# Patient Record
Sex: Female | Born: 1945
Health system: Southern US, Community
[De-identification: ages and names within clinical notes are randomized; demographics above are authoritative.]

## PROBLEM LIST (undated history)

## (undated) DIAGNOSIS — M8580 Other specified disorders of bone density and structure, unspecified site: Secondary | ICD-10-CM

## (undated) DIAGNOSIS — M1990 Unspecified osteoarthritis, unspecified site: Secondary | ICD-10-CM

## (undated) DIAGNOSIS — N649 Disorder of breast, unspecified: Secondary | ICD-10-CM

## (undated) DIAGNOSIS — D126 Benign neoplasm of colon, unspecified: Secondary | ICD-10-CM

## (undated) DIAGNOSIS — I341 Nonrheumatic mitral (valve) prolapse: Secondary | ICD-10-CM

## (undated) HISTORY — PX: TONSILLECTOMY: SUR1361

## (undated) HISTORY — PX: RECTOCELE REPAIR: SHX761

## (undated) HISTORY — DX: Disorder of breast, unspecified: N64.9

## (undated) HISTORY — PX: ANTERIOR AND POSTERIOR VAGINAL REPAIR: SUR5

## (undated) HISTORY — DX: Unspecified osteoarthritis, unspecified site: M19.90

## (undated) HISTORY — DX: Other specified disorders of bone density and structure, unspecified site: M85.80

## (undated) HISTORY — DX: Nonrheumatic mitral (valve) prolapse: I34.1

## (undated) HISTORY — DX: Benign neoplasm of colon, unspecified: D12.6

## (undated) HISTORY — PX: ABDOMINAL HYSTERECTOMY: SHX81

## (undated) HISTORY — PX: CATARACT EXTRACTION: SUR2

## (undated) HISTORY — PX: BREAST LUMPECTOMY: SHX2

---

## 1997-12-25 ENCOUNTER — Ambulatory Visit (HOSPITAL_COMMUNITY): Admission: RE | Admit: 1997-12-25 | Discharge: 1997-12-25 | Payer: Self-pay | Admitting: Internal Medicine

## 1998-11-30 ENCOUNTER — Encounter: Payer: Self-pay | Admitting: Internal Medicine

## 1998-11-30 ENCOUNTER — Ambulatory Visit (HOSPITAL_COMMUNITY): Admission: RE | Admit: 1998-11-30 | Discharge: 1998-11-30 | Payer: Self-pay | Admitting: Internal Medicine

## 1999-04-19 HISTORY — PX: TOTAL ABDOMINAL HYSTERECTOMY W/ BILATERAL SALPINGOOPHORECTOMY: SHX83

## 2000-04-18 HISTORY — PX: COLONOSCOPY: SHX174

## 2000-07-12 ENCOUNTER — Other Ambulatory Visit: Admission: RE | Admit: 2000-07-12 | Discharge: 2000-07-12 | Payer: Self-pay | Admitting: Family Medicine

## 2001-11-22 ENCOUNTER — Encounter: Payer: Self-pay | Admitting: Family Medicine

## 2001-11-22 ENCOUNTER — Encounter: Admission: RE | Admit: 2001-11-22 | Discharge: 2001-11-22 | Payer: Self-pay | Admitting: Family Medicine

## 2002-06-16 ENCOUNTER — Encounter: Payer: Self-pay | Admitting: Emergency Medicine

## 2002-06-16 ENCOUNTER — Emergency Department (HOSPITAL_COMMUNITY): Admission: EM | Admit: 2002-06-16 | Discharge: 2002-06-16 | Payer: Self-pay | Admitting: Emergency Medicine

## 2003-08-04 ENCOUNTER — Other Ambulatory Visit: Admission: RE | Admit: 2003-08-04 | Discharge: 2003-08-04 | Payer: Self-pay | Admitting: Obstetrics and Gynecology

## 2004-07-03 ENCOUNTER — Ambulatory Visit: Payer: Self-pay | Admitting: Family Medicine

## 2005-01-24 ENCOUNTER — Emergency Department (HOSPITAL_COMMUNITY): Admission: EM | Admit: 2005-01-24 | Discharge: 2005-01-24 | Payer: Self-pay | Admitting: Emergency Medicine

## 2005-04-08 ENCOUNTER — Ambulatory Visit: Payer: Self-pay | Admitting: Internal Medicine

## 2005-06-04 ENCOUNTER — Ambulatory Visit: Payer: Self-pay | Admitting: Family Medicine

## 2005-09-10 ENCOUNTER — Ambulatory Visit: Payer: Self-pay | Admitting: Family Medicine

## 2005-09-28 ENCOUNTER — Ambulatory Visit: Payer: Self-pay | Admitting: Internal Medicine

## 2006-02-11 ENCOUNTER — Ambulatory Visit: Payer: Self-pay | Admitting: Internal Medicine

## 2006-02-11 ENCOUNTER — Ambulatory Visit (HOSPITAL_COMMUNITY): Admission: RE | Admit: 2006-02-11 | Discharge: 2006-02-11 | Payer: Self-pay | Admitting: Internal Medicine

## 2006-02-27 ENCOUNTER — Ambulatory Visit: Payer: Self-pay | Admitting: Internal Medicine

## 2006-06-17 ENCOUNTER — Ambulatory Visit: Payer: Self-pay | Admitting: Internal Medicine

## 2006-11-27 ENCOUNTER — Encounter: Payer: Self-pay | Admitting: Internal Medicine

## 2006-12-20 ENCOUNTER — Ambulatory Visit: Payer: Self-pay | Admitting: Internal Medicine

## 2006-12-22 ENCOUNTER — Encounter (INDEPENDENT_AMBULATORY_CARE_PROVIDER_SITE_OTHER): Payer: Self-pay | Admitting: *Deleted

## 2006-12-22 LAB — CONVERTED CEMR LAB
AST: 18 units/L (ref 0–37)
Basophils Absolute: 0 10*3/uL (ref 0.0–0.1)
Bilirubin, Direct: 0.1 mg/dL (ref 0.0–0.3)
Cholesterol: 188 mg/dL (ref 0–200)
Eosinophils Absolute: 0.2 10*3/uL (ref 0.0–0.6)
Eosinophils Relative: 3.5 % (ref 0.0–5.0)
GFR calc Af Amer: 94 mL/min
GFR calc non Af Amer: 78 mL/min
Glucose, Bld: 87 mg/dL (ref 70–99)
HCT: 40.4 % (ref 36.0–46.0)
Lymphocytes Relative: 37.7 % (ref 12.0–46.0)
MCHC: 34.1 g/dL (ref 30.0–36.0)
MCV: 89.5 fL (ref 78.0–100.0)
Monocytes Absolute: 0.5 10*3/uL (ref 0.2–0.7)
Neutro Abs: 2.8 10*3/uL (ref 1.4–7.7)
Neutrophils Relative %: 49.9 % (ref 43.0–77.0)
Potassium: 4.1 meq/L (ref 3.5–5.1)
Sodium: 144 meq/L (ref 135–145)
TSH: 1.05 microintl units/mL (ref 0.35–5.50)
WBC: 5.6 10*3/uL (ref 4.5–10.5)

## 2007-03-23 ENCOUNTER — Ambulatory Visit: Payer: Self-pay | Admitting: Internal Medicine

## 2007-03-23 ENCOUNTER — Encounter (INDEPENDENT_AMBULATORY_CARE_PROVIDER_SITE_OTHER): Payer: Self-pay | Admitting: *Deleted

## 2007-04-09 ENCOUNTER — Ambulatory Visit: Payer: Self-pay | Admitting: Internal Medicine

## 2007-04-28 ENCOUNTER — Ambulatory Visit: Payer: Self-pay | Admitting: Family Medicine

## 2007-05-07 ENCOUNTER — Ambulatory Visit: Payer: Self-pay | Admitting: Internal Medicine

## 2007-11-14 ENCOUNTER — Ambulatory Visit: Payer: Self-pay | Admitting: Internal Medicine

## 2007-11-28 ENCOUNTER — Encounter: Payer: Self-pay | Admitting: Internal Medicine

## 2008-07-14 ENCOUNTER — Ambulatory Visit: Payer: Self-pay | Admitting: Internal Medicine

## 2008-10-09 ENCOUNTER — Telehealth (INDEPENDENT_AMBULATORY_CARE_PROVIDER_SITE_OTHER): Payer: Self-pay | Admitting: *Deleted

## 2008-11-03 ENCOUNTER — Ambulatory Visit: Payer: Self-pay | Admitting: Internal Medicine

## 2008-11-03 DIAGNOSIS — N959 Unspecified menopausal and perimenopausal disorder: Secondary | ICD-10-CM | POA: Insufficient documentation

## 2008-11-03 DIAGNOSIS — M1990 Unspecified osteoarthritis, unspecified site: Secondary | ICD-10-CM | POA: Insufficient documentation

## 2008-11-09 LAB — CONVERTED CEMR LAB
AST: 15 units/L (ref 0–37)
BUN: 20 mg/dL (ref 6–23)
Basophils Absolute: 0 10*3/uL (ref 0.0–0.1)
Cholesterol: 204 mg/dL — ABNORMAL HIGH (ref 0–200)
Creatinine, Ser: 0.7 mg/dL (ref 0.4–1.2)
Direct LDL: 118.1 mg/dL
GFR calc non Af Amer: 89.86 mL/min (ref >60)
Glucose, Bld: 81 mg/dL (ref 70–99)
HCT: 40.1 % (ref 36.0–46.0)
HDL: 71.3 mg/dL (ref >39.00)
Lymphs Abs: 2 10*3/uL (ref 0.7–4.0)
Monocytes Absolute: 0.4 10*3/uL (ref 0.1–1.0)
Monocytes Relative: 8.1 % (ref 3.0–12.0)
Platelets: 206 10*3/uL (ref 150.0–400.0)
Potassium: 3.7 meq/L (ref 3.5–5.1)
RDW: 13.1 % (ref 11.5–14.6)
TSH: 0.87 microintl units/mL (ref 0.35–5.50)
Total Bilirubin: 0.9 mg/dL (ref 0.3–1.2)
Triglycerides: 68 mg/dL (ref 0.0–149.0)

## 2008-11-11 ENCOUNTER — Encounter: Payer: Self-pay | Admitting: Internal Medicine

## 2008-11-14 ENCOUNTER — Telehealth (INDEPENDENT_AMBULATORY_CARE_PROVIDER_SITE_OTHER): Payer: Self-pay | Admitting: *Deleted

## 2008-11-17 ENCOUNTER — Encounter (INDEPENDENT_AMBULATORY_CARE_PROVIDER_SITE_OTHER): Payer: Self-pay | Admitting: *Deleted

## 2008-12-01 ENCOUNTER — Encounter: Payer: Self-pay | Admitting: Internal Medicine

## 2008-12-03 ENCOUNTER — Telehealth (INDEPENDENT_AMBULATORY_CARE_PROVIDER_SITE_OTHER): Payer: Self-pay | Admitting: *Deleted

## 2008-12-21 ENCOUNTER — Emergency Department (HOSPITAL_COMMUNITY): Admission: EM | Admit: 2008-12-21 | Discharge: 2008-12-21 | Payer: Self-pay | Admitting: Emergency Medicine

## 2008-12-23 ENCOUNTER — Telehealth (INDEPENDENT_AMBULATORY_CARE_PROVIDER_SITE_OTHER): Payer: Self-pay | Admitting: *Deleted

## 2009-04-08 ENCOUNTER — Ambulatory Visit: Payer: Self-pay | Admitting: Internal Medicine

## 2009-04-12 LAB — CONVERTED CEMR LAB: Vit D, 25-Hydroxy: 24 ng/mL — ABNORMAL LOW (ref 30–89)

## 2009-04-13 ENCOUNTER — Encounter (INDEPENDENT_AMBULATORY_CARE_PROVIDER_SITE_OTHER): Payer: Self-pay | Admitting: *Deleted

## 2009-04-24 ENCOUNTER — Telehealth: Payer: Self-pay | Admitting: Internal Medicine

## 2009-04-24 ENCOUNTER — Ambulatory Visit: Payer: Self-pay | Admitting: Family

## 2009-04-24 DIAGNOSIS — J029 Acute pharyngitis, unspecified: Secondary | ICD-10-CM

## 2009-04-24 DIAGNOSIS — L989 Disorder of the skin and subcutaneous tissue, unspecified: Secondary | ICD-10-CM | POA: Insufficient documentation

## 2009-04-27 ENCOUNTER — Encounter (INDEPENDENT_AMBULATORY_CARE_PROVIDER_SITE_OTHER): Payer: Self-pay | Admitting: *Deleted

## 2009-05-05 ENCOUNTER — Encounter: Payer: Self-pay | Admitting: Internal Medicine

## 2009-12-07 ENCOUNTER — Encounter: Payer: Self-pay | Admitting: Internal Medicine

## 2010-05-20 NOTE — Progress Notes (Signed)
Summary: COUGH  Phone Note Call from Patient Call back at Work Phone [phone]   Caller: Patient Call For: Marga Melnick MD Reason for Call: Talk to Nurse Summary of Call: PATIENT CALLING, C/O OF COUGHING, CONGESTION, LOSS OF VOICE, LEFT EAR PAIN, NO FEVER.  HAS HAD SYMPTOMS FOR 1 WEEK.  PATIENT WAS OFFERED AN APPT W/MELISSA O'SULLIVAN AT THE [facility] THIS AFTERNOON, BUT STATES THAT IS TOO FAR FOR HER TO GO.  SHE HAS TRIED OTC MEDS WITH NO RELIEF.  PATIENT REQUESTS CALL FROM NURSE.  PLEASE CALL PATIENT @ WORK ([employer] - [phone]). Initial call taken by: Magdalen Spatz [facility],  April 24, 2009 8:31 AM  Follow-up for Phone Call        pt coming in for OV today and advise if symptoms worsen U/C or she can be seen at [facility] clinic. pt ok.....................Marland KitchenFelecia Deloach CMA  April 24, 2009 8:43 AM

## 2010-05-20 NOTE — Assessment & Plan Note (Signed)
Summary: cough, congestion//fd   Vital Signs:  Patient profile:   65 year old female Weight:      123 pounds O2 Sat:      97 % on Room air Temp:     98.3 degrees F oral Pulse rate:   85 / minute BP sitting:   101 / 70  (left arm)  Vitals Entered By: Doristine Devoid (April 24, 2009 4:04 PM)  O2 Flow:  Room air CC: cough and chest congestion along w/ some sinus drainage   CC:  cough and chest congestion along w/ some sinus drainage.  History of Present Illness: Mikayla Washington is a 65 year old female who presents today with c/o nasal congestion/sinus drainage and cough.  These symptoms started 6 days ago.  Has tried OTC theraflu, OTC cough medicine.  These measured have not helped.  Allergies: No Known Drug Allergies  Review of Systems       notes low grade temp early this week at about 100, + left ear pain, + laryngitis  Physical Exam  General:  hoarse voice,  thin white female, NAD Eyes:  PERRLA Ears:  + lesion noted in left ear canal no occulsive Mouth:  Oral mucosa and oropharynx without lesions or exudates.  Teeth in good repair. Neck:  No deformities, masses, or tenderness noted. Lungs:  Normal respiratory effort, chest expands symmetrically. Lungs are clear to auscultation, no crackles or wheezes. Heart:  Normal rate and regular rhythm. S1 and S2 normal without gallop, murmur, click, rub or other extra sounds.   Impression & Recommendations:  Problem # 1:  ACUTE BRONCHITIS (ICD-466.0) Assessment New Will treat for bronchitis.  Lungs clear to auscultation.  If symptoms worsen or do not improve will need CXR. Her updated medication list for this problem includes:    Zithromax Z-pak 250 Mg Tabs (Azithromycin) .Marland Kitchen..Marland Kitchen Two tablets by mouth x 1 today, then one tablet daily for 4 more days  Problem # 2:  SKIN LESION (ICD-709.9) Assessment: New  Patient with lesion in left ear canal- will plan to refer to ENT for further evaluation  Orders: ENT Referral (ENT)  Complete  Medication List: 1)  Centrum Silver  2)  Vitamin D3 50000 Unit Caps (cholecalciferol)  .Marland Kitchen.. 1 pill once weekly 3)  Zithromax Z-pak 250 Mg Tabs (Azithromycin) .... Two tablets by mouth x 1 today, then one tablet daily for 4 more days  Patient Instructions: 1)  Please call if you develop fever over 101,  if your symptoms worsen or if they do not improve. 2)  You will be contacted about your referral to ENT.   Prescriptions: ZITHROMAX Z-PAK 250 MG TABS (AZITHROMYCIN) two tablets by mouth x 1 today, then one tablet daily for 4 more days  #1 pack x 0   Entered and Authorized by:   Lemont Fillers FNP   Signed by:   Lemont Fillers FNP on 04/24/2009   Method used:   Electronically to        [facility]  [address] [id number]       [address]       [place], [place]  [address]       Ph: [phone] or [phone]       Fax: [phone]   RxID:   [id number]

## 2010-05-20 NOTE — Letter (Signed)
Summary: Primary Care Consult Scheduled Letter  [place] [facility]  [address]   [place], [place] [address]   Phone: [phone]  Fax: [phone]      04/27/2009 MRN: [id number]  Mikayla Washington [address] [place], [place]  [address]    Dear Ms. Pirozzi,      We have scheduled an appointment for you.  At the recommendation of Sandford Craze NP, we have scheduled you a consult with [facility],Dr Pollyann Kennedy on January 18,2011 at 3:40pm .  Their address [address]  [place] . The office phone number is [phone].  If this appointment day and time is not convenient for you, please feel free to call the office of the doctor you are being referred to at the number listed above and reschedule the appointment.     It is important for you to keep your scheduled appointments. We are here to make sure you are given good patient care. If you have questions or you have made changes to your appointment, please notify us at  [phone], ask for [facility].    Thank you,  Patient Care Coordinator [place] at [facility]

## 2010-05-20 NOTE — Consult Note (Signed)
Summary: [facility]  [facility]   Imported By: Lanelle Bal 05/12/2009 09:00:29  _____________________________________________________________________  External Attachment:    Type:   Image     Comment:   External Document

## 2010-12-16 ENCOUNTER — Encounter: Payer: Self-pay | Admitting: Internal Medicine

## 2011-02-25 ENCOUNTER — Encounter: Payer: Self-pay | Admitting: Internal Medicine

## 2011-02-28 ENCOUNTER — Encounter: Payer: Self-pay | Admitting: Internal Medicine

## 2011-02-28 ENCOUNTER — Telehealth: Payer: Self-pay | Admitting: *Deleted

## 2011-02-28 ENCOUNTER — Ambulatory Visit (INDEPENDENT_AMBULATORY_CARE_PROVIDER_SITE_OTHER): Payer: BC Managed Care – PPO | Admitting: Internal Medicine

## 2011-02-28 VITALS — BP 100/68 | HR 84 | Temp 98.5°F | Resp 12 | Ht 64.5 in | Wt 118.0 lb

## 2011-02-28 DIAGNOSIS — Z0000 Encounter for general adult medical examination without abnormal findings: Secondary | ICD-10-CM

## 2011-02-28 DIAGNOSIS — Z23 Encounter for immunization: Secondary | ICD-10-CM

## 2011-02-28 DIAGNOSIS — J310 Chronic rhinitis: Secondary | ICD-10-CM

## 2011-02-28 MED ORDER — FLUTICASONE PROPIONATE 50 MCG/ACT NA SUSP: 1.0000 | Freq: Every day | NASAL | Status: DC

## 2011-02-28 NOTE — Patient Instructions (Signed)
Preventive Health Care: Exercise  30-45  minutes a day, 3-4 days a week. Walking is especially valuable in preventing Osteoporosis. Eat a low-fat diet with lots of fruits and vegetables, up to 7-9 servings per day. Consume less than 30 grams of sugar per day from foods & drinks with High Fructose Corn Syrup as # 1,2,3 or #4 on label. Generic Flonase 1 spray in each nostril twice a day as needed. Use the "crossover" technique as discussed  Plain Mucinex for thick secretions ;force NON dairy fluids . Use a Neti pot daily as needed for sinus congestion

## 2011-02-28 NOTE — Telephone Encounter (Signed)
Dr Alwyn Ren called Dr Jarold Motto to inform him pt needs a recall COLON- last one in [place] 08/01/10. Spoke with pt who stated she's having no problems and can she do it in June when school is out. Reminder in system to call pt  Mid April for a mid June COLON.

## 2011-02-28 NOTE — Progress Notes (Signed)
  Subjective:    Patient ID: Mikayla Washington, female    DOB: 1945-09-16, 65 y.o.   MRN: [id number]  HPI  [name]  is here for a physical;acute issues include nasal congestion during school year.      Review of Systems  The major and minor symptoms of rhinosinusitis were reviewed. At present she denies nasal congestion/obstruction; nasal purulence; facial pain; anosmia; fatigue; fever; headache; halitosis; earache and dental pain. As needed  Treatment OTC "cold meds" .      Objective:   Physical Exam Gen.: Thin but healthy and well-nourished in appearance. Alert, appropriate and cooperative throughout exam. Head: Normocephalic without obvious abnormalities Eyes: No corneal or conjunctival inflammation noted. Pupils equal round reactive to light and accommodation. Fundal exam is benign without hemorrhages, exudate, papilledema. Extraocular motion intact. Vision grossly normal. Ears: External  ear exam reveals no significant lesions or deformities. Canals clear .TMs normal. Hearing is grossly normal bilaterally. Nose: External nasal exam reveals no deformity or inflammation. Nasal mucosa are pink and moist. No lesions or exudates noted.  Mouth: Oral mucosa and oropharynx reveal no lesions or exudates. Teeth in good repair. Neck: No deformities, masses, or tenderness noted. Range of motion & . Thyroid normal. Lungs: Normal respiratory effort; chest expands symmetrically. Lungs are clear to auscultation without rales, wheezes, or increased work of breathing. Heart: Normal rate and rhythm. Normal S1 and S2. No gallop, click, or rub. S 4 w/o  murmur. Abdomen: Bowel sounds normal; abdomen soft and nontender. No masses, organomegaly or hernias noted. She has an aortic bruit; there is no aortic enlargement or aneurysm present. Genitalia: Dr Annabell Howells   .                                                                                   Musculoskeletal/extremities: No deformity or scoliosis noted of  the thoracic or  lumbar spine. No clubbing, cyanosis, edema, or deformity noted. Range of motion  normal .Tone & strength  normal.Joints normal. Nail health  good. Vascular: Carotid, radial artery, dorsalis pedis and  posterior tibial pulses are full and equal. No bruits present. Neurologic: Alert and oriented x3. Deep tendon reflexes symmetrical and normal.         Skin: Intact without suspicious lesions or rashes. Lymph: No cervical, axillary lymphadenopathy present. Psych: Mood and affect are normal. Normally interactive                                                                                         Assessment & Plan:  #1 comprehensive physical exam; no acute findings #2 see Problem List with Assessments & Recommendations  #3 rhinitis, seasonal or possibly environmental. See orders and recommendations Plan: see Orders  EKG: initial slow rise R wave V 1- V 2, stable.

## 2011-03-01 LAB — HEPATIC FUNCTION PANEL
AST: 17 U/L (ref 0–37)
Albumin: 4 g/dL (ref 3.5–5.2)
Total Protein: 6.6 g/dL (ref 6.0–8.3)

## 2011-03-01 LAB — LIPID PANEL
HDL: 65.1 mg/dL (ref >39.00)
Total CHOL/HDL Ratio: 2
VLDL: 7.2 mg/dL (ref 0.0–40.0)

## 2011-03-01 LAB — CBC WITH DIFFERENTIAL/PLATELET
Basophils Absolute: 0 10*3/uL (ref 0.0–0.1)
Basophils Relative: 0.3 % (ref 0.0–3.0)
Hemoglobin: 12.9 g/dL (ref 12.0–15.0)
Lymphocytes Relative: 29.6 % (ref 12.0–46.0)
Monocytes Relative: 6.1 % (ref 3.0–12.0)
Neutro Abs: 3.7 10*3/uL (ref 1.4–7.7)
RBC: 4.29 Mil/uL (ref 3.87–5.11)

## 2011-03-01 LAB — TSH: TSH: 0.75 u[IU]/mL (ref 0.35–5.50)

## 2011-03-01 LAB — BASIC METABOLIC PANEL
Calcium: 8.9 mg/dL (ref 8.4–10.5)
GFR: 95.47 mL/min (ref >60.00)
Sodium: 143 mEq/L (ref 135–145)

## 2011-04-19 DIAGNOSIS — D126 Benign neoplasm of colon, unspecified: Secondary | ICD-10-CM

## 2011-04-19 HISTORY — DX: Benign neoplasm of colon, unspecified: D12.6

## 2011-04-19 HISTORY — PX: OTHER SURGICAL HISTORY: SHX169

## 2011-06-11 ENCOUNTER — Encounter: Payer: Self-pay | Admitting: Internal Medicine

## 2011-06-11 ENCOUNTER — Ambulatory Visit (INDEPENDENT_AMBULATORY_CARE_PROVIDER_SITE_OTHER): Payer: BC Managed Care – PPO | Admitting: Internal Medicine

## 2011-06-11 VITALS — BP 112/62 | HR 82 | Temp 98.5°F | Ht 65.0 in | Wt 122.0 lb

## 2011-06-11 DIAGNOSIS — J040 Acute laryngitis: Secondary | ICD-10-CM

## 2011-06-11 MED ORDER — AMOXICILLIN 500 MG PO TABS: 1000.0000 mg | ORAL_TABLET | Freq: Two times a day (BID) | ORAL | Status: AC

## 2011-06-11 NOTE — Progress Notes (Signed)
  Subjective:    Patient ID: Mikayla Washington, female    DOB: 08/28/1945, 66 y.o.   MRN: [id number]  HPI Has been sick for about a week Ear and throat pain Increasing pain with swallowing Voice is really affected OTC cold meds give some relief Not improving  Some cough---occ mucus (clear) Some fever earlier in week---gone now Some chills this AM. No sweats SOme SOB---if she exerts herself  Current Outpatient Prescriptions on File Prior to Visit  Medication Sig Dispense Refill  . calcium gluconate 500 MG tablet Take 500 mg by mouth daily.        . Cholecalciferol (VITAMIN D3) 50000 UNITS CAPS Take by mouth once a week.        . fluticasone (FLONASE) 50 MCG/ACT nasal spray Place 1 spray into the nose daily.  16 g  11  . Multiple Vitamins-Minerals (CENTRUM SILVER PO) Take by mouth.          No Known Allergies  Past Medical History  Diagnosis Date  . DJD (degenerative joint disease)   . Breast disease     fibrocystic    Past Surgical History  Procedure Date  . Tonsillectomy   . Breast lumpectomy     X 3  . Total abdominal hysterectomy w/ bilateral salpingoophorectomy 2001     benign ovarian tumor  . Colonoscopy 2002    Dr Jarold Motto, negative    Family History  Problem Relation Age of Onset  . Adopted: Yes  .       History   Social History  . Marital Status: Married    Spouse Name: N/A    Number of Children: N/A  . Years of Education: N/A   Occupational History  . Not on file.   Social History Main Topics  . Smoking status: Never Smoker   . Smokeless tobacco: Not on file  . Alcohol Use: No  . Drug Use: No  . Sexually Active: Not on file   Other Topics Concern  . Not on file   Social History Narrative  . 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  No distress.  HENT:       No sinus tenderness Moderate nasal congestion Right TM normal Left mostly obscured with cerumen but looks okay  Neck: Normal range of motion. Neck supple. No thyromegaly present.  Pulmonary/Chest: Effort normal and breath sounds normal. No respiratory distress. She has no wheezes. She has no rales.  Lymphadenopathy:    She has no cervical adenopathy.  Skin: No rash noted.          Assessment & Plan:

## 2011-06-11 NOTE — Patient Instructions (Signed)
Please start the amoxicillin antibiotic if you are worsening over the next few days--instead of improving

## 2011-06-11 NOTE — Assessment & Plan Note (Signed)
probably viral  No clear evidence of secondary bacterial infection Discussed supportive care Amoxil if worsens

## 2011-08-10 ENCOUNTER — Telehealth: Payer: Self-pay | Admitting: *Deleted

## 2011-08-10 NOTE — Telephone Encounter (Signed)
lmom for pt to call to back to schedule her COLON.

## 2011-08-10 NOTE — Telephone Encounter (Signed)
Message copied by Florene Glen on Wed Aug 10, 2011  2:16 PM ------      Message from: Graciella Freer K      Created: Mon Feb 28, 2011  2:48 PM       Remind pt she needs a recall colon in mid june

## 2011-08-12 NOTE — Telephone Encounter (Signed)
Scheduled with pt

## 2011-08-20 ENCOUNTER — Encounter: Payer: Self-pay | Admitting: Family Medicine

## 2011-08-20 ENCOUNTER — Ambulatory Visit (INDEPENDENT_AMBULATORY_CARE_PROVIDER_SITE_OTHER): Payer: BC Managed Care – PPO | Admitting: Family Medicine

## 2011-08-20 VITALS — BP 100/70 | Temp 98.2°F | Wt 113.0 lb

## 2011-08-20 DIAGNOSIS — S93409A Sprain of unspecified ligament of unspecified ankle, initial encounter: Secondary | ICD-10-CM

## 2011-08-20 DIAGNOSIS — J329 Chronic sinusitis, unspecified: Secondary | ICD-10-CM

## 2011-08-20 DIAGNOSIS — J209 Acute bronchitis, unspecified: Secondary | ICD-10-CM

## 2011-08-20 DIAGNOSIS — S96919A Strain of unspecified muscle and tendon at ankle and foot level, unspecified foot, initial encounter: Secondary | ICD-10-CM

## 2011-08-20 MED ORDER — AMOXICILLIN-POT CLAVULANATE 875-125 MG PO TABS: 1.0000 | ORAL_TABLET | Freq: Two times a day (BID) | ORAL | Status: AC

## 2011-08-20 MED ORDER — HYDROCOD POLST-CHLORPHEN POLST 10-8 MG/5ML PO LQCR: 5.0000 mL | Freq: Every evening | ORAL | Status: DC | PRN

## 2011-08-20 MED ORDER — BENZONATATE 100 MG PO CAPS: 100.0000 mg | ORAL_CAPSULE | Freq: Two times a day (BID) | ORAL | Status: AC | PRN

## 2011-08-20 NOTE — Patient Instructions (Signed)
Bronchitis Bronchitis is the body's way of reacting to injury and/or infection (inflammation) of the bronchi. Bronchi are the air tubes that extend from the windpipe into the lungs. If the inflammation becomes severe, it may cause shortness of breath. CAUSES  Inflammation may be caused by:  A virus.   Germs (bacteria).   Dust.   Allergens.   Pollutants and many other irritants.  The cells lining the bronchial tree are covered with tiny hairs (cilia). These constantly beat upward, away from the lungs, toward the mouth. This keeps the lungs free of pollutants. When these cells become too irritated and are unable to do their job, mucus begins to develop. This causes the characteristic cough of bronchitis. The cough clears the lungs when the cilia are unable to do their job. Without either of these protective mechanisms, the mucus would settle in the lungs. Then you would develop pneumonia. Smoking is a common cause of bronchitis and can contribute to pneumonia. Stopping this habit is the single most important thing you can do to help yourself. TREATMENT   Your caregiver may prescribe an antibiotic if the cough is caused by bacteria. Also, medicines that open up your airways make it easier to breathe. Your caregiver may also recommend or prescribe an expectorant. It will loosen the mucus to be coughed up. Only take over-the-counter or prescription medicines for pain, discomfort, or fever as directed by your caregiver.   Removing whatever causes the problem (smoking, for example) is critical to preventing the problem from getting worse.   Cough suppressants may be prescribed for relief of cough symptoms.   Inhaled medicines may be prescribed to help with symptoms now and to help prevent problems from returning.   For those with recurrent (chronic) bronchitis, there may be a need for steroid medicines.  SEEK IMMEDIATE MEDICAL CARE IF:   During treatment, you develop more pus-like mucus  (purulent sputum).   You have a fever.   Your baby is older than 3 months with a rectal temperature of 102 F (38.9 C) or higher.   Your baby is 60 months old or younger with a rectal temperature of 100.4 F (38 C) or higher.   You become progressively more ill.   You have increased difficulty breathing, wheezing, or shortness of breath.  It is necessary to seek immediate medical care if you are elderly or sick from any other disease. MAKE SURE YOU:   Understand these instructions.   Will watch your condition.   Will get help right away if you are not doing well or get worse.  Document Released: 04/04/2005 Document Revised: 03/24/2011 Document Reviewed: 02/12/2008 [facility] Patient Information 2012 [place], [place].   Consider a probiotic such as Align caps daily, a generic is fine  For the foot, rest, ice, elevation, Aspercreme, wrap, if no improvement return for xray

## 2011-08-21 DIAGNOSIS — S96919A Strain of unspecified muscle and tendon at ankle and foot level, unspecified foot, initial encounter: Secondary | ICD-10-CM | POA: Insufficient documentation

## 2011-08-21 NOTE — Assessment & Plan Note (Signed)
Base of 5th metatarsal hurts with weight bearing. She is encouraged to keep wrapping, apply cie and Aspercreme bid and report if symptoms persist

## 2011-08-21 NOTE — Assessment & Plan Note (Signed)
Augmentin bid and Tussionex qhs prn cough. May use Tessalon Perles bid during the day. Push clear fluids, Mucinex bid and increase rest

## 2011-08-21 NOTE — Progress Notes (Signed)
Patient ID: Mikayla Washington, female   DOB: [birth year], 66 y.o.   MRN: [id number] ELIANAH KARIS [id number] 12/18/1945 08/21/2011      Progress Note-Follow Up  Subjective  Chief Complaint  Chief Complaint  Patient presents with  . Cough    ear pain    HPI  Patient is a 66 year old female who is in today with complaints of generalized headache, fevers, chills, fatigue, malaise, right ear pain, cough, congestion. She has been feeling poorly for roughly a week now. Has continued to work but continues to worsen. Has not taken any medications thus far. She's also complaining of some right ankle pain. 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 No swelling erythema or ecchymosis. Pain is not constant but is instead is positional  Past Medical History  Diagnosis Date  . DJD (degenerative joint disease)   . Breast disease     fibrocystic    Past Surgical History  Procedure Date  . Tonsillectomy   . Breast lumpectomy     X 3  . Total abdominal hysterectomy w/ bilateral salpingoophorectomy 2001     benign ovarian tumor  . Colonoscopy 2002    Dr Jarold Motto, negative    Family History  Problem Relation Age of Onset  . Adopted: Yes  .       History   Social History  . Marital Status: Married    Spouse Name: N/A    Number of Children: N/A  . Years of Education: N/A   Occupational History  . Not on file.   Social History Main Topics  . Smoking status: Never Smoker   . Smokeless tobacco: Not on file  . Alcohol Use: No  . Drug Use: No  . Sexually Active: Not on file   Other Topics Concern  . Not on file   Social History Narrative  . No narrative on file    Current Outpatient Prescriptions on File Prior to Visit  Medication Sig Dispense Refill  . calcium gluconate 500 MG tablet Take 500 mg by mouth daily.        . Cholecalciferol (VITAMIN D3) 50000 UNITS CAPS Take  by mouth once a week.        . Multiple Vitamins-Minerals (CENTRUM SILVER PO) Take by mouth.        . fluticasone (FLONASE) 50 MCG/ACT nasal spray Place 1 spray into the nose daily.  16 g  11    No Known Allergies  Review of Systems  Review of Systems  Constitutional: Positive for fever, chills and malaise/fatigue.  HENT: Positive for ear pain, congestion and sore throat.   Eyes: Negative for discharge.  Respiratory: Positive for cough and sputum production. Negative for shortness of breath.   Cardiovascular: Negative for chest pain, palpitations and leg swelling.  Gastrointestinal: Negative for nausea, abdominal pain and diarrhea.  Genitourinary: Negative for dysuria.  Musculoskeletal: Negative for falls.  Skin: Negative for rash.  Neurological: Positive for dizziness and headaches. Negative for loss of consciousness.  Endo/Heme/Allergies: Negative for polydipsia.  Psychiatric/Behavioral: Negative for depression and suicidal ideas. The patient is not nervous/anxious and does not have insomnia.     Objective  BP 100/70  Temp(Src) 98.2 F (36.8 C) (Oral)  Wt 113 lb (51.256 kg)  Physical Exam  Physical Exam  Constitutional: She is oriented to person, place, and time and well-developed, well-nourished, and  in no distress. No distress.  HENT:  Head: Normocephalic and atraumatic.       TMs erythematous and dull, nasal mucosa boggy and erythematous  Eyes: Conjunctivae are normal.  Neck: Neck supple. No thyromegaly present.  Cardiovascular: Normal rate, regular rhythm and normal heart sounds.   No murmur heard. Pulmonary/Chest: Effort normal and breath sounds normal. She has no wheezes.  Abdominal: She exhibits no distension and no mass.  Musculoskeletal: She exhibits no edema.  Lymphadenopathy:    She has cervical adenopathy.  Neurological: She is alert and oriented to person, place, and time.  Skin: Skin is warm and dry. No rash noted. She is not diaphoretic.  Psychiatric:  Memory, affect and judgment normal.    Lab Results  Component Value Date   TSH 0.75 02/28/2011   Lab Results  Component Value Date   WBC 6.0 02/28/2011   HGB 12.9 02/28/2011   HCT 38.8 02/28/2011   MCV 90.5 02/28/2011   PLT 212.0 02/28/2011   Lab Results  Component Value Date   CREATININE 0.7 02/28/2011   BUN 20 02/28/2011   NA 143 02/28/2011   K 4.0 02/28/2011   CL 107 02/28/2011   CO2 27 02/28/2011   Lab Results  Component Value Date   ALT 12 02/28/2011   AST 17 02/28/2011   ALKPHOS 79 02/28/2011   BILITOT 0.6 02/28/2011   Lab Results  Component Value Date   CHOL 159 02/28/2011   Lab Results  Component Value Date   HDL 65.10 02/28/2011   Lab Results  Component Value Date   LDLCALC 87 02/28/2011   Lab Results  Component Value Date   TRIG 36.0 02/28/2011   Lab Results  Component Value Date   CHOLHDL 2 02/28/2011     Assessment & Plan  ACUTE BRONCHITIS Augmentin bid and Tussionex qhs prn cough. May use Tessalon Perles bid during the day. Push clear fluids, Mucinex bid and increase rest  Ankle strain Base of 5th metatarsal hurts with weight bearing. She is encouraged to keep wrapping, apply cie and Aspercreme bid and report if symptoms persist

## 2011-10-03 ENCOUNTER — Ambulatory Visit (AMBULATORY_SURGERY_CENTER): Payer: BC Managed Care – PPO | Admitting: *Deleted

## 2011-10-03 ENCOUNTER — Encounter: Payer: Self-pay | Admitting: Gastroenterology

## 2011-10-03 VITALS — Ht 63.75 in | Wt 118.5 lb

## 2011-10-03 DIAGNOSIS — Z1211 Encounter for screening for malignant neoplasm of colon: Secondary | ICD-10-CM

## 2011-10-03 MED ORDER — MOVIPREP 100 G PO SOLR: 1.0000 | Freq: Once | ORAL | Status: DC

## 2011-10-10 ENCOUNTER — Encounter: Payer: BC Managed Care – PPO | Admitting: Gastroenterology

## 2011-10-17 ENCOUNTER — Ambulatory Visit (AMBULATORY_SURGERY_CENTER): Payer: BC Managed Care – PPO | Admitting: Gastroenterology

## 2011-10-17 ENCOUNTER — Encounter: Payer: Self-pay | Admitting: Gastroenterology

## 2011-10-17 VITALS — BP 117/71 | HR 85 | Temp 97.5°F | Resp 15 | Ht 63.75 in | Wt 118.0 lb

## 2011-10-17 DIAGNOSIS — D126 Benign neoplasm of colon, unspecified: Secondary | ICD-10-CM

## 2011-10-17 DIAGNOSIS — Z1211 Encounter for screening for malignant neoplasm of colon: Secondary | ICD-10-CM

## 2011-10-17 MED ORDER — SODIUM CHLORIDE 0.9 % IV SOLN: 500.0000 mL | INTRAVENOUS | Status: DC

## 2011-10-17 NOTE — Progress Notes (Signed)
Patient did not experience any of the following events: a burn prior to discharge; a fall within the facility; wrong site/side/patient/procedure/implant event; or a hospital transfer or hospital admission upon discharge from the facility. (G8907) Patient did not have preoperative order for IV antibiotic SSI prophylaxis. (G8918)  

## 2011-10-17 NOTE — Op Note (Signed)
[facility] [address] [place], [place]  [address]  COLONOSCOPY PROCEDURE REPORT  PATIENT:  Mikayla, Washington  MR#:  [id number] BIRTHDATE:  1945-10-03, 65 yrs. old  GENDER:  female ENDOSCOPIST:  Vania Rea. Jarold Motto, MD, [facility] REF. BY: PROCEDURE DATE:  10/17/2011 PROCEDURE:  Colonoscopy with snare polypectomy ASA CLASS:  Class II INDICATIONS:  Routine Risk Screening MEDICATIONS:   propofol (Diprivan) 300 mg IV  DESCRIPTION OF PROCEDURE:   After the risks and benefits and of the procedure were explained, informed consent was obtained. Digital rectal exam was performed and revealed no abnormalities. The LB CF-[id number] endoscope was introduced through the anus and advanced to the cecum, which was identified by both the appendix and ileocecal valve.  The quality of the prep was excellent, using MoviPrep.  The instrument was then slowly withdrawn as the colon was fully examined. <<PROCEDUREIMAGES>>  FINDINGS:  A sessile polyp was found in the cecum. 6 mm flat cecal polyp hot snare removed  This was otherwise a normal examination of the colon.   R/O ADENOMA  The scope was then withdrawn from the patient and the procedure completed.  COMPLICATIONS:  None ENDOSCOPIC IMPRESSION: 1) Sessile polyp in the cecum 2) Otherwise normal examination R/O ADENOMA RECOMMENDATIONS: 1) Await biopsy results 2) Repeat colonoscopy in 5 years if polyp adenomatous; otherwise 10 years  REPEAT EXAM:  No  ______________________________ Vania Rea. Jarold Motto, MD, [facility]  CC:  Pecola Lawless, MD  n. Rosalie DoctorMarland Kitchen   Vania Rea. Verneal Wiers at 10/17/2011 11:48 AM  Luisa Hart, [phone]

## 2011-10-17 NOTE — Progress Notes (Signed)
The pt tolerated the colonoscopy very well. Maw   

## 2011-10-17 NOTE — Patient Instructions (Addendum)
YOU HAD AN ENDOSCOPIC PROCEDURE TODAY AT THE [facility]: Refer to the procedure report that was given to you for any specific questions about what was found during the examination.  If the procedure report does not answer your questions, please call your gastroenterologist to clarify.  If you requested that your care partner not be given the details of your procedure findings, then the procedure report has been included in a sealed envelope for you to review at your convenience later.  YOU SHOULD EXPECT: Some feelings of bloating in the abdomen. Passage of more gas than usual.  Walking can help get rid of the air that was put into your GI tract during the procedure and reduce the bloating. If you had a lower endoscopy (such as a colonoscopy or flexible sigmoidoscopy) you may notice spotting of blood in your stool or on the toilet paper. If you underwent a bowel prep for your procedure, then you may not have a normal bowel movement for a few days.  DIET: Your first meal following the procedure should be a light meal and then it is ok to progress to your normal diet.  A half-sandwich or bowl of soup is an example of a good first meal.  Heavy or fried foods are harder to digest and may make you feel nauseous or bloated.  Likewise meals heavy in dairy and vegetables can cause extra gas to form and this can also increase the bloating.  Drink plenty of fluids but you should avoid alcoholic beverages for 24 hours.  ACTIVITY: Your care partner should take you home directly after the procedure.  You should plan to take it easy, moving slowly for the rest of the day.  You can resume normal activity the day after the procedure however you should NOT DRIVE or use heavy machinery for 24 hours (because of the sedation medicines used during the test).    SYMPTOMS TO REPORT IMMEDIATELY: A gastroenterologist can be reached at any hour.  During normal business hours, 8:30 AM to 5:00 PM Monday through Friday,  call [phone].  After hours and on weekends, please call the GI answering service at [phone] who will take a message and have the physician on call contact you.   Following lower endoscopy (colonoscopy or flexible sigmoidoscopy):  Excessive amounts of blood in the stool  Significant tenderness or worsening of abdominal pains  Swelling of the abdomen that is new, acute  Fever of 100F or higher  FOLLOW UP: If any biopsies were taken you will be contacted by phone or by letter within the next 1-3 weeks.  Call your gastroenterologist if you have not heard about the biopsies in 3 weeks.  Our staff will call the home number listed on your records the next business day following your procedure to check on you and address any questions or concerns that you may have at that time regarding the information given to you following your procedure. This is a courtesy call and so if there is no answer at the home number and we have not heard from you through the emergency physician on call, we will assume that you have returned to your regular daily activities without incident.  SIGNATURES/CONFIDENTIALITY: You and/or your care partner have signed paperwork which will be entered into your electronic medical record.  These signatures attest to the fact that that the information above on your After Visit Summary has been reviewed and is understood.  Full responsibility of the confidentiality of this   discharge information lies with you and/or your care-partner.    Resume medications. Information given on polyps with discharge instructions. 

## 2011-10-18 ENCOUNTER — Telehealth: Payer: Self-pay

## 2011-10-18 NOTE — Telephone Encounter (Signed)
  Follow up Call-  Call back number 10/17/2011  Post procedure Call Back phone  # [phone]  Permission to leave phone message Yes     Patient questions:  Do you have a fever, pain , or abdominal swelling? no Pain Score  0 *  Have you tolerated food without any problems? yes  Have you been able to return to your normal activities? yes  Do you have any questions about your discharge instructions: Diet   no Medications  no Follow up visit  no  Do you have questions or concerns about your Care? no  Actions: * If pain score is 4 or above: No action needed, pain <4.  "This was the happiest bunch of people I HAVE BEEN AROUND IN A LONG TIME" per the pt.  "Everyone was wonderful" per the pt. Maw

## 2011-10-24 ENCOUNTER — Encounter: Payer: Self-pay | Admitting: Gastroenterology

## 2012-01-03 ENCOUNTER — Encounter: Payer: Self-pay | Admitting: Internal Medicine

## 2012-07-21 ENCOUNTER — Encounter: Payer: Self-pay | Admitting: Family Medicine

## 2012-07-21 ENCOUNTER — Ambulatory Visit (INDEPENDENT_AMBULATORY_CARE_PROVIDER_SITE_OTHER): Payer: BC Managed Care – PPO | Admitting: Family Medicine

## 2012-07-21 VITALS — BP 98/70 | Temp 98.0°F | Wt 116.0 lb

## 2012-07-21 DIAGNOSIS — J029 Acute pharyngitis, unspecified: Secondary | ICD-10-CM

## 2012-07-21 MED ORDER — AMOXICILLIN 500 MG PO CAPS: 500.0000 mg | ORAL_CAPSULE | Freq: Three times a day (TID) | ORAL | Status: DC

## 2012-07-21 NOTE — Assessment & Plan Note (Signed)
Started on amoxicillin 500 mg 1 tab 3 times a day. Encouraged probiotics increase rest, increase fluids and Mucinex. Report persistent symptoms

## 2012-07-21 NOTE — Progress Notes (Signed)
  Subjective:    Patient ID: Mikayla Washington, female    DOB: 23-Sep-1945, 67 y.o.   MRN: [id number]  HPI patient is a 67 year old Caucasian female who is in today with several days worth of worsening respiratory symptoms. She is complaining of some toe pain as well as hoarseness. Over the last 24 hours developed some left ear pain as well. Describes the ear is throbbing. Had some intermittent headaches as well as nasal and chest congestion. Notes the pain is bad enough to make it difficult to swallow. Struggles with malaise myalgias and insomnia as well. Denies fevers or chills. Denies myalgias, chest pain or palpitations. Has used some Alka-Seltzer cold and sinus for partial relief and NyQuil at night which is helping her sleep    Review of Systems  Constitutional: Negative for fever, chills and malaise/fatigue.  HENT: Positive for ear pain, congestion and sore throat.   Eyes: Negative for discharge.  Respiratory: Positive for cough and sputum production. Negative for shortness of breath.   Cardiovascular: Negative for chest pain, palpitations and leg swelling.  Gastrointestinal: Negative for nausea, abdominal pain and diarrhea.  Endocrine: Negative for polydipsia.  Genitourinary: Negative for dysuria.  Musculoskeletal: Negative for falls.  Skin: Negative for rash.  Neurological: Positive for headaches. Negative for loss of consciousness.  Psychiatric/Behavioral: Negative for depression and suicidal ideas. The patient is not nervous/anxious and does not have insomnia.    Review of Systems  Constitutional: Negative for fever, chills and malaise/fatigue.  HENT: Positive for ear pain, congestion and sore throat.   Eyes: Negative for discharge.  Respiratory: Positive for cough and sputum production. Negative for shortness of breath.   Cardiovascular: Negative for chest pain, palpitations and leg swelling.  Gastrointestinal: Negative for nausea, abdominal pain and diarrhea.  Genitourinary: Negative  for dysuria.  Musculoskeletal: Negative for falls.  Skin: Negative for rash.  Neurological: Positive for headaches. Negative for loss of consciousness.  Endo/Heme/Allergies: Negative for polydipsia.  Psychiatric/Behavioral: Negative for depression and suicidal ideas. The patient is not nervous/anxious and does not have insomnia.        Objective:   Physical Exam  Constitutional: She is oriented to person, place, and time. She appears well-developed and well-nourished. No distress.  HENT:  Head: Normocephalic and atraumatic.  Left Ear: External ear normal.  Oropharynx erythematous and swollen  Eyes: Conjunctivae are normal. Pupils are equal, round, and reactive to light. Right eye exhibits no discharge. Left eye exhibits no discharge.  Neck: Normal range of motion. Neck supple. No thyromegaly present.  Cardiovascular: Normal rate and regular rhythm.   Murmur heard. Pulmonary/Chest: She is in respiratory distress. She has no wheezes. She has rales.  Abdominal: She exhibits no distension. There is no tenderness.  Musculoskeletal: She exhibits no edema.  Lymphadenopathy:    She has cervical adenopathy.  Neurological: She is alert and oriented to person, place, and time. She has normal reflexes. No cranial nerve deficit.  Skin: Skin is warm and dry. She is not diaphoretic. No erythema. No pallor.  Psychiatric: She has a normal mood and affect. Judgment and thought content normal.          Assessment & Plan:  Acute pharyngitis Started on amoxicillin 500 mg 1 tab 3 times a day. Encouraged probiotics increase rest, increase fluids and Mucinex. Report persistent symptoms

## 2012-07-21 NOTE — Patient Instructions (Addendum)
Probiotic Digestive Advantage daily  Viral and Bacterial Pharyngitis Pharyngitis is soreness (inflammation) or infection of the pharynx. It is also called a sore throat. CAUSES  Most sore throats are caused by viruses and are part of a cold. However, some sore throats are caused by strep and other bacteria. Sore throats can also be caused by post nasal drip from draining sinuses, allergies and sometimes from sleeping with an open mouth. Infectious sore throats can be spread from person to person by coughing, sneezing and sharing cups or eating utensils. TREATMENT  Sore throats that are viral usually last 3-4 days. Viral illness will get better without medications (antibiotics). Strep throat and other bacterial infections will usually begin to get better about 24-48 hours after you begin to take antibiotics. HOME CARE INSTRUCTIONS   If the caregiver feels there is a bacterial infection or if there is a positive strep test, they will prescribe an antibiotic. The full course of antibiotics must be taken. If the full course of antibiotic is not taken, you or your child may become ill again. If you or your child has strep throat and do not finish all of the medication, serious heart or kidney diseases may develop.  Drink enough water and fluids to keep your urine clear or pale yellow.  Only take over-the-counter or prescription medicines for pain, discomfort or fever as directed by your caregiver.  Get lots of rest.  Gargle with salt water ( tsp. of salt in a glass of water) as often as every 1-2 hours as you need for comfort.  Hard candies may soothe the throat if individual is not at risk for choking. Throat sprays or lozenges may also be used. SEEK MEDICAL CARE IF:   Large, tender lumps in the neck develop.  A rash develops.  Green, yellow-brown or bloody sputum is coughed up.  Your baby is older than 3 months with a rectal temperature of 100.5 F (38.1 C) or higher for more than 1  day. SEEK IMMEDIATE MEDICAL CARE IF:   A stiff neck develops.  You or your child are drooling or unable to swallow liquids.  You or your child are vomiting, unable to keep medications or liquids down.  You or your child has severe pain, unrelieved with recommended medications.  You or your child are having difficulty breathing (not due to stuffy nose).  You or your child are unable to fully open your mouth.  You or your child develop redness, swelling, or severe pain anywhere on the neck.  You have a fever.  Your baby is older than 3 months with a rectal temperature of 102 F (38.9 C) or higher.  Your baby is 57 months old or younger with a rectal temperature of 100.4 F (38 C) or higher. MAKE SURE YOU:   Understand these instructions.  Will watch your condition.  Will get help right away if you are not doing well or get worse. Document Released: 04/04/2005 Document Revised: 06/27/2011 Document Reviewed: 07/02/2007 [facility] Patient Information 2013 [place], [place].

## 2012-09-08 ENCOUNTER — Encounter: Payer: Self-pay | Admitting: Family Medicine

## 2012-09-08 ENCOUNTER — Ambulatory Visit (INDEPENDENT_AMBULATORY_CARE_PROVIDER_SITE_OTHER): Payer: BC Managed Care – PPO | Admitting: Family Medicine

## 2012-09-08 VITALS — BP 120/70 | HR 104 | Temp 98.1°F | Wt 114.0 lb

## 2012-09-08 DIAGNOSIS — H9202 Otalgia, left ear: Secondary | ICD-10-CM

## 2012-09-08 DIAGNOSIS — J069 Acute upper respiratory infection, unspecified: Secondary | ICD-10-CM

## 2012-09-08 DIAGNOSIS — H9209 Otalgia, unspecified ear: Secondary | ICD-10-CM

## 2012-09-08 MED ORDER — AMOXICILLIN 875 MG PO TABS: 875.0000 mg | ORAL_TABLET | Freq: Two times a day (BID) | ORAL | Status: DC

## 2012-09-08 NOTE — Progress Notes (Signed)
   Date:  09/08/2012   Name:  Mikayla Washington   DOB:  1946/03/01   MRN:  [id number] Gender: female Age: 67 y.o.  Primary Physician:  Marga Melnick, MD  Evaluating MD: Hannah Beat, MD  Chief Complaint: Cough and Sore Throat   History of Present Illness:  Mikayla Washington is a 67 y.o. very pleasant female patient who presents with the following:  Took some nyquil last night. Had a lot of congestion, and her left ear hurts. Did have an upset stomach and a lot of coughing. A lot of congestion.  Generally does not feel well and has had progressively worse pain in the left ear. O/w she is a healthy woman with no cardiopulmonary disease.   Past Medical History, Surgical History, Social History, Family History, Problem List, Medications, and Allergies have been reviewed and updated if relevant.  Current Outpatient Prescriptions on File Prior to Visit  Medication Sig Dispense Refill  . Cholecalciferol (VITAMIN D-3 PO) Take 1 tablet by mouth daily.      . Multiple Vitamins-Minerals (CENTRUM SILVER PO) Take 1 tablet by mouth daily.       No current facility-administered medications on file prior to visit.    Review of Systems: ROS: GEN: Acute illness details above GI: Tolerating PO intake GU: maintaining adequate hydration and urination Pulm: No SOB Interactive and getting along well at home.  Otherwise, ROS is as per the HPI.   Physical Examination: BP 120/70  Pulse 104  Temp(Src) 98.1 F (36.7 C) (Oral)  Wt 114 lb (51.71 kg)  BMI 19.73 kg/m2   Gen: WDWN, NAD; A & O x3, cooperative. Pleasant.Globally Non-toxic HEENT: Normocephalic and atraumatic. Throat clear, w/o exudate, R TM clear, L TM - not visualized and cerumen deep, dark impaction. rhinnorhea.  MMM Frontal sinuses: NT Max sinuses: NT NECK: Anterior cervical  LAD is absent CV: RRR, No M/G/R, cap refill <2 sec PULM: Breathing comfortably in no respiratory distress. no wheezing, crackles, rhonchi EXT: No c/c/e PSYCH:  Friendly, good eye contact MSK: Nml gait    Assessment and Plan:  Ear pain, left  URI (upper respiratory infection)  Uri, cannot exclude L OM, rx with amox. Supportive care  Orders Today:  No orders of the defined types were placed in this encounter.    Updated Medication List: (Includes new medications, updates to list, dose adjustments) Meds ordered this encounter  Medications  . amoxicillin (AMOXIL) 875 MG tablet    Sig: Take 1 tablet (875 mg total) by mouth 2 (two) times daily.    Dispense:  20 tablet    Refill:  0    Medications Discontinued: Medications Discontinued During This Encounter  Medication Reason  . amoxicillin (AMOXIL) 500 MG capsule Completed Course      Signed, Karleen Hampshire T. Keniel Ralston, MD 09/08/2012 11:42 AM

## 2012-11-02 ENCOUNTER — Ambulatory Visit (INDEPENDENT_AMBULATORY_CARE_PROVIDER_SITE_OTHER): Payer: BC Managed Care – PPO | Admitting: Internal Medicine

## 2012-11-02 ENCOUNTER — Encounter: Payer: Self-pay | Admitting: Internal Medicine

## 2012-11-02 VITALS — BP 136/80 | HR 75 | Temp 97.7°F | Resp 14 | Ht 64.5 in | Wt 118.0 lb

## 2012-11-02 DIAGNOSIS — K589 Irritable bowel syndrome without diarrhea: Secondary | ICD-10-CM

## 2012-11-02 DIAGNOSIS — Z860101 Personal history of adenomatous and serrated colon polyps: Secondary | ICD-10-CM | POA: Insufficient documentation

## 2012-11-02 DIAGNOSIS — M949 Disorder of cartilage, unspecified: Secondary | ICD-10-CM

## 2012-11-02 DIAGNOSIS — Z0000 Encounter for general adult medical examination without abnormal findings: Secondary | ICD-10-CM

## 2012-11-02 DIAGNOSIS — H9313 Tinnitus, bilateral: Secondary | ICD-10-CM

## 2012-11-02 DIAGNOSIS — M8580 Other specified disorders of bone density and structure, unspecified site: Secondary | ICD-10-CM | POA: Insufficient documentation

## 2012-11-02 DIAGNOSIS — H9319 Tinnitus, unspecified ear: Secondary | ICD-10-CM | POA: Insufficient documentation

## 2012-11-02 DIAGNOSIS — E559 Vitamin D deficiency, unspecified: Secondary | ICD-10-CM

## 2012-11-02 DIAGNOSIS — Z86010 Personal history of colonic polyps: Secondary | ICD-10-CM

## 2012-11-02 DIAGNOSIS — R7989 Other specified abnormal findings of blood chemistry: Secondary | ICD-10-CM

## 2012-11-02 LAB — HEPATIC FUNCTION PANEL
ALT: 15 U/L (ref 0–35)
AST: 16 U/L (ref 0–37)
Albumin: 4.3 g/dL (ref 3.5–5.2)
Alkaline Phosphatase: 78 U/L (ref 39–117)
Total Bilirubin: 0.5 mg/dL (ref 0.3–1.2)

## 2012-11-02 LAB — CBC WITH DIFFERENTIAL/PLATELET
Eosinophils Relative: 3 % (ref 0.0–5.0)
HCT: 41.5 % (ref 36.0–46.0)
Hemoglobin: 13.6 g/dL (ref 12.0–15.0)
Lymphs Abs: 1.7 10*3/uL (ref 0.7–4.0)
Monocytes Relative: 7.1 % (ref 3.0–12.0)
Neutro Abs: 3.1 10*3/uL (ref 1.4–7.7)
RBC: 4.55 Mil/uL (ref 3.87–5.11)
WBC: 5.5 10*3/uL (ref 4.5–10.5)

## 2012-11-02 LAB — BASIC METABOLIC PANEL
GFR: 78.33 mL/min (ref >60.00)
Potassium: 4 mEq/L (ref 3.5–5.1)
Sodium: 140 mEq/L (ref 135–145)

## 2012-11-02 LAB — LIPID PANEL
Cholesterol: 203 mg/dL — ABNORMAL HIGH (ref 0–200)
Total CHOL/HDL Ratio: 3

## 2012-11-02 NOTE — Patient Instructions (Addendum)
Please take the probiotic , Align, every day until the bowels are normal. This will replace the normal bacteria which  are necessary for formation of normal stool and processing of food. Miralax every 3 rd day as needed for constipation.  If you activate the  My Chart system; lab & Xray results will be released directly  to you as soon as I review & address these through the computer. If you choose not to sign up for My Chart within 36 hours of labs being drawn; results will be reviewed & interpretation added before being copied & mailed, causing a delay in getting the results to you.If you do not receive that report within 7-10 days ,please call. Additionally you can use this system to gain direct  access to your records  if  out of town or @ an office of a  physician who is not in  the My Chart network.  This improves continuity of care & places you in control of your medical record.

## 2012-11-02 NOTE — Progress Notes (Signed)
  Subjective:    Patient ID: Mikayla Washington, female    DOB: 1945/10/27, 67 y.o.   MRN: [id number]  HPI  Mikayla Washington is here for a physical;acute issues rectal discomfort.     Review of Systems  She's had some rectal discomfort in the context of chronic constipation which is been slightly worse recently. She does not take any medications for this but simply ingests increased amounts of fluids such as tea.  She does have intermittent loose stools as well. She has no unexplained weight loss, abdominal pain, diarrhea, melena, or rectal bleeding.  Her colonoscopy is up-to-date; she had a tubular adenoma removed 10/17/11.     Objective:   Physical Exam  Gen.: Thin but healthy and well-nourished in appearance. Alert, appropriate and cooperative throughout exam. Head: Normocephalic without obvious abnormalities  Eyes: No corneal or conjunctival inflammation noted.  Extraocular motion intact. Vision grossly decreased OS without lenses Ears: External  ear exam reveals no significant lesions or deformities. Some wax on L. Visualized TMs normal. Hearing is grossly normal bilaterally. Nose: External nasal exam reveals no deformity or inflammation. Nasal mucosa are pink and moist. No lesions or exudates noted.   Mouth: Oral mucosa and oropharynx reveal no lesions or exudates. Teeth in good repair. Neck: No deformities, masses, or tenderness noted. Range of motion & Thyroid normal. Lungs: Normal respiratory effort; chest expands symmetrically. Lungs are clear to auscultation without rales, wheezes, or increased work of breathing. Heart: Normal rate and rhythm. Normal S1 and S2. No gallop, click, or rub. S4 w/o murmur. Abdomen: Bowel sounds normal; abdomen soft and nontender. No masses, organomegaly or hernias noted. Genitalia: As per Gyn                                  Musculoskeletal/extremities: Slightly accentuated curvature of upper thoracic  Spine. There is some asymmetry of the posterior thoracic  musculature suggesting occult scoliosis. No clubbing, cyanosis, edema, or significant extremity  deformity noted. Range of motion normal .Tone & strength  Normal. Joints normal. Nail health good. Able to lie down & sit up w/o help. Negative SLR bilaterally Vascular: Carotid, radial artery, dorsalis pedis and  posterior tibial pulses are full and equal. No bruits present. Neurologic: Alert and oriented x3. Deep tendon reflexes symmetrical and normal.         Skin: Intact without suspicious lesions or rashes. Lymph: No cervical, axillary lymphadenopathy present. Psych: Mood and affect are normal. Normally interactive                                                                                       Assessment & Plan:  #1 comprehensive physical exam; no acute findings #2 IBS  Plan: see Orders  & Recommendations

## 2012-11-07 LAB — VITAMIN D 1,25 DIHYDROXY
Vitamin D 1, 25 (OH)2 Total: 48 pg/mL (ref 18–72)
Vitamin D2 1, 25 (OH)2: <8 pg/mL
Vitamin D3 1, 25 (OH)2: 48 pg/mL

## 2013-01-24 ENCOUNTER — Encounter: Payer: Self-pay | Admitting: Internal Medicine

## 2013-05-04 ENCOUNTER — Encounter: Payer: Self-pay | Admitting: Endocrinology

## 2013-05-04 ENCOUNTER — Ambulatory Visit (INDEPENDENT_AMBULATORY_CARE_PROVIDER_SITE_OTHER): Payer: BC Managed Care – PPO | Admitting: Endocrinology

## 2013-05-04 VITALS — BP 124/80 | HR 113 | Temp 99.7°F | Wt 123.0 lb

## 2013-05-04 DIAGNOSIS — J069 Acute upper respiratory infection, unspecified: Secondary | ICD-10-CM

## 2013-05-04 MED ORDER — CEFUROXIME AXETIL 250 MG PO TABS: 250.0000 mg | ORAL_TABLET | Freq: Two times a day (BID) | ORAL | Status: AC

## 2013-05-04 MED ORDER — PROMETHAZINE-CODEINE 6.25-10 MG/5ML PO SYRP: 5.0000 mL | ORAL_SOLUTION | ORAL | Status: DC | PRN

## 2013-05-04 NOTE — Progress Notes (Signed)
   Subjective:    Patient ID: Mikayla Washington, female    DOB: 1945/12/09, 68 y.o.   MRN: [id number]  HPI Pt states 2 days of moderate prod-quality cough in the chest, and assoc sore throat. Past Medical History  Diagnosis Date  . DJD (degenerative joint disease)   . Breast disease     fibrocystic  . Adenomatous colon polyp     Past Surgical History  Procedure Laterality Date  . Tonsillectomy    . Breast lumpectomy      X 3  . Total abdominal hysterectomy w/ bilateral salpingoophorectomy  2001     benign ovarian tumor  . Colonoscopy  2002     Dr Sharlett Iles, negative  . Colonoscopy with polypectomy  2013    adenomatous polyp    History   Social History  . Marital Status: Married    Spouse Name: N/A    Number of Children: N/A  . Years of Education: N/A   Occupational History  . Not on file.   Social History Main Topics  . Smoking status: Never Smoker   . Smokeless tobacco: Never Used  . Alcohol Use: Yes     Comment: rare, 1 x per year or at celebrations   . Drug Use: No  . Sexual Activity: Not on file   Other Topics Concern  . Not on file   Social History Narrative  . No narrative on file    Current Outpatient Prescriptions on File Prior to Visit  Medication Sig Dispense Refill  . Cholecalciferol (VITAMIN D-3 PO) Take 1 tablet by mouth daily.      . Multiple Vitamins-Minerals (CENTRUM SILVER PO) Take 1 tablet by mouth daily.       No current facility-administered medications on file prior to visit.   No Known Allergies  Family History  Problem Relation Age of Onset  . Adopted: Yes   BP 124/80  Pulse 113  Temp(Src) 99.7 F (37.6 C) (Oral)  Wt 123 lb (55.792 kg)  SpO2 98%  Review of Systems She has left otalgia, but no fever.  She has nasal congestion.      Objective:   Physical Exam VITAL SIGNS:  See vs page GENERAL: no distress head: no deformity eyes: no periorbital swelling, no proptosis external nose and ears are normal mouth: no lesion  seen Both tm's are slightly red LUNGS:  Clear to auscultation.       Assessment & Plan:  URI, new.

## 2013-05-04 NOTE — Patient Instructions (Addendum)
i have sent a prescription to your pharmacy, for an antibiotic pill. Here is a prescription for cough syrup. Loratadine-d (non-prescription) will help your congestion. I hope you feel better soon.  If you don't feel better by next week, please call back.  Please call sooner if you get worse.

## 2013-11-26 ENCOUNTER — Ambulatory Visit (INDEPENDENT_AMBULATORY_CARE_PROVIDER_SITE_OTHER): Payer: BC Managed Care – PPO | Admitting: Internal Medicine

## 2013-11-26 ENCOUNTER — Other Ambulatory Visit (INDEPENDENT_AMBULATORY_CARE_PROVIDER_SITE_OTHER): Payer: BC Managed Care – PPO

## 2013-11-26 ENCOUNTER — Encounter: Payer: Self-pay | Admitting: Internal Medicine

## 2013-11-26 VITALS — BP 98/64 | HR 77 | Temp 97.9°F | Wt 126.8 lb

## 2013-11-26 DIAGNOSIS — Z0000 Encounter for general adult medical examination without abnormal findings: Secondary | ICD-10-CM

## 2013-11-26 DIAGNOSIS — M949 Disorder of cartilage, unspecified: Secondary | ICD-10-CM

## 2013-11-26 DIAGNOSIS — E559 Vitamin D deficiency, unspecified: Secondary | ICD-10-CM

## 2013-11-26 DIAGNOSIS — M8580 Other specified disorders of bone density and structure, unspecified site: Secondary | ICD-10-CM

## 2013-11-26 DIAGNOSIS — Z86010 Personal history of colonic polyps: Secondary | ICD-10-CM

## 2013-11-26 DIAGNOSIS — M899 Disorder of bone, unspecified: Secondary | ICD-10-CM

## 2013-11-26 DIAGNOSIS — E785 Hyperlipidemia, unspecified: Secondary | ICD-10-CM | POA: Insufficient documentation

## 2013-11-26 LAB — CBC WITH DIFFERENTIAL/PLATELET
BASOS PCT: 0.2 % (ref 0.0–3.0)
Basophils Absolute: 0 10*3/uL (ref 0.0–0.1)
EOS PCT: 2.8 % (ref 0.0–5.0)
Eosinophils Absolute: 0.2 10*3/uL (ref 0.0–0.7)
HCT: 40.5 % (ref 36.0–46.0)
Hemoglobin: 13.5 g/dL (ref 12.0–15.0)
LYMPHS ABS: 2.3 10*3/uL (ref 0.7–4.0)
Lymphocytes Relative: 28.1 % (ref 12.0–46.0)
MCHC: 33.3 g/dL (ref 30.0–36.0)
MCV: 90 fl (ref 78.0–100.0)
Monocytes Absolute: 0.5 10*3/uL (ref 0.1–1.0)
Monocytes Relative: 6.6 % (ref 3.0–12.0)
Neutro Abs: 5.2 10*3/uL (ref 1.4–7.7)
Neutrophils Relative %: 62.3 % (ref 43.0–77.0)
Platelets: 239 10*3/uL (ref 150.0–400.0)
RBC: 4.49 Mil/uL (ref 3.87–5.11)
RDW: 14.5 % (ref 11.5–15.5)
WBC: 8.3 10*3/uL (ref 4.0–10.5)

## 2013-11-26 LAB — LIPID PANEL
CHOL/HDL RATIO: 3
CHOLESTEROL: 186 mg/dL (ref 0–200)
HDL: 62.7 mg/dL (ref >39.00)
LDL Cholesterol: 110 mg/dL — ABNORMAL HIGH (ref 0–99)
NonHDL: 123.3
Triglycerides: 69 mg/dL (ref 0.0–149.0)
VLDL: 13.8 mg/dL (ref 0.0–40.0)

## 2013-11-26 LAB — TSH: TSH: 1.08 u[IU]/mL (ref 0.35–4.50)

## 2013-11-26 LAB — BASIC METABOLIC PANEL
BUN: 19 mg/dL (ref 6–23)
CHLORIDE: 103 meq/L (ref 96–112)
CO2: 26 mEq/L (ref 19–32)
Calcium: 9.3 mg/dL (ref 8.4–10.5)
Creatinine, Ser: 0.7 mg/dL (ref 0.4–1.2)
GFR: 85.63 mL/min (ref >60.00)
Glucose, Bld: 90 mg/dL (ref 70–99)
Potassium: 4.6 mEq/L (ref 3.5–5.1)
SODIUM: 139 meq/L (ref 135–145)

## 2013-11-26 LAB — HEPATIC FUNCTION PANEL
ALT: 15 U/L (ref 0–35)
AST: 15 U/L (ref 0–37)
Albumin: 4.1 g/dL (ref 3.5–5.2)
Alkaline Phosphatase: 74 U/L (ref 39–117)
BILIRUBIN DIRECT: 0.1 mg/dL (ref 0.0–0.3)
TOTAL PROTEIN: 6.7 g/dL (ref 6.0–8.3)
Total Bilirubin: 0.6 mg/dL (ref 0.2–1.2)

## 2013-11-26 LAB — VITAMIN D 25 HYDROXY (VIT D DEFICIENCY, FRACTURES): VITD: 35.19 ng/mL (ref 30.00–100.00)

## 2013-11-26 NOTE — Patient Instructions (Signed)
Your next office appointment will be determined based upon review of your pending labs &/ BMD. Those instructions will be transmitted to you through My Chart . The bone density referral will be scheduled and you'll be notified of the time.

## 2013-11-26 NOTE — Assessment & Plan Note (Deleted)
CBC

## 2013-11-26 NOTE — Progress Notes (Signed)
Pre visit review using our clinic review tool, if applicable. No additional management support is needed unless otherwise documented below in the visit note. 

## 2013-11-26 NOTE — Assessment & Plan Note (Deleted)
Vitamin D level 

## 2013-11-26 NOTE — Assessment & Plan Note (Addendum)
BMD 

## 2013-11-26 NOTE — Progress Notes (Signed)
   Subjective:    Patient ID: Mikayla Washington, female    DOB: 1945/07/28, 68 y.o.   MRN: [id number]  HPI  She  is here for a physical;acute issues denied.    Review of Systems Unexplained weight loss, abdominal pain, significant dyspepsia, dysphagia, melena, rectal bleeding, or persistently small caliber stools are denied.   Chest pain, palpitations, tachycardia, exertional dyspnea, paroxysmal nocturnal dyspnea, claudication or edema are absent. Exercising most days.       Objective:   Physical Exam  Gen.: Thin but healthy and well-nourished in appearance. Alert, appropriate and cooperative throughout exam. Appears younger than stated age  Head: Normocephalic without obvious abnormalities  Eyes: No corneal or conjunctival inflammation noted. Pupils equal round reactive to light and accommodation. Extraocular motion intact.  Ears: External  ear exam reveals no significant lesions or deformities. Canals clear .TMs normal. Hearing is grossly normal bilaterally. Nose: External nasal exam reveals no deformity or inflammation. Nasal mucosa are pink and moist. No lesions or exudates noted.   Mouth: Oral mucosa and oropharynx reveal no lesions or exudates. Teeth in good repair. Neck: No deformities, masses, or tenderness noted. Range of motion & Thyroid normal. Lungs: Normal respiratory effort; chest expands symmetrically. Lungs are clear to auscultation without rales, wheezes, or increased work of breathing. Heart: Normal rate and rhythm. Normal S1 and S2. No gallop, click, or rub. No murmur. Abdomen: Bowel sounds normal; abdomen soft and nontender. No masses, organomegaly or hernias noted.Aorta palpable; faint bruit ; no AAA Genitalia:  as per Gyn                                  Musculoskeletal/extremities: No deformity or scoliosis noted of  the thoracic or lumbar spine.  No clubbing, cyanosis, edema, or significant extremity  deformity noted. Range of motion normal .Tone & strength  normal. Hand joints normal Fingernail health good. Able to lie down & sit up w/o help. Negative SLR bilaterally Vascular: Carotid, radial artery, dorsalis pedis and  posterior tibial pulses are full and equal. No bruits present. Neurologic: Alert and oriented x3. Deep tendon reflexes symmetrical and normal.  Gait normal.      Skin: Intact without suspicious lesions or rashes. Lymph: No cervical, axillary lymphadenopathy present. Psych: Mood and affect are normal. Normally interactive                                                                                       Assessment & Plan:  #1 comprehensive physical exam; no acute findings  Plan: see Orders  & Recommendations

## 2013-12-24 ENCOUNTER — Encounter: Payer: Self-pay | Admitting: Internal Medicine

## 2014-01-11 ENCOUNTER — Encounter: Payer: Self-pay | Admitting: Family Medicine

## 2014-01-11 ENCOUNTER — Ambulatory Visit (INDEPENDENT_AMBULATORY_CARE_PROVIDER_SITE_OTHER): Payer: BC Managed Care – PPO | Admitting: Family Medicine

## 2014-01-11 VITALS — BP 112/66 | Temp 98.4°F | Wt 120.0 lb

## 2014-01-11 DIAGNOSIS — K5909 Other constipation: Secondary | ICD-10-CM | POA: Insufficient documentation

## 2014-01-11 DIAGNOSIS — K5900 Constipation, unspecified: Secondary | ICD-10-CM

## 2014-01-11 MED ORDER — LUBIPROSTONE 8 MCG PO CAPS: 8.0000 ug | ORAL_CAPSULE | Freq: Two times a day (BID) | ORAL | Status: DC

## 2014-01-11 MED ORDER — POLYETHYLENE GLYCOL 3350 17 GM/SCOOP PO POWD: 17.0000 g | Freq: Two times a day (BID) | ORAL | Status: DC | PRN

## 2014-01-11 MED ORDER — DOCUSATE SODIUM 100 MG PO CAPS: 100.0000 mg | ORAL_CAPSULE | Freq: Every day | ORAL | Status: DC

## 2014-01-11 NOTE — Assessment & Plan Note (Signed)
Doubt obstruction or impaction. Treat with miralax bid, colace, increased water. Discussed possible starting amitiza daily once feeling better - prescription provided today. Discussed importance of returning if not improving. Discussed red flags to seek ER care - worsening abd pain, nausea/vomiting, fever.

## 2014-01-11 NOTE — Patient Instructions (Addendum)
Stop dulcolax. Start colace 100mg  twice daily. Start miralax 17 gm in 8 oz of water twice daily for next 1 week. After this may take once daily to help prevent persistent constipation and keep stools soft. If persistent constipation despite this, let us know. Increase water intake - especially important for miralax to help. If worsening pain, fever, nausea/vomiting, please seek urgent care.  Constipation Constipation is when a person has fewer than three bowel movements a week, has difficulty having a bowel movement, or has stools that are dry, hard, or larger than normal. As people grow older, constipation is more common. If you try to fix constipation with medicines that make you have a bowel movement (laxatives), the problem may get worse. Long-term laxative use may cause the muscles of the colon to become weak. A low-fiber diet, not taking in enough fluids, and taking certain medicines may make constipation worse.  CAUSES   Certain medicines, such as antidepressants, pain medicine, iron supplements, antacids, and water pills.   Certain diseases, such as diabetes, irritable bowel syndrome (IBS), thyroid disease, or depression.   Not drinking enough water.   Not eating enough fiber-rich foods.   Stress or travel.   Lack of physical activity or exercise.   Ignoring the urge to have a bowel movement.   Using laxatives too much.  SIGNS AND SYMPTOMS   Having fewer than three bowel movements a week.   Straining to have a bowel movement.   Having stools that are hard, dry, or larger than normal.   Feeling full or bloated.   Pain in the lower abdomen.   Not feeling relief after having a bowel movement.  DIAGNOSIS  Your health care provider will take a medical history and perform a physical exam. Further testing may be done for severe constipation. Some tests may include:  A barium enema X-ray to examine your rectum, colon, and, sometimes, your small intestine.   A  sigmoidoscopy to examine your lower colon.   A colonoscopy to examine your entire colon. TREATMENT  Treatment will depend on the severity of your constipation and what is causing it. Some dietary treatments include drinking more fluids and eating more fiber-rich foods. Lifestyle treatments may include regular exercise. If these diet and lifestyle recommendations do not help, your health care provider may recommend taking over-the-counter laxative medicines to help you have bowel movements. Prescription medicines may be prescribed if over-the-counter medicines do not work.  HOME CARE INSTRUCTIONS   Eat foods that have a lot of fiber, such as fruits, vegetables, whole grains, and beans.  Limit foods high in fat and processed sugars, such as french fries, hamburgers, cookies, candies, and soda.   A fiber supplement may be added to your diet if you cannot get enough fiber from foods.   Drink enough fluids to keep your urine clear or pale yellow.   Exercise regularly or as directed by your health care provider.   Go to the restroom when you have the urge to go. Do not hold it.   Only take over-the-counter or prescription medicines as directed by your health care provider. Do not take other medicines for constipation without talking to your health care provider first.  Carbon IF:   You have bright red blood in your stool.   Your constipation lasts for more than 4 days or gets worse.   You have abdominal or rectal pain.   You have thin, pencil-like stools.   You have unexplained weight loss.  MAKE SURE YOU:   Understand these instructions.  Will watch your condition.  Will get help right away if you are not doing well or get worse. Document Released: 01/01/2004 Document Revised: 04/09/2013 Document Reviewed: 01/14/2013 [facility] Patient Information 2015 [place], [place]. This information is not intended to replace advice given to you by your health care  provider. Make sure you discuss any questions you have with your health care provider.

## 2014-01-11 NOTE — Progress Notes (Signed)
BP 112/66  Temp(Src) 98.4 F (36.9 C)  Wt 120 lb (54.432 kg)   CC: abd discomfort  Subjective:    Patient ID: Mikayla Washington, female    DOB: [birth year], 68 y.o.   MRN: [id number]  HPI: Mikayla Washington is a 68 y.o. female presenting on 01/11/2014 for Abdominal Pain   1 mo h/o worsening constipation. "I feel blocked up" some abd/rectal pain this morning different from prior. Last stool was last night, small amt with straining (pellets). Passing gas fine.  Longstanding h/o chronic constipation. Pain is new. Decreased appetite. Good fiber in diet - fruits and vegetables, shredded wheat.   Has been self treating with dulcolax pill daily, drinking plenty of water/hot tea.   Colonoscopy with polypectomy Date: 2013 adenomatous polyp Sharlett Iles).   No nausea/vomiting, fevers/chills. No diarrhea. S/p hysterectomy, no other abd surgeries.  Wt Readings from Last 3 Encounters:  01/11/14 120 lb (54.432 kg)  11/26/13 126 lb 12 oz (57.493 kg)  05/04/13 123 lb (55.792 kg)   Body mass index is 20.29 kg/(m^2).   Past Medical History  Diagnosis Date  . DJD (degenerative joint disease)   . Breast disease     fibrocystic  . Adenomatous colon polyp 2013    Dr Sharlett Iles    Past Surgical History  Procedure Laterality Date  . Tonsillectomy    . Breast lumpectomy      X 3  . Total abdominal hysterectomy w/ bilateral salpingoophorectomy  2001     benign ovarian tumor  . Colonoscopy  2002     Dr Sharlett Iles, negative  . Colonoscopy with polypectomy  2013    adenomatous polyp   Relevant past medical, surgical, family and social history reviewed and updated as indicated.  Allergies and medications reviewed and updated. Current Outpatient Prescriptions on File Prior to Visit  Medication Sig  . aspirin 81 MG tablet Take 81 mg by mouth daily.  . Cholecalciferol (VITAMIN D-3 PO) Take 1 tablet by mouth daily.  . Multiple Vitamins-Minerals (CENTRUM SILVER PO) Take 1 tablet by mouth daily.   No current  facility-administered medications on file prior to visit.    Review of Systems Per HPI unless specifically indicated above    Objective:    BP 112/66  Temp(Src) 98.4 F (36.9 C)  Wt 120 lb (54.432 kg)  Physical Exam  Nursing note and vitals reviewed. Constitutional: She appears well-developed and well-nourished. No distress.  HENT:  Mouth/Throat: Oropharynx is clear and moist. No oropharyngeal exudate.  Abdominal: Soft. Normal appearance. She exhibits no distension and no mass. Bowel sounds are increased. There is no hepatosplenomegaly. There is tenderness (mild) in the left lower quadrant. There is no rigidity, no rebound, no guarding, no CVA tenderness and negative Murphy's sign.  Genitourinary: Rectal exam shows external hemorrhoid. Rectal exam shows no internal hemorrhoid, no fissure, no mass, no tenderness and anal tone normal.  No stool in vault, no impaction in rectum        Assessment & Plan:   Problem List Items Addressed This Visit   Chronic constipation - Primary     Doubt obstruction or impaction. Treat with miralax bid, colace, increased water. Discussed possible starting amitiza daily once feeling better - prescription provided today. Discussed importance of returning if not improving. Discussed red flags to seek ER care - worsening abd pain, nausea/vomiting, fever.    Relevant Medications      docusate sodium (COLACE) capsule      POLYETHYLENE GLYCOL 3350 17 GM  PO POWD       Follow up plan: Return if symptoms worsen or fail to improve.

## 2014-01-13 ENCOUNTER — Encounter: Payer: Self-pay | Admitting: Internal Medicine

## 2014-01-13 ENCOUNTER — Other Ambulatory Visit (INDEPENDENT_AMBULATORY_CARE_PROVIDER_SITE_OTHER): Payer: BC Managed Care – PPO

## 2014-01-13 ENCOUNTER — Ambulatory Visit (INDEPENDENT_AMBULATORY_CARE_PROVIDER_SITE_OTHER): Payer: BC Managed Care – PPO | Admitting: Internal Medicine

## 2014-01-13 ENCOUNTER — Ambulatory Visit (INDEPENDENT_AMBULATORY_CARE_PROVIDER_SITE_OTHER)
Admission: RE | Admit: 2014-01-13 | Discharge: 2014-01-13 | Disposition: A | Discharge status: Home / Self Care | Payer: BC Managed Care – PPO | Source: Ambulatory Visit | Attending: Internal Medicine | Admitting: Internal Medicine

## 2014-01-13 VITALS — BP 118/82 | HR 95 | Temp 97.8°F | Resp 16 | Ht 64.5 in | Wt 120.0 lb

## 2014-01-13 DIAGNOSIS — R109 Unspecified abdominal pain: Secondary | ICD-10-CM | POA: Insufficient documentation

## 2014-01-13 DIAGNOSIS — K5900 Constipation, unspecified: Secondary | ICD-10-CM

## 2014-01-13 DIAGNOSIS — K5909 Other constipation: Secondary | ICD-10-CM

## 2014-01-13 LAB — COMPREHENSIVE METABOLIC PANEL
ALT: 18 U/L (ref 0–35)
AST: 21 U/L (ref 0–37)
Albumin: 4.4 g/dL (ref 3.5–5.2)
Alkaline Phosphatase: 75 U/L (ref 39–117)
BILIRUBIN TOTAL: 0.5 mg/dL (ref 0.2–1.2)
BUN: 18 mg/dL (ref 6–23)
CALCIUM: 9.4 mg/dL (ref 8.4–10.5)
CO2: 28 meq/L (ref 19–32)
CREATININE: 0.8 mg/dL (ref 0.4–1.2)
Chloride: 105 mEq/L (ref 96–112)
GFR: 72.64 mL/min (ref >60.00)
GLUCOSE: 91 mg/dL (ref 70–99)
Potassium: 3.9 mEq/L (ref 3.5–5.1)
Sodium: 139 mEq/L (ref 135–145)
Total Protein: 6.9 g/dL (ref 6.0–8.3)

## 2014-01-13 LAB — CBC WITH DIFFERENTIAL/PLATELET
BASOS PCT: 0.6 % (ref 0.0–3.0)
Basophils Absolute: 0 10*3/uL (ref 0.0–0.1)
EOS PCT: 2 % (ref 0.0–5.0)
Eosinophils Absolute: 0.1 10*3/uL (ref 0.0–0.7)
HEMATOCRIT: 40.5 % (ref 36.0–46.0)
HEMOGLOBIN: 13.3 g/dL (ref 12.0–15.0)
LYMPHS ABS: 2.2 10*3/uL (ref 0.7–4.0)
Lymphocytes Relative: 34.1 % (ref 12.0–46.0)
MCHC: 32.9 g/dL (ref 30.0–36.0)
MCV: 91 fl (ref 78.0–100.0)
Monocytes Absolute: 0.4 10*3/uL (ref 0.1–1.0)
Monocytes Relative: 6.2 % (ref 3.0–12.0)
NEUTROS ABS: 3.7 10*3/uL (ref 1.4–7.7)
Neutrophils Relative %: 57.1 % (ref 43.0–77.0)
Platelets: 244 10*3/uL (ref 150.0–400.0)
RBC: 4.44 Mil/uL (ref 3.87–5.11)
RDW: 14.2 % (ref 11.5–15.5)
WBC: 6.4 10*3/uL (ref 4.0–10.5)

## 2014-01-13 LAB — URINALYSIS, ROUTINE W REFLEX MICROSCOPIC
BILIRUBIN URINE: NEGATIVE
Hgb urine dipstick: NEGATIVE
Ketones, ur: NEGATIVE
LEUKOCYTES UA: NEGATIVE
Nitrite: NEGATIVE
RBC / HPF: NONE SEEN (ref 0–?)
Total Protein, Urine: NEGATIVE
Urine Glucose: NEGATIVE
Urobilinogen, UA: 0.2 (ref 0.0–1.0)
WBC, UA: NONE SEEN (ref 0–?)
pH: 5.5 (ref 5.0–8.0)

## 2014-01-13 LAB — C-REACTIVE PROTEIN

## 2014-01-13 LAB — LIPASE: Lipase: 46 U/L (ref 11.0–59.0)

## 2014-01-13 LAB — SEDIMENTATION RATE: Sed Rate: 6 mm/hr (ref 0–22)

## 2014-01-13 LAB — AMYLASE: Amylase: 57 U/L (ref 27–131)

## 2014-01-13 NOTE — Patient Instructions (Signed)

## 2014-01-13 NOTE — Progress Notes (Signed)
Subjective:    Patient ID: Mikayla Washington, female    DOB: 03-11-1946, 68 y.o.   MRN: [id number]  Abdominal Pain This is a recurrent problem. The current episode started 1 to 4 weeks ago. The onset quality is gradual. The problem occurs intermittently. The problem has been unchanged. The pain is located in the generalized abdominal region. The pain is at a severity of 1/10. The pain is mild. The quality of the pain is aching. The abdominal pain does not radiate. Associated symptoms include constipation. Pertinent negatives include no anorexia, arthralgias, belching, diarrhea, dysuria, fever, flatus, frequency, headaches, hematochezia, hematuria, melena, myalgias, nausea, vomiting or weight loss. Nothing aggravates the pain. The pain is relieved by nothing. She has tried nothing for the symptoms. The treatment provided no relief. Prior diagnostic workup includes lower endoscopy. There is no history of abdominal surgery, colon cancer, Crohn's disease, gallstones, GERD, irritable bowel syndrome, pancreatitis, PUD or ulcerative colitis.      Review of Systems  Constitutional: Negative.  Negative for fever, chills, weight loss, diaphoresis, activity change, appetite change, fatigue and unexpected weight change.  HENT: Negative.   Eyes: Negative.   Respiratory: Negative.  Negative for apnea, cough, choking, chest tightness, shortness of breath, wheezing and stridor.   Cardiovascular: Negative.  Negative for chest pain, palpitations and leg swelling.  Gastrointestinal: Positive for abdominal pain and constipation. Negative for nausea, vomiting, diarrhea, blood in stool, melena, hematochezia, abdominal distention, anal bleeding, rectal pain, anorexia and flatus.  Endocrine: Negative.   Genitourinary: Negative.  Negative for dysuria, urgency, frequency, hematuria, flank pain, enuresis, difficulty urinating and pelvic pain.  Musculoskeletal: Negative.  Negative for arthralgias, back pain and myalgias.  Skin:  Negative.  Negative for rash.  Allergic/Immunologic: Negative.   Neurological: Negative.  Negative for headaches.  Hematological: Negative.  Negative for adenopathy. Does not bruise/bleed easily.  Psychiatric/Behavioral: Negative.        Objective:   Physical Exam  Vitals reviewed. Constitutional: She is oriented to person, place, and time. She appears well-developed and well-nourished.  Non-toxic appearance. She does not have a sickly appearance. She does not appear ill. No distress.  HENT:  Head: Normocephalic and atraumatic.  Mouth/Throat: Oropharynx is clear and moist. No oropharyngeal exudate.  Eyes: Conjunctivae are normal. Right eye exhibits no discharge. Left eye exhibits no discharge. No scleral icterus.  Neck: Normal range of motion. Neck supple. No JVD present. No tracheal deviation present. No thyromegaly present.  Cardiovascular: Normal rate, regular rhythm, normal heart sounds and intact distal pulses.  Exam reveals no gallop and no friction rub.   No murmur heard. Pulmonary/Chest: Effort normal and breath sounds normal. No stridor. No respiratory distress. She has no wheezes. She has no rales. She exhibits no tenderness.  Abdominal: Soft. Normal appearance and bowel sounds are normal. She exhibits no shifting dullness, no distension, no pulsatile liver, no fluid wave, no abdominal bruit, no ascites, no pulsatile midline mass and no mass. There is no hepatosplenomegaly, splenomegaly or hepatomegaly. There is no tenderness. There is no rebound, no guarding and no CVA tenderness. No hernia. Hernia confirmed negative in the ventral area, confirmed negative in the right inguinal area and confirmed negative in the left inguinal area.  Genitourinary: Rectal exam shows external hemorrhoid. Rectal exam shows no internal hemorrhoid, no fissure, no mass, no tenderness and anal tone normal. Guaiac negative stool.  Musculoskeletal: Normal range of motion. She exhibits no edema and no  tenderness.  Lymphadenopathy:    She has no cervical  adenopathy.  Neurological: She is oriented to person, place, and time.  Skin: Skin is warm and dry. No rash noted. She is not diaphoretic. No erythema. No pallor.     Lab Results  Component Value Date   WBC 6.4 01/13/2014   HGB 13.3 01/13/2014   HCT 40.5 01/13/2014   PLT 244.0 01/13/2014   GLUCOSE 91 01/13/2014   CHOL 186 11/26/2013   TRIG 69.0 11/26/2013   HDL 62.70 11/26/2013   LDLDIRECT 124.2 11/02/2012   LDLCALC 110* 11/26/2013   ALT 18 01/13/2014   AST 21 01/13/2014   NA 139 01/13/2014   K 3.9 01/13/2014   CL 105 01/13/2014   CREATININE 0.8 01/13/2014   BUN 18 01/13/2014   CO2 28 01/13/2014   TSH 1.08 11/26/2013       Assessment & Plan:

## 2014-01-13 NOTE — Progress Notes (Signed)
Pre visit review using our clinic review tool, if applicable. No additional management support is needed unless otherwise documented below in the visit note. 

## 2014-01-14 ENCOUNTER — Encounter: Payer: Self-pay | Admitting: Internal Medicine

## 2014-01-14 NOTE — Assessment & Plan Note (Signed)
She was given amitiza 2 days ago but has not started it yet I asked her to start this if she has constipation

## 2014-01-14 NOTE — Assessment & Plan Note (Signed)
Exam, plain film, and all labs are normal Will get an U/S done to see if there are gallstones or any other organic pathology

## 2014-01-20 ENCOUNTER — Ambulatory Visit
Admission: RE | Admit: 2014-01-20 | Discharge: 2014-01-20 | Disposition: A | Discharge status: Home / Self Care | Payer: BC Managed Care – PPO | Source: Ambulatory Visit | Attending: Internal Medicine | Admitting: Internal Medicine

## 2014-01-20 DIAGNOSIS — K5909 Other constipation: Secondary | ICD-10-CM

## 2014-01-20 DIAGNOSIS — R109 Unspecified abdominal pain: Secondary | ICD-10-CM

## 2014-01-21 ENCOUNTER — Encounter: Payer: Self-pay | Admitting: Internal Medicine

## 2014-02-18 ENCOUNTER — Ambulatory Visit (INDEPENDENT_AMBULATORY_CARE_PROVIDER_SITE_OTHER): Payer: BC Managed Care – PPO | Admitting: Internal Medicine

## 2014-02-18 ENCOUNTER — Encounter: Payer: Self-pay | Admitting: Internal Medicine

## 2014-02-18 ENCOUNTER — Ambulatory Visit (INDEPENDENT_AMBULATORY_CARE_PROVIDER_SITE_OTHER): Payer: BC Managed Care – PPO

## 2014-02-18 VITALS — BP 120/80 | HR 88 | Temp 98.6°F | Resp 13 | Wt 123.1 lb

## 2014-02-18 DIAGNOSIS — Z86010 Personal history of colonic polyps: Secondary | ICD-10-CM

## 2014-02-18 DIAGNOSIS — K7689 Other specified diseases of liver: Secondary | ICD-10-CM | POA: Insufficient documentation

## 2014-02-18 DIAGNOSIS — K5909 Other constipation: Secondary | ICD-10-CM

## 2014-02-18 DIAGNOSIS — K5900 Constipation, unspecified: Secondary | ICD-10-CM

## 2014-02-18 DIAGNOSIS — Z23 Encounter for immunization: Secondary | ICD-10-CM

## 2014-02-18 NOTE — Progress Notes (Signed)
Pre visit review using our clinic review tool, if applicable. No additional management support is needed unless otherwise documented below in the visit note. 

## 2014-02-18 NOTE — Patient Instructions (Signed)
Increase roughage (fruits and vegetables) and your diet as recommended. Drink to thirst up to 40 ounces of water a day. Metamucil or other such agents may help the constipation. Take MiraLax every third night as needed for constipation. Please take a probiotic , Florastor OR Align, every day until the bowels are back to normal. This will replace the normal bacteria which  are necessary for formation of normal stool and processing of food.

## 2014-02-18 NOTE — Progress Notes (Signed)
   Subjective:    Patient ID: Mikayla Washington, female    DOB: [birth year], 68 y.o.   MRN: [id number]  HPI    She had severe constipation after her flight to [place] in August. Extensive evaluation was completed. Complete chemistries and blood counts were normal. Ultrasound showed a 9 mm hepatic cyst  She did have a colonoscopy in July 2013. That revealed patent bowel with a small adenoma. This proved to be a tubular adenoma.  She took Colace twice a day and MiraLAX twice a day with improvement. Amitiza was filled but not taken as she was concerned about "bowel blockage" possibility as per package insert.She now has a bowel movement every other day. She still feels she has incomplete elimination.  She has decreased the MiraLAX to once a day as she felt bloated. She has had a total abdominal hysterectomy and bilateral pleurectomy for non-malignancy  She has had constipation in the past in the context of travel.    Review of Systems   With the constipation she did have some small-caliber stools but this has essentially resolved  She denies melena or rectal bleeding.     Objective:   Physical Exam General appearance:very thin but in good health and nourishment w/o distress.  Eyes: No conjunctival inflammation or scleral icterus is present.  Oral exam: Dental hygiene is good; lips and gums are healthy appearing.There is no oropharyngeal erythema or exudate noted.   Heart:  Normal rate and regular rhythm. S1 and S2 normal without gallop, murmur, click, rub or other extra sounds     Lungs:Chest clear to auscultation; no wheezes, rhonchi,rales ,or rubs present.No increased work of breathing.   Abdomen: bowel sounds normal, soft and non-tender without masses, organomegaly or hernias noted.  No guarding or rebound .   Musculoskeletal: Able to lie flat and sit up without help. Negative straight leg raising bilaterally. Gait normal  Skin:Warm & dry.  Intact without suspicious lesions or rashes ;  no jaundice or tenting  Lymphatic: No lymphadenopathy is noted about the head, neck, axilla              Assessment & Plan:  #1 constipation variant of IBS See AVS

## 2014-05-31 ENCOUNTER — Encounter: Payer: Self-pay | Admitting: Family Medicine

## 2014-05-31 ENCOUNTER — Ambulatory Visit (INDEPENDENT_AMBULATORY_CARE_PROVIDER_SITE_OTHER): Payer: BC Managed Care – PPO | Admitting: Family Medicine

## 2014-05-31 VITALS — BP 120/78 | HR 93 | Temp 98.6°F | Wt 123.0 lb

## 2014-05-31 DIAGNOSIS — H6980 Other specified disorders of Eustachian tube, unspecified ear: Secondary | ICD-10-CM | POA: Insufficient documentation

## 2014-05-31 DIAGNOSIS — H6993 Unspecified Eustachian tube disorder, bilateral: Secondary | ICD-10-CM

## 2014-05-31 DIAGNOSIS — J029 Acute pharyngitis, unspecified: Secondary | ICD-10-CM

## 2014-05-31 DIAGNOSIS — H6983 Other specified disorders of Eustachian tube, bilateral: Secondary | ICD-10-CM

## 2014-05-31 DIAGNOSIS — B9789 Other viral agents as the cause of diseases classified elsewhere: Principal | ICD-10-CM

## 2014-05-31 DIAGNOSIS — J069 Acute upper respiratory infection, unspecified: Secondary | ICD-10-CM

## 2014-05-31 LAB — POCT RAPID STREP A (OFFICE): Rapid Strep A Screen: NEGATIVE

## 2014-05-31 NOTE — Progress Notes (Signed)
Pre visit review using our clinic review tool, if applicable. No additional management support is needed unless otherwise documented below in the visit note. 

## 2014-05-31 NOTE — Addendum Note (Signed)
Addended by: Lurlean Nanny on: 05/31/2014 10:52 AM   Modules accepted: Orders

## 2014-05-31 NOTE — Assessment & Plan Note (Signed)
Symptomatic care 

## 2014-05-31 NOTE — Progress Notes (Signed)
   Subjective:    Patient ID: Mikayla Washington, female    DOB: 1946-04-04, 69 y.o.   MRN: [id number]  Sore Throat  This is a new problem. The current episode started in the past 7 days. The problem has been gradually worsening. There has been no fever. The pain is severe. Associated symptoms include congestion, swollen glands and trouble swallowing. Pertinent negatives include no plugged ear sensation or shortness of breath. Associated symptoms comments: Bilateral ears. She has had exposure to strep. She has had no exposure to mono. 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 She has tried cool liquids for the symptoms. The treatment provided mild relief.   No chronic lung issues, she has hx of ear infection frequently.   Review of Systems  Constitutional: Positive for fatigue. Negative for chills.  HENT: Positive for congestion and trouble swallowing.   Eyes: Negative for pain.  Respiratory: Negative for shortness of breath.   Cardiovascular: Negative for chest pain.       Objective:   Physical Exam  Constitutional: Vital signs are normal. She appears well-developed and well-nourished. She is cooperative.  Non-toxic appearance. She does not appear ill. No distress.  HENT:  Head: Normocephalic.  Right Ear: Hearing, tympanic membrane, external ear and ear canal normal. Tympanic membrane is not erythematous, not retracted and not bulging. No middle ear effusion.  Left Ear: Hearing, tympanic membrane, external ear and ear canal normal. Tympanic membrane is not erythematous, not retracted and not bulging.  No middle ear effusion.  Nose: Mucosal edema and rhinorrhea present. Right sinus exhibits no maxillary sinus tenderness and no frontal sinus tenderness. Left sinus exhibits no maxillary sinus tenderness and no frontal sinus tenderness.  Mouth/Throat: Uvula is midline and mucous membranes are normal. No uvula swelling. Posterior oropharyngeal erythema present. No oropharyngeal exudate, posterior  oropharyngeal edema or tonsillar abscesses.  Eyes: Conjunctivae, EOM and lids are normal. Pupils are equal, round, and reactive to light. Lids are everted and swept, no foreign bodies found.  Neck: Trachea normal and normal range of motion. Neck supple. Carotid bruit is not present. No thyroid mass and no thyromegaly present.  Cardiovascular: Normal rate, regular rhythm, S1 normal, S2 normal, normal heart sounds, intact distal pulses and normal pulses.  Exam reveals no gallop and no friction rub.   No murmur heard. Pulmonary/Chest: Effort normal and breath sounds normal. No tachypnea. No respiratory distress. She has no decreased breath sounds. She has no wheezes. She has no rhonchi. She has no rales.  Neurological: She is alert.  Skin: Skin is warm, dry and intact. No rash noted.  Psychiatric: Her speech is normal and behavior is normal. Judgment normal. Her mood appears not anxious. Cognition and memory are normal. She does not exhibit a depressed mood.          Assessment & Plan:

## 2014-05-31 NOTE — Patient Instructions (Signed)
Mucinex DM for cough Tylenol for sore thraot. Rest, push fluids.  OTC flonase 2 sprays per nostril daily for fluid in ears.

## 2014-05-31 NOTE — Assessment & Plan Note (Signed)
Nasal steroid spray

## 2014-06-07 ENCOUNTER — Encounter: Payer: Self-pay | Admitting: Family

## 2014-06-07 ENCOUNTER — Ambulatory Visit (INDEPENDENT_AMBULATORY_CARE_PROVIDER_SITE_OTHER): Payer: BC Managed Care – PPO | Admitting: Family

## 2014-06-07 VITALS — BP 118/78 | HR 83 | Temp 98.3°F | Ht 64.5 in | Wt 122.8 lb

## 2014-06-07 DIAGNOSIS — J019 Acute sinusitis, unspecified: Secondary | ICD-10-CM

## 2014-06-07 DIAGNOSIS — J309 Allergic rhinitis, unspecified: Secondary | ICD-10-CM

## 2014-06-07 MED ORDER — AMOXICILLIN-POT CLAVULANATE 875-125 MG PO TABS: 1.0000 | ORAL_TABLET | Freq: Two times a day (BID) | ORAL | Status: DC

## 2014-06-07 MED ORDER — FLUTICASONE PROPIONATE 50 MCG/ACT NA SUSP: 2.0000 | Freq: Every day | NASAL | Status: DC

## 2014-06-07 MED ORDER — BENZONATATE 100 MG PO CAPS: 100.0000 mg | ORAL_CAPSULE | Freq: Three times a day (TID) | ORAL | Status: DC | PRN

## 2014-06-07 MED ORDER — LORATADINE 10 MG PO TABS: 10.0000 mg | ORAL_TABLET | Freq: Every day | ORAL | Status: DC

## 2014-06-07 NOTE — Progress Notes (Signed)
Subjective:    Patient ID: Mikayla Washington, female    DOB: [birth year], 69 y.o.   MRN: [id number]  HPI  Mikayla Washington is a 69 yr old female who presents today with chief complaint of cough. Reports that cough has been present x 1 month. Reports that she has copious nasal drainage which is clear.  Notes similar symptoms each spring and fall. Mild throat irritation.  She is using mucinex Dm, tylenol for sore throat and flonase. She has been using flonase for a few weeks but has helped some with her cough.can cough so hard that she gags.  She denies current associated fever. She reports fever last week tmax 100. She reports associated + sinus pressure.  She is a non-smoker.    Review of Systems See HPI  Past Medical History  Diagnosis Date  . DJD (degenerative joint disease)   . Breast disease     fibrocystic  . Adenomatous colon polyp 2013    Dr Sharlett Iles    History   Social History  . Marital Status: Married    Spouse Name: N/A  . Number of Children: N/A  . Years of Education: N/A   Occupational History  . Not on file.   Social History Main Topics  . Smoking status: Never Smoker   . Smokeless tobacco: Never Used  . Alcohol Use: No     Comment: rare, 1 x per year or at celebrations   . Drug Use: No  . Sexual Activity: Not Currently   Other Topics Concern  . Not on file   Social History Narrative    Past Surgical History  Procedure Laterality Date  . Tonsillectomy    . Breast lumpectomy      X 3  . Total abdominal hysterectomy w/ bilateral salpingoophorectomy  2001     benign ovarian tumor  . Colonoscopy  2002     Dr Sharlett Iles, negative  . Colonoscopy with polypectomy  2013    adenomatous polyp    Family History  Problem Relation Age of Onset  . Adopted: Yes    No Known Allergies  Current Outpatient Prescriptions on File Prior to Visit  Medication Sig Dispense Refill  . aspirin 81 MG tablet Take 81 mg by mouth daily.    . Cholecalciferol (VITAMIN D-3 PO) Take 1  tablet by mouth daily.    . Multiple Vitamins-Minerals (CENTRUM SILVER PO) Take 1 tablet by mouth daily.     No current facility-administered medications on file prior to visit.    BP 118/78 mmHg  Pulse 83  Temp(Src) 98.3 F (36.8 C) (Oral)  Ht 5' 4.5" (1.638 m)  Wt 122 lb 12 oz (55.679 kg)  BMI 20.75 kg/m2  SpO2 97%       Objective:   Physical Exam  Constitutional: She appears well-developed and well-nourished.  HENT:  Right Ear: Tympanic membrane and ear canal normal.  Left Ear: Tympanic membrane and ear canal normal.  Mouth/Throat: No oropharyngeal exudate, posterior oropharyngeal edema or posterior oropharyngeal erythema.  Cardiovascular: Normal rate, regular rhythm and normal heart sounds.   No murmur heard. Pulmonary/Chest: Effort normal and breath sounds normal. No respiratory distress. She has no wheezes.  Psychiatric: She has a normal mood and affect. Her behavior is normal. Judgment and thought content normal.          Assessment & Plan:  Symptoms most consistent with allergic rhinitis which has now gone into sinusitis.  Cough is secondary to copious drainage. Advised pt  as follows:  Start augmentin, start tessalon as needed for cough, add claritin 10mg  once daily, continue flonase. You will be contacted about your referral to the allergist. Please call if your symptoms worsen or if not improved in 3-4 days.

## 2014-06-07 NOTE — Patient Instructions (Signed)
Start augmentin, start tessalon as needed for cough, add claritin 10mg  once daily, continue flonase. You will be contacted about your referral to the allergist. Please call if your symptoms worsen or if not improved in 3-4 days.

## 2014-06-07 NOTE — Progress Notes (Signed)
Pre visit review using our clinic review tool, if applicable. No additional management support is needed unless otherwise documented below in the visit note. 

## 2014-07-16 ENCOUNTER — Encounter: Payer: Self-pay | Admitting: Internal Medicine

## 2014-09-16 ENCOUNTER — Encounter: Payer: Self-pay | Admitting: Gastroenterology

## 2014-11-28 ENCOUNTER — Ambulatory Visit (INDEPENDENT_AMBULATORY_CARE_PROVIDER_SITE_OTHER): Payer: BC Managed Care – PPO | Admitting: Internal Medicine

## 2014-11-28 ENCOUNTER — Other Ambulatory Visit (INDEPENDENT_AMBULATORY_CARE_PROVIDER_SITE_OTHER): Payer: BC Managed Care – PPO

## 2014-11-28 ENCOUNTER — Encounter: Payer: Self-pay | Admitting: Internal Medicine

## 2014-11-28 VITALS — BP 100/62 | HR 77 | Temp 98.5°F | Resp 16 | Ht 63.5 in | Wt 126.0 lb

## 2014-11-28 DIAGNOSIS — Z0000 Encounter for general adult medical examination without abnormal findings: Secondary | ICD-10-CM | POA: Diagnosis not present

## 2014-11-28 DIAGNOSIS — Z0189 Encounter for other specified special examinations: Secondary | ICD-10-CM | POA: Diagnosis not present

## 2014-11-28 DIAGNOSIS — J3089 Other allergic rhinitis: Secondary | ICD-10-CM

## 2014-11-28 DIAGNOSIS — Z23 Encounter for immunization: Secondary | ICD-10-CM

## 2014-11-28 DIAGNOSIS — J309 Allergic rhinitis, unspecified: Secondary | ICD-10-CM | POA: Insufficient documentation

## 2014-11-28 LAB — CBC WITH DIFFERENTIAL/PLATELET
BASOS ABS: 0 10*3/uL (ref 0.0–0.1)
Basophils Relative: 0.5 % (ref 0.0–3.0)
Eosinophils Absolute: 0.2 10*3/uL (ref 0.0–0.7)
Eosinophils Relative: 3.1 % (ref 0.0–5.0)
HEMATOCRIT: 40.8 % (ref 36.0–46.0)
HEMOGLOBIN: 13.4 g/dL (ref 12.0–15.0)
LYMPHS ABS: 2.3 10*3/uL (ref 0.7–4.0)
Lymphocytes Relative: 34.2 % (ref 12.0–46.0)
MCHC: 32.9 g/dL (ref 30.0–36.0)
MCV: 89.4 fl (ref 78.0–100.0)
MONO ABS: 0.5 10*3/uL (ref 0.1–1.0)
Monocytes Relative: 7 % (ref 3.0–12.0)
NEUTROS PCT: 55.2 % (ref 43.0–77.0)
Neutro Abs: 3.7 10*3/uL (ref 1.4–7.7)
PLATELETS: 224 10*3/uL (ref 150.0–400.0)
RBC: 4.56 Mil/uL (ref 3.87–5.11)
RDW: 14.4 % (ref 11.5–15.5)
WBC: 6.6 10*3/uL (ref 4.0–10.5)

## 2014-11-28 LAB — LIPID PANEL
Cholesterol: 196 mg/dL (ref 0–200)
HDL: 65.7 mg/dL (ref >39.00)
LDL Cholesterol: 112 mg/dL — ABNORMAL HIGH (ref 0–99)
NonHDL: 129.98
Total CHOL/HDL Ratio: 3
Triglycerides: 88 mg/dL (ref 0.0–149.0)
VLDL: 17.6 mg/dL (ref 0.0–40.0)

## 2014-11-28 LAB — HEPATIC FUNCTION PANEL
ALK PHOS: 81 U/L (ref 39–117)
ALT: 10 U/L (ref 0–35)
AST: 14 U/L (ref 0–37)
Albumin: 4.3 g/dL (ref 3.5–5.2)
BILIRUBIN DIRECT: 0.1 mg/dL (ref 0.0–0.3)
TOTAL PROTEIN: 7 g/dL (ref 6.0–8.3)
Total Bilirubin: 0.4 mg/dL (ref 0.2–1.2)

## 2014-11-28 LAB — BASIC METABOLIC PANEL
BUN: 19 mg/dL (ref 6–23)
CALCIUM: 9.6 mg/dL (ref 8.4–10.5)
CO2: 30 mEq/L (ref 19–32)
CREATININE: 0.79 mg/dL (ref 0.40–1.20)
Chloride: 104 mEq/L (ref 96–112)
GFR: 76.71 mL/min (ref >60.00)
Glucose, Bld: 99 mg/dL (ref 70–99)
Potassium: 4.7 mEq/L (ref 3.5–5.1)
Sodium: 140 mEq/L (ref 135–145)

## 2014-11-28 LAB — VITAMIN D 25 HYDROXY (VIT D DEFICIENCY, FRACTURES): VITD: 27.9 ng/mL — AB (ref 30.00–100.00)

## 2014-11-28 LAB — TSH: TSH: 1.1 u[IU]/mL (ref 0.35–4.50)

## 2014-11-28 NOTE — Progress Notes (Signed)
Pre visit review using our clinic review tool, if applicable. No additional management support is needed unless otherwise documented below in the visit note. 

## 2014-11-28 NOTE — Progress Notes (Signed)
   Subjective:    Patient ID: Mikayla Washington, female    DOB: 1946/01/03, 69 y.o.   MRN: [id number]  HPI She is here for a physical;acute issues include bowel changes with alternating loose stool and constipation. She has a diagnosis of irritable bowel syndrome.  She also has significant rhinitis symptoms during the school year. Allergy testing was positive for mold, mildew, cats,& grass. She declined to take shots. She has no extrinsic or upper respiratory tract infection symptoms at this time.  She's on a heart healthy, low-sodium diet. She walks 2 miles 3 times a week without cardio pulmonary symptoms.  Colonoscopy was completed 2013; tubular adenoma was removed. She is due for survelliance in 2018.   She does have some cold intolerance.  Review of Systems  Chest pain, palpitations, tachycardia, exertional dyspnea, paroxysmal nocturnal dyspnea, claudication or edema are absent. No unexplained weight loss, abdominal pain, significant dyspepsia, dysphagia, melena, rectal bleeding, or persistently small caliber stools. Dysuria, pyuria, hematuria, frequency, nocturia or polyuria are denied. Change in hair, skin, nails denied. No bowel changes of constipation or diarrhea. No intolerance to heat.      Objective:   Physical Exam Pertinent or positive findings include: She has shotty posterior cervical lymphadenopathy bilaterally. She has minimal crepitus of the knees. The dorsalis pedis pulses are slightly decreased  General appearance : Thin but adequately nourished; in no distress.  Eyes: No conjunctival inflammation or scleral icterus is present.  Oral exam:  Lips and gums are healthy appearing.There is no oropharyngeal erythema or exudate noted. Dental hygiene is good.  Heart:  Normal rate and regular rhythm. S1 and S2 normal without gallop, murmur, click, rub or other extra sounds    Lungs:Chest clear to auscultation; no wheezes, rhonchi,rales ,or rubs present.No increased work of  breathing.   Abdomen: bowel sounds normal, soft and non-tender without masses, organomegaly or hernias noted.  No guarding or rebound.   Vascular : all pulses equal ; no bruits present.  Skin:Warm & dry.  Intact without suspicious lesions or rashes ; no tenting or jaundice   Lymphatic: No lymphadenopathy is noted about the head, neck, axilla.   Neuro: Strength, tone & DTRs normal.          Assessment & Plan:  #1 comprehensive physical exam; no acute findings  Plan: see Orders  & Recommendations

## 2014-11-28 NOTE — Patient Instructions (Signed)
  Your next office appointment will be determined based upon review of your pending labs .  Those written interpretation of the lab results and instructions will be transmitted to you by mail for your records.  Critical results will be called.   Followup as needed for any active or acute issue. Please report any significant change in your symptoms.  Colonoscopy due in 2018.   Plain Mucinex (NOT D) for thick secretions ;force NON dairy fluids .   Nasal cleansing in the shower as discussed with lather of mild shampoo.After 10 seconds wash off lather while  exhaling through nostrils. Make sure that all residual soap is removed to prevent irritation.  Flonase OR Nasacort AQ 1 spray in each nostril twice a day as needed. Use the "crossover" technique into opposite nostril spraying toward opposite ear @ 45 degree angle, not straight up into nostril.  Plain Allegra (NOT D )  160 daily , Loratidine 10 mg , OR Zyrtec 10 mg @ bedtime  as needed for itchy eyes & sneezing.

## 2014-12-27 LAB — HM MAMMOGRAPHY: HM Mammogram: NEGATIVE

## 2015-01-02 ENCOUNTER — Encounter: Payer: Self-pay | Admitting: Internal Medicine

## 2015-01-10 ENCOUNTER — Ambulatory Visit (INDEPENDENT_AMBULATORY_CARE_PROVIDER_SITE_OTHER): Payer: BC Managed Care – PPO | Admitting: Family Medicine

## 2015-01-10 ENCOUNTER — Encounter: Payer: Self-pay | Admitting: Family Medicine

## 2015-01-10 VITALS — BP 102/70 | HR 81 | Temp 97.9°F | Ht 61.0 in | Wt 125.0 lb

## 2015-01-10 DIAGNOSIS — J209 Acute bronchitis, unspecified: Secondary | ICD-10-CM | POA: Diagnosis not present

## 2015-01-10 DIAGNOSIS — J018 Other acute sinusitis: Secondary | ICD-10-CM | POA: Diagnosis not present

## 2015-01-10 MED ORDER — AZITHROMYCIN 250 MG PO TABS: ORAL_TABLET | ORAL | Status: DC

## 2015-01-10 NOTE — Patient Instructions (Signed)
Buy generic, over the counter Delsym and use as directed for suppression of cough.

## 2015-01-10 NOTE — Progress Notes (Signed)
OFFICE NOTE  01/10/2015  CC:  Chief Complaint  Patient presents with  . Cough   HPI: Patient is a 69 y.o. Caucasian female who is here for cough. Onset over a week ago, nasal congestion/runny nose, PND cough, coughing spells. No fevers.  Ear fullness and HA, but no ST.  No signif wheezing or chest tightness. Took antihistamine last night--[facility] storebrand.   Pertinent PMH:  PMH and PSH reviewed.  MEDS:  Outpatient Prescriptions Prior to Visit  Medication Sig Dispense Refill  . aspirin 81 MG tablet Take 81 mg by mouth daily.    . Cholecalciferol (VITAMIN D-3 PO) Take 1 tablet by mouth daily.    . fluticasone (FLONASE) 50 MCG/ACT nasal spray Place 2 sprays into both nostrils daily. (Patient taking differently: Place 2 sprays into both nostrils daily as needed. ) 16 g 6  . loratadine (CLARITIN) 10 MG tablet Take 1 tablet (10 mg total) by mouth daily. (Patient taking differently: Take 10 mg by mouth daily as needed. ) 30 tablet 6  . Multiple Vitamins-Minerals (CENTRUM SILVER PO) Take 1 tablet by mouth daily.     No facility-administered medications prior to visit.    PE: Blood pressure 102/70, pulse 81, temperature 97.9 F (36.6 C), height 5\' 1"  (1.549 m), weight 125 lb (56.7 kg). VS: noted--normal. Gen: alert, NAD, NONTOXIC APPEARING. HEENT: eyes without injection, drainage, or swelling.  Ears: EACs clear, TMs with normal light reflex and landmarks.  Nose: Clear rhinorrhea, with some dried, crusty exudate adherent to mildly injected mucosa.  No purulent d/c.  Mild paranasal sinus TTP, L>R.  No facial swelling.  Throat and mouth without focal lesion.  No pharyngial swelling, erythema, or exudate.   Neck: supple, no LAD.   LUNGS: CTA bilat, nonlabored resps.  Occ post-exhalation coughing.  Aeration is good. CV: RRR, no m/r/g. EXT: no c/c/e SKIN: no rash   IMPRESSION AND PLAN:  Acute sinusitis with mild acute bronchitis, no sign of signif RAD component to this. Azith x  5d. Continue symptom management with OTC antihistamine, saline nasal spray, and add generic delsym for cough suppression.  An After Visit Summary was printed and given to the patient.  FOLLOW UP: prn

## 2015-01-19 ENCOUNTER — Telehealth: Payer: Self-pay | Admitting: Internal Medicine

## 2015-01-19 NOTE — Telephone Encounter (Signed)
Error

## 2015-06-13 ENCOUNTER — Ambulatory Visit (INDEPENDENT_AMBULATORY_CARE_PROVIDER_SITE_OTHER): Payer: BC Managed Care – PPO | Admitting: Internal Medicine

## 2015-06-13 ENCOUNTER — Encounter: Payer: Self-pay | Admitting: Internal Medicine

## 2015-06-13 VITALS — BP 100/60 | HR 92 | Temp 98.1°F | Resp 17 | Ht 61.0 in | Wt 126.2 lb

## 2015-06-13 DIAGNOSIS — J069 Acute upper respiratory infection, unspecified: Secondary | ICD-10-CM | POA: Diagnosis not present

## 2015-06-13 DIAGNOSIS — J3089 Other allergic rhinitis: Secondary | ICD-10-CM | POA: Diagnosis not present

## 2015-06-13 DIAGNOSIS — B9789 Other viral agents as the cause of diseases classified elsewhere: Secondary | ICD-10-CM

## 2015-06-13 MED ORDER — BENZONATATE 100 MG PO CAPS: 100.0000 mg | ORAL_CAPSULE | Freq: Two times a day (BID) | ORAL | Status: DC | PRN

## 2015-06-13 MED ORDER — PREDNISONE 10 MG PO TABS: 10.0000 mg | ORAL_TABLET | Freq: Two times a day (BID) | ORAL | Status: DC

## 2015-06-13 NOTE — Progress Notes (Signed)
Pre-visit discussion using our clinic review tool. No additional management support is needed unless otherwise documented below in the visit note.  

## 2015-06-13 NOTE — Patient Instructions (Signed)
Take over-the-counter expectorants and cough medications such as  Mucinex DM.  Call if there is no improvement in 5 to 7 days or if  you develop worsening cough, fever, or new symptoms, such as shortness of breath or chest pain.  HOME CARE INSTRUCTIONS  Drink plenty of water. Water helps thin the mucus so your sinuses can drain more easily.  Use a humidifier.  Inhale steam 3-4 times a day (for example, sit in the bathroom with the shower running).  Apply a warm, moist washcloth to your face 3-4 times a day, or as directed by your health care provider.  Use saline nasal sprays to help moisten and clean your sinuses.  Take medicines only as directed by your health care provider.

## 2015-06-13 NOTE — Progress Notes (Signed)
Subjective:    Patient ID: Mikayla Washington, female    DOB: 26-Mar-1946, 70 y.o.   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  She has been on maintenance fluticasone nasal spray as well as loratadine.  She has been ill for 2 weeks with intermittent sore throat, cough, hoarseness and congestion.  She has noted some mild left ear discomfort.  Cough may be her predominant symptom that has at times interfered with sleep.  She is also quite hoarse, which has interfered with school No fever, chills, shortness of breath, or purulent sputum production  Past Medical History  Diagnosis Date  . DJD (degenerative joint disease)   . Breast disease     fibrocystic  . Adenomatous colon polyp 2013    Dr Sharlett Iles    Social History   Social History  . Marital Status: Married    Spouse Name: N/A  . Number of Children: N/A  . Years of Education: N/A   Occupational History  . Not on file.   Social History Main Topics  . Smoking status: Never Smoker   . Smokeless tobacco: Never Used  . Alcohol Use: No     Comment: rare, 1 x per year or at celebrations   . Drug Use: No  . Sexual Activity: Not Currently   Other Topics Concern  . Not on file   Social History Narrative    Past Surgical History  Procedure Laterality Date  . Tonsillectomy    . Breast lumpectomy      X 3  . Total abdominal hysterectomy w/ bilateral salpingoophorectomy  2001     benign ovarian tumor (BSO also)  . Colonoscopy  2002     Dr Sharlett Iles, negative  . Colonoscopy with polypectomy  2013    adenomatous polyp    Family History  Problem Relation Age of Onset  . Adopted: Yes    No Known Allergies  Current Outpatient Prescriptions on File Prior to Visit  Medication Sig Dispense Refill  . aspirin 81 MG tablet Take 81 mg by mouth daily.    . Cholecalciferol (VITAMIN D-3 PO) Take 1 tablet by mouth daily.    . fluticasone (FLONASE) 50 MCG/ACT nasal spray Place 2 sprays  into both nostrils daily. (Patient taking differently: Place 2 sprays into both nostrils daily as needed. ) 16 g 6  . loratadine (CLARITIN) 10 MG tablet Take 1 tablet (10 mg total) by mouth daily. (Patient taking differently: Take 10 mg by mouth daily as needed. ) 30 tablet 6  . Multiple Vitamins-Minerals (CENTRUM SILVER PO) Take 1 tablet by mouth daily.     No current facility-administered medications on file prior to visit.    BP 100/60 mmHg  Pulse 92  Temp(Src) 98.1 F (36.7 C) (Oral)  Resp 17  Ht 5\' 1"  (1.549 m)  Wt 126 lb 4 oz (57.267 kg)  BMI 23.87 kg/m2  SpO2 98%     Review of Systems  Constitutional: Positive for activity change, appetite change and fatigue. Negative for fever and chills.  HENT: Positive for congestion, sore throat and voice change. Negative for dental problem, hearing loss, rhinorrhea, sinus pressure and tinnitus.   Eyes: Negative for pain, discharge and visual disturbance.  Respiratory: Positive for cough. Negative for shortness of breath and wheezing.   Cardiovascular: Negative for chest pain, palpitations and leg swelling.  Gastrointestinal: Negative for nausea, vomiting, abdominal pain, diarrhea, constipation, blood in stool and abdominal distention.  Genitourinary: Negative for dysuria, urgency, frequency, hematuria, flank pain, vaginal bleeding, vaginal discharge, difficulty urinating, vaginal pain and pelvic pain.  Musculoskeletal: Negative for joint swelling, arthralgias and gait problem.  Skin: Negative for rash.  Neurological: Negative for dizziness, syncope, speech difficulty, weakness, numbness and headaches.  Hematological: Negative for adenopathy.  Psychiatric/Behavioral: Negative for behavioral problems, dysphoric mood and agitation. The patient is not nervous/anxious.        Objective:   Physical Exam  Constitutional: She is oriented to person, place, and time. She appears well-developed and well-nourished. No distress.  Afebrile No  distress O2 saturation 98%  hoarse  HENT:  Head: Normocephalic.  Right Ear: External ear normal.  Left Ear: External ear normal.  Mouth/Throat: Oropharynx is clear and moist.  Eyes: Conjunctivae and EOM are normal. Pupils are equal, round, and reactive to light.  Neck: Normal range of motion. Neck supple. No thyromegaly present.  Cardiovascular: Normal rate, regular rhythm, normal heart sounds and intact distal pulses.   Pulmonary/Chest: Effort normal and breath sounds normal. No respiratory distress. She has no wheezes. She has no rales.  Abdominal: Soft. Bowel sounds are normal. She exhibits no mass. There is no tenderness.  Musculoskeletal: Normal range of motion.  Lymphadenopathy:    She has no cervical adenopathy.  Neurological: She is alert and oriented to person, place, and time.  Skin: Skin is warm and dry. No rash noted.  Psychiatric: She has a normal mood and affect. Her behavior is normal.          Assessment & Plan:   Viral URI with cough Allergic rhinitis  Will treat symptoms aggressively Add Tessalon

## 2016-01-06 ENCOUNTER — Encounter: Payer: Self-pay | Admitting: Nurse Practitioner

## 2016-02-09 ENCOUNTER — Ambulatory Visit: Payer: BC Managed Care – PPO | Admitting: Nurse Practitioner

## 2016-02-22 ENCOUNTER — Ambulatory Visit (INDEPENDENT_AMBULATORY_CARE_PROVIDER_SITE_OTHER): Payer: Medicare Other | Admitting: Family Medicine

## 2016-02-22 ENCOUNTER — Encounter: Payer: Self-pay | Admitting: Family Medicine

## 2016-02-22 VITALS — BP 100/67 | HR 86 | Ht 61.0 in | Wt 130.7 lb

## 2016-02-22 DIAGNOSIS — Z90710 Acquired absence of both cervix and uterus: Secondary | ICD-10-CM | POA: Diagnosis not present

## 2016-02-22 DIAGNOSIS — K649 Unspecified hemorrhoids: Secondary | ICD-10-CM

## 2016-02-22 DIAGNOSIS — Z860101 Personal history of adenomatous and serrated colon polyps: Secondary | ICD-10-CM

## 2016-02-22 DIAGNOSIS — Z789 Other specified health status: Secondary | ICD-10-CM

## 2016-02-22 DIAGNOSIS — Z86010 Personal history of colonic polyps: Secondary | ICD-10-CM

## 2016-02-22 DIAGNOSIS — E559 Vitamin D deficiency, unspecified: Secondary | ICD-10-CM

## 2016-02-22 DIAGNOSIS — M8589 Other specified disorders of bone density and structure, multiple sites: Secondary | ICD-10-CM | POA: Diagnosis not present

## 2016-02-22 DIAGNOSIS — Z90721 Acquired absence of ovaries, unilateral: Secondary | ICD-10-CM

## 2016-02-22 DIAGNOSIS — E7800 Pure hypercholesterolemia, unspecified: Secondary | ICD-10-CM

## 2016-02-22 DIAGNOSIS — Z0282 Encounter for adoption services: Secondary | ICD-10-CM | POA: Insufficient documentation

## 2016-02-22 DIAGNOSIS — E7889 Other lipoprotein metabolism disorders: Secondary | ICD-10-CM | POA: Insufficient documentation

## 2016-02-22 NOTE — Progress Notes (Signed)
New patient office visit note:  Impression and Recommendations:    1. Osteopenia of multiple sites   2. Status post hysterectomy   3. Hx of adenomatous colonic polyps   4. Hemorrhoids, unspecified hemorrhoid type   5. Elevated HDL   6. Pure hypercholesterolemia   7. Vitamin D deficiency   8. Adopted person    Osteopenia Patient was supposed to go for DEXA scan repeat study many years ago and was lost to follow-up.  Ordered one today. Handed order to patient so she could go to [facility] to get it done  HLD (hyperlipidemia) We'll check fasting lipid profile next office visit  Elevated HDL Continue to walk 30 minutes daily  Vitamin D deficiency We'll check vitamin D levels in near future  Adopted person Unknown family history  RTC for yearly complete physical exam and appropriate screening tests etc, in addition to chronic disease mgt  Orders Placed This Encounter  Procedures  . DG Bone Density  . CBC with Differential/Platelet  . COMPLETE METABOLIC PANEL WITH GFR  . Lipid Panel w/reflex Direct LDL  . Hemoglobin A1c  . T4, free  . TSH  . VITAMIN D 25 Hydroxy (Vit-D Deficiency, Fractures)   Med list was updated by CMA today Patient's Medications  New Prescriptions   No medications on file  Previous Medications   ASPIRIN 81 MG TABLET    Take 81 mg by mouth daily.   CHOLECALCIFEROL (VITAMIN D-3 PO)    Take 1,000 Int'l Units by mouth daily.    MULTIPLE VITAMINS-MINERALS (CENTRUM SILVER PO)    Take 1 tablet by mouth daily.  Modified Medications   No medications on file  Discontinued Medications   BENZONATATE (TESSALON) 100 MG CAPSULE    Take 1 capsule (100 mg total) by mouth 2 (two) times daily as needed for cough.   FLUTICASONE (FLONASE) 50 MCG/ACT NASAL SPRAY    Place 2 sprays into both nostrils daily.   LORATADINE (CLARITIN) 10 MG TABLET    Take 1 tablet (10 mg total) by mouth daily.   PREDNISONE (DELTASONE) 10 MG TABLET    Take 1 tablet (10 mg total)  by mouth 2 (two) times daily with a meal.    Return for For wellness exam & health maintenance evaluation, Fasting blood work.  The patient was counseled, risk factors were discussed, anticipatory guidance given.  Please see AVS handed out to patient at the end of our visit for further patient instructions/ counseling done pertaining to today's office visit.    Note: This document was prepared using Dragon voice recognition software and may include unintentional dictation errors.  ----------------------------------------------------------------------------------------------------------------------    Subjective:    Chief Complaint  Patient presents with  . Establish Care    HPI: Mikayla Washington is a pleasant 70 y.o. female who presents to [place] [facility] today to review their medical history with me and establish care.   I asked the patient to review their chronic problem list with me to ensure everything was updated and accurate.     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  August 2016- last labs that patient has had.   Doesn't go to GYN anymore- but gets Mammo's, BMD Dexa ordered today.  Review of systems is grossly negative   Patient Care Team    Relationship Specialty Notifications Start End  Mellody Dance, DO PCP - General Family Medicine  02/22/16   Calvert Cantor, MD Consulting Physician Ophthalmology  02/22/16   Sable Feil, MD Consulting Physician Gastroenterology  02/22/16    Comment: Rudolpho Sevin     Wt Readings from Last 3 Encounters:  02/22/16 130 lb 11.2 oz (59.3 kg)  06/13/15 126 lb 4 oz (57.3 kg)  01/10/15 125 lb (56.7 kg)   BP Readings from Last 3 Encounters:  02/22/16 100/67  06/13/15 100/60  01/10/15 102/70   Pulse Readings from Last 3 Encounters:  02/22/16 86  06/13/15 92  01/10/15 81   BMI Readings from Last 3 Encounters:  02/22/16 24.70 kg/m  06/13/15  23.85 kg/m  01/10/15 23.62 kg/m     Patient Active Problem List   Diagnosis Date Noted  . Status post hysterectomy with oophorectomy in 2000 02/22/2016  . Hemorrhoids 02/22/2016  . Elevated HDL 02/22/2016  . Adopted person 02/22/2016  . Allergic rhinitis 11/28/2014  . Eustachian tube dysfunction 05/31/2014  . Liver cyst 02/18/2014  . Chronic constipation 01/11/2014  . HLD (hyperlipidemia) 11/26/2013  . Hx of adenomatous colonic polyps 11/02/2012  . Vitamin D deficiency 11/02/2012  . Osteopenia 11/02/2012  . Irritable bowel syndrome (IBS) 11/02/2012  . Tinnitus, subjective 11/02/2012  . POSTMENOPAUSAL SYNDROME 11/03/2008  . DEGENERATIVE JOINT DISEASE 11/03/2008     Past Medical History:  Diagnosis Date  . Adenomatous colon polyp 2013   Dr Sharlett Iles  . Breast disease    fibrocystic  . DJD (degenerative joint disease)      Past Surgical History:  Procedure Laterality Date  . ABDOMINAL HYSTERECTOMY    . BREAST LUMPECTOMY     X 3  . COLONOSCOPY  2002    Dr Sharlett Iles, negative  . colonoscopy with polypectomy  2013   adenomatous polyp  . TONSILLECTOMY    . TOTAL ABDOMINAL HYSTERECTOMY W/ BILATERAL SALPINGOOPHORECTOMY  2001    benign ovarian tumor (BSO also)     Family History  Problem Relation Age of Onset  . Adopted: Yes     History  Drug Use No    History  Alcohol Use No    Comment: rare, 1 x per year or at celebrations     History  Smoking Status  . Never Smoker  Smokeless Tobacco  . Never Used    Patient's Medications  New Prescriptions   No medications on file  Previous Medications   ASPIRIN 81 MG TABLET    Take 81 mg by mouth daily.   CHOLECALCIFEROL (VITAMIN D-3 PO)    Take 1,000 Int'l Units by mouth daily.    MULTIPLE VITAMINS-MINERALS (CENTRUM SILVER PO)    Take 1 tablet by mouth daily.  Modified Medications   No medications on file  Discontinued Medications   BENZONATATE (TESSALON) 100 MG CAPSULE    Take 1 capsule (100 mg total)  by mouth 2 (two) times daily as needed for cough.   FLUTICASONE (FLONASE) 50 MCG/ACT NASAL SPRAY    Place 2 sprays into both nostrils daily.   LORATADINE (CLARITIN) 10 MG TABLET    Take 1 tablet (10 mg total) by mouth daily.   PREDNISONE (DELTASONE) 10 MG TABLET    Take 1 tablet (10 mg total) by mouth 2 (two) times daily with a meal.    Allergies: Patient has no known allergies.  Review of Systems  Constitutional: Negative.  Negative for chills, diaphoresis, fever, malaise/fatigue and weight loss.  HENT: Negative.  Negative for congestion, sore throat and tinnitus.   Eyes: Negative.  Negative for  blurred vision, double vision and photophobia.  Respiratory: Negative.  Negative for cough and wheezing.   Cardiovascular: Negative.  Negative for chest pain and palpitations.  Gastrointestinal: Negative.  Negative for blood in stool, diarrhea, nausea and vomiting.  Genitourinary: Negative.  Negative for dysuria, frequency and urgency.  Musculoskeletal: Negative.  Negative for joint pain and myalgias.  Skin: Negative.  Negative for itching and rash.  Neurological: Negative.  Negative for dizziness, focal weakness, weakness and headaches.  Endo/Heme/Allergies: Negative.  Negative for environmental allergies and polydipsia. Does not bruise/bleed easily.  Psychiatric/Behavioral: Negative.  Negative for depression and memory loss. The patient is not nervous/anxious and does not have insomnia.      Objective:   Blood pressure 100/67, pulse 86, height 5\' 1"  (1.549 m), weight 130 lb 11.2 oz (59.3 kg). Body mass index is 24.7 kg/m. General: Well Developed, well nourished, and in no acute distress.  Neuro: Alert and oriented x3, extra-ocular muscles intact, sensation grossly intact.  HEENT: Normocephalic, atraumatic, pupils equal round reactive to light, neck supple, No bruit Skin: no gross rashes  Cardiac: Regular rate and rhythm, no murmurs rubs or gallops.  Respiratory: Essentially clear to  auscultation bilaterally. Not using accessory muscles, speaking in full sentences.  Abdominal: Soft, not grossly distended Musculoskeletal: Ambulates w/o diff, FROM * 4 ext.  Vasc: less 2 sec cap RF, warm and pink  Psych:  No HI/SI, judgement and insight good, Euthymic mood. Full Affect.

## 2016-02-22 NOTE — Assessment & Plan Note (Addendum)
Patient was supposed to go for DEXA scan repeat study many years ago and was lost to follow-up.  Ordered one today. Handed order to patient so she could go to [place] to get it done

## 2016-02-22 NOTE — Assessment & Plan Note (Signed)
We'll check fasting lipid profile next office visit

## 2016-02-22 NOTE — Assessment & Plan Note (Signed)
Continue to walk 30 minutes daily

## 2016-02-22 NOTE — Assessment & Plan Note (Signed)
We'll check vitamin D levels in near future

## 2016-02-22 NOTE — Patient Instructions (Signed)
Please realize, EXERCISE IS MEDICINE!  -  American Heart Association [facility]) guidelines for exercise : If you are in good health, without any medical conditions, you should engage in 150 minutes of moderate intensity aerobic activity per week.  This means you should be huffing and puffing throughout your workout.   Engaging in regular exercise will improve brain function and memory, as well as improve mood, boost immune system and help with weight management.  As well as the other, more well-known effects of exercise such as decreasing blood sugar levels, decreasing blood pressure,  and decreasing bad cholesterol levels/ increasing good cholesterol levels.     -  The AHA strongly endorses consumption of a diet that contains a variety of foods from all the food categories with an emphasis on fruits and vegetables; fat-free and low-fat dairy products; cereal and grain products; legumes and nuts; and fish, poultry, and/or extra lean meats.    Excessive food intake, especially of foods high in saturated and trans fats, sugar, and salt, should be avoided.    Adequate water intake of roughly 1/2 of your weight in pounds, should equal the ounces of water per day you should drink.  So for instance, if you're 200 pounds, that would be 100 ounces of water per day.         Mediterranean Diet  Why follow it? Research shows. . Those who follow the Mediterranean diet have a reduced risk of heart disease  . The diet is associated with a reduced incidence of Parkinson's and Alzheimer's diseases . People following the diet may have longer life expectancies and lower rates of chronic diseases  . The Dietary Guidelines for Americans recommends the Mediterranean diet as an eating plan to promote health and prevent disease  What Is the Mediterranean Diet?  . Healthy eating plan based on typical foods and recipes of Mediterranean-style cooking . The diet is primarily a plant based diet; these foods should make up a  majority of meals   Starches - Plant based foods should make up a majority of meals - They are an important sources of vitamins, minerals, energy, antioxidants, and fiber - Choose whole grains, foods high in fiber and minimally processed items  - Typical grain sources include wheat, oats, barley, corn, brown rice, bulgar, farro, millet, polenta, couscous  - Various types of beans include chickpeas, lentils, fava beans, black beans, white beans   Fruits  Veggies - Large quantities of antioxidant rich fruits & veggies; 6 or more servings  - Vegetables can be eaten raw or lightly drizzled with oil and cooked  - Vegetables common to the traditional Mediterranean Diet include: artichokes, arugula, beets, broccoli, brussel sprouts, cabbage, carrots, celery, collard greens, cucumbers, eggplant, kale, leeks, lemons, lettuce, mushrooms, okra, onions, peas, peppers, potatoes, pumpkin, radishes, rutabaga, shallots, spinach, sweet potatoes, turnips, zucchini - Fruits common to the Mediterranean Diet include: apples, apricots, avocados, cherries, clementines, dates, figs, grapefruits, grapes, melons, nectarines, oranges, peaches, pears, pomegranates, strawberries, tangerines  Fats - Replace butter and margarine with healthy oils, such as olive oil, canola oil, and tahini  - Limit nuts to no more than a handful a day  - Nuts include walnuts, almonds, pecans, pistachios, pine nuts  - Limit or avoid candied, honey roasted or heavily salted nuts - Olives are central to the Mediterranean diet - can be eaten whole or used in a variety of dishes   Meats Protein - Limiting red meat: no more than a few times a month -  When eating red meat: choose lean cuts and keep the portion to the size of deck of cards - Eggs: approx. 0 to 4 times a week  - Fish and lean poultry: at least 2 a week  - Healthy protein sources include, chicken, [place], lean beef, lamb - Increase intake of seafood such as tuna, salmon, trout,  mackerel, shrimp, scallops - Avoid or limit high fat processed meats such as sausage and bacon  Dairy - Include moderate amounts of low fat dairy products  - Focus on healthy dairy such as fat free yogurt, skim milk, low or reduced fat cheese - Limit dairy products higher in fat such as whole or 2% milk, cheese, ice cream  Alcohol - Moderate amounts of red wine is ok  - No more than 5 oz daily for women (all ages) and men older than age 46  - No more than 10 oz of wine daily for men younger than 5  Other - Limit sweets and other desserts  - Use herbs and spices instead of salt to flavor foods  - Herbs and spices common to the traditional Mediterranean Diet include: basil, bay leaves, chives, cloves, cumin, fennel, garlic, lavender, marjoram, mint, oregano, parsley, pepper, rosemary, sage, savory, sumac, tarragon, thyme   It's not just a diet, it's a lifestyle:  . The Mediterranean diet includes lifestyle factors typical of those in the region  . Foods, drinks and meals are best eaten with others and savored . Daily physical activity is important for overall good health . This could be strenuous exercise like running and aerobics . This could also be more leisurely activities such as walking, housework, yard-work, or taking the stairs . Moderation is the key; a balanced and healthy diet accommodates most foods and drinks . Consider portion sizes and frequency of consumption of certain foods   Meal Ideas & Options:  . Breakfast:  o Whole wheat toast or whole wheat English muffins with peanut butter & hard boiled egg o Steel cut oats topped with apples & cinnamon and skim milk  o Fresh fruit: banana, strawberries, melon, berries, peaches  o Smoothies: strawberries, bananas, greek yogurt, peanut butter o Low fat greek yogurt with blueberries and granola  o Egg white omelet with spinach and mushrooms o Breakfast couscous: whole wheat couscous, apricots, skim milk, cranberries  . Sandwiches:   o Hummus and grilled vegetables (peppers, zucchini, squash) on whole wheat bread   o Grilled chicken on whole wheat pita with lettuce, tomatoes, cucumbers or tzatziki  o Tuna salad on whole wheat bread: tuna salad made with greek yogurt, olives, red peppers, capers, green onions o Garlic rosemary lamb pita: lamb sauted with garlic, rosemary, salt & pepper; add lettuce, cucumber, greek yogurt to pita - flavor with lemon juice and black pepper  . Seafood:  o Mediterranean grilled salmon, seasoned with garlic, basil, parsley, lemon juice and black pepper o Shrimp, lemon, and spinach whole-grain pasta salad made with low fat greek yogurt  o Seared scallops with lemon orzo  o Seared tuna steaks seasoned salt, pepper, coriander topped with tomato mixture of olives, tomatoes, olive oil, minced garlic, parsley, green onions and cappers  . Meats:  o Herbed greek chicken salad with kalamata olives, cucumber, feta  o Red bell peppers stuffed with spinach, bulgur, lean ground beef (or lentils) & topped with feta   o Kebabs: skewers of chicken, tomatoes, onions, zucchini, squash  o [place] burgers: made with red onions, mint, dill, lemon juice, feta  cheese topped with roasted red peppers . Vegetarian o Cucumber salad: cucumbers, artichoke hearts, celery, red onion, feta cheese, tossed in olive oil & lemon juice  o Hummus and whole grain pita points with a greek salad (lettuce, tomato, feta, olives, cucumbers, red onion) o Lentil soup with celery, carrots made with vegetable broth, garlic, salt and pepper  o Tabouli salad: parsley, bulgur, mint, scallions, cucumbers, tomato, radishes, lemon juice, olive oil, salt and pepper.      Preventive Care for Adults, Female A healthy lifestyle and preventive care can promote health and wellness. Preventive health guidelines for women include the following key practices.   A routine yearly physical is a good way to check with your health care provider about  your health and preventive screening. It is a chance to share any concerns and updates on your health and to receive a thorough exam.   Visit your dentist for a routine exam and preventive care every 6 months. Brush your teeth twice a day and floss once a day. Good oral hygiene prevents tooth decay and gum disease.   The frequency of eye exams is based on your age, health, family medical history, use of contact lenses, and other factors. Follow your health care provider's recommendations for frequency of eye exams.   Eat a healthy diet. Foods like vegetables, fruits, whole grains, low-fat dairy products, and lean protein foods contain the nutrients you need without too many calories. Decrease your intake of foods high in solid fats, added sugars, and salt. Eat the right amount of calories for you.Get information about a proper diet from your health care provider, if necessary.   Regular physical exercise is one of the most important things you can do for your health. Most adults should get at least 150 minutes of moderate-intensity exercise (any activity that increases your heart rate and causes you to sweat) each week. In addition, most adults need muscle-strengthening exercises on 2 or more days a week.   Maintain a healthy weight. The body mass index (BMI) is a screening tool to identify possible weight problems. It provides an estimate of body fat based on height and weight. Your health care provider can find your BMI, and can help you achieve or maintain a healthy weight.For adults 20 years and older:   - A BMI below 18.5 is considered underweight.   - A BMI of 18.5 to 24.9 is normal.   - A BMI of 25 to 29.9 is considered overweight.   - A BMI of 30 and above is considered obese.   Maintain normal blood lipids and cholesterol levels by exercising and minimizing your intake of trans and saturated fats.  Eat a balanced diet with plenty of fruit and vegetables. Blood tests for lipids and  cholesterol should begin at age 66 and be repeated every 5 years minimum.  If your lipid or cholesterol levels are high, you are over 40, or you are at high risk for heart disease, you may need your cholesterol levels checked more frequently.Ongoing high lipid and cholesterol levels should be treated with medicines if diet and exercise are not working.   If you smoke, find out from your health care provider how to quit. If you do not use tobacco, do not start.   Lung cancer screening is recommended for adults aged 65-80 years who are at high risk for developing lung cancer because of a history of smoking. A yearly low-dose CT scan of the lungs is recommended for people who  have at least a 30-pack-year history of smoking and are a current smoker or have quit within the past 15 years. A pack year of smoking is smoking an average of 1 pack of cigarettes a day for 1 year (for example: 1 pack a day for 30 years or 2 packs a day for 15 years). Yearly screening should continue until the smoker has stopped smoking for at least 15 years. Yearly screening should be stopped for people who develop a health problem that would prevent them from having lung cancer treatment.   If you are pregnant, do not drink alcohol. If you are breastfeeding, be very cautious about drinking alcohol. If you are not pregnant and choose to drink alcohol, do not have more than 1 drink per day. One drink is considered to be 12 ounces (355 mL) of beer, 5 ounces (148 mL) of wine, or 1.5 ounces (44 mL) of liquor.   Avoid use of street drugs. Do not share needles with anyone. Ask for help if you need support or instructions about stopping the use of drugs.   High blood pressure causes heart disease and increases the risk of stroke. Your blood pressure should be checked at least yearly.  Ongoing high blood pressure should be treated with medicines if weight loss and exercise do not work.   If you are 80-84 years old, ask your health  care provider if you should take aspirin to prevent strokes.   Diabetes screening involves taking a blood sample to check your fasting blood sugar level. This should be done once every 3 years, after age 41, if you are within normal weight and without risk factors for diabetes. Testing should be considered at a younger age or be carried out more frequently if you are overweight and have at least 1 risk factor for diabetes.   Breast cancer screening is essential preventive care for women. You should practice "breast self-awareness."  This means understanding the normal appearance and feel of your breasts and may include breast self-examination.  Any changes detected, no matter how small, should be reported to a health care provider.  Women in their 64s and 30s should have a clinical breast exam (CBE) by a health care provider as part of a regular health exam every 1 to 3 years.  After age 37, women should have a CBE every year.  Starting at age 41, women should consider having a mammogram (breast X-ray test) every year.  Women who have a family history of breast cancer should talk to their health care provider about genetic screening.  Women at a high risk of breast cancer should talk to their health care providers about having an MRI and a mammogram every year.   -Breast cancer gene (BRCA)-related cancer risk assessment is recommended for women who have family members with BRCA-related cancers. BRCA-related cancers include breast, ovarian, tubal, and peritoneal cancers. Having family members with these cancers may be associated with an increased risk for harmful changes (mutations) in the breast cancer genes BRCA1 and BRCA2. Results of the assessment will determine the need for genetic counseling and BRCA1 and BRCA2 testing.   The Pap test is a screening test for cervical cancer. A Pap test can show cell changes on the cervix that might become cervical cancer if left untreated. A Pap test is a procedure  in which cells are obtained and examined from the lower end of the uterus (cervix).   - Women should have a Pap test starting at age  21.   - Between ages 31 and 84, Pap tests should be repeated every 2 years.   - Beginning at age 72, you should have a Pap test every 3 years as long as the past 3 Pap tests have been normal.   - Some women have medical problems that increase the chance of getting cervical cancer. Talk to your health care provider about these problems. It is especially important to talk to your health care provider if a new problem develops soon after your last Pap test. In these cases, your health care provider may recommend more frequent screening and Pap tests.   - The above recommendations are the same for women who have or have not gotten the vaccine for human papillomavirus (HPV).   - If you had a hysterectomy for a problem that was not cancer or a condition that could lead to cancer, then you no longer need Pap tests. Even if you no longer need a Pap test, a regular exam is a good idea to make sure no other problems are starting.   - If you are between ages 88 and 24 years, and you have had normal Pap tests going back 10 years, you no longer need Pap tests. Even if you no longer need a Pap test, a regular exam is a good idea to make sure no other problems are starting.   - If you have had past treatment for cervical cancer or a condition that could lead to cancer, you need Pap tests and screening for cancer for at least 20 years after your treatment.   - If Pap tests have been discontinued, risk factors (such as a new sexual partner) need to be reassessed to determine if screening should be resumed.   - The HPV test is an additional test that may be used for cervical cancer screening. The HPV test looks for the virus that can cause the cell changes on the cervix. The cells collected during the Pap test can be tested for HPV. The HPV test could be used to screen women aged 91  years and older, and should be used in women of any age who have unclear Pap test results. After the age of 68, women should have HPV testing at the same frequency as a Pap test.   Colorectal cancer can be detected and often prevented. Most routine colorectal cancer screening begins at the age of 34 years and continues through age 31 years. However, your health care provider may recommend screening at an earlier age if you have risk factors for colon cancer. On a yearly basis, your health care provider may provide home test kits to check for hidden blood in the stool.  Use of a small camera at the end of a tube, to directly examine the colon (sigmoidoscopy or colonoscopy), can detect the earliest forms of colorectal cancer. Talk to your health care provider about this at age 96, when routine screening begins. Direct exam of the colon should be repeated every 5 -10 years through age 73 years, unless early forms of pre-cancerous polyps or small growths are found.   People who are at an increased risk for hepatitis B should be screened for this virus. You are considered at high risk for hepatitis B if:  -You were born in a country where hepatitis B occurs often. Talk with your health care provider about which countries are considered high risk.  - Your parents were born in a high-risk country and you have not  received a shot to protect against hepatitis B (hepatitis B vaccine).  - You have HIV or AIDS.  - You use needles to inject street drugs.  - You live with, or have sex with, someone who has Hepatitis B.  - You get hemodialysis treatment.  - You take certain medicines for conditions like cancer, organ transplantation, and autoimmune conditions.   Hepatitis C blood testing is recommended for all people born from [birth year] through 1965 and any individual with known risks for hepatitis C.   Practice safe sex. Use condoms and avoid high-risk sexual practices to reduce the spread of sexually transmitted  infections (STIs). STIs include gonorrhea, chlamydia, syphilis, trichomonas, herpes, HPV, and human immunodeficiency virus (HIV). Herpes, HIV, and HPV are viral illnesses that have no cure. They can result in disability, cancer, and death. Sexually active women aged 50 years and younger should be checked for chlamydia. Older women with new or multiple partners should also be tested for chlamydia. Testing for other STIs is recommended if you are sexually active and at increased risk.   Osteoporosis is a disease in which the bones lose minerals and strength with aging. This can result in serious bone fractures or breaks. The risk of osteoporosis can be identified using a bone density scan. Women ages 67 years and over and women at risk for fractures or osteoporosis should discuss screening with their health care providers. Ask your health care provider whether you should take a calcium supplement or vitamin D to reduce the rate of osteoporosis.   Menopause can be associated with physical symptoms and risks. Hormone replacement therapy is available to decrease symptoms and risks. You should talk to your health care provider about whether hormone replacement therapy is right for you.   Use sunscreen. Apply sunscreen liberally and repeatedly throughout the day. You should seek shade when your shadow is shorter than you. Protect yourself by wearing long sleeves, pants, a wide-brimmed hat, and sunglasses year round, whenever you are outdoors.   Once a month, do a whole body skin exam, using a mirror to look at the skin on your back. Tell your health care provider of new moles, moles that have irregular borders, moles that are larger than a pencil eraser, or moles that have changed in shape or color.   Stay current with required vaccines (immunizations).   Influenza vaccine. All adults should be immunized every year.   Tetanus, diphtheria, and acellular pertussis (Td, Tdap) vaccine. Pregnant women  should receive 1 dose of Tdap vaccine during each pregnancy. The dose should be obtained regardless of the length of time since the last dose. Immunization is preferred during the 27th 36th week of gestation. An adult who has not previously received Tdap or who does not know her vaccine status should receive 1 dose of Tdap. This initial dose should be followed by tetanus and diphtheria toxoids (Td) booster doses every 10 years. Adults with an unknown or incomplete history of completing a 3-dose immunization series with Td-containing vaccines should begin or complete a primary immunization series including a Tdap dose. Adults should receive a Td booster every 10 years.   Varicella vaccine. An adult without evidence of immunity to varicella should receive 2 doses or a second dose if she has previously received 1 dose. Pregnant females who do not have evidence of immunity should receive the first dose after pregnancy. This first dose should be obtained before leaving the health care facility. The second dose should be obtained 4 8 weeks  after the first dose.   Human papillomavirus (HPV) vaccine. Females aged 27 26 years who have not received the vaccine previously should obtain the 3-dose series. The vaccine is not recommended for use in pregnant females. However, pregnancy testing is not needed before receiving a dose. If a female is found to be pregnant after receiving a dose, no treatment is needed. In that case, the remaining doses should be delayed until after the pregnancy. Immunization is recommended for any person with an immunocompromised condition through the age of 54 years if she did not get any or all doses earlier. During the 3-dose series, the second dose should be obtained 4 8 weeks after the first dose. The third dose should be obtained 24 weeks after the first dose and 16 weeks after the second dose.   Zoster vaccine. One dose is recommended for adults aged 48 years or older unless certain  conditions are present.   Measles, mumps, and rubella (MMR) vaccine. Adults born before [birth year] generally are considered immune to measles and mumps. Adults born in [birth year] or later should have 1 or more doses of MMR vaccine unless there is a contraindication to the vaccine or there is laboratory evidence of immunity to each of the three diseases. A routine second dose of MMR vaccine should be obtained at least 28 days after the first dose for students attending postsecondary schools, health care workers, or international travelers. People who received inactivated measles vaccine or an unknown type of measles vaccine during 1963 1967 should receive 2 doses of MMR vaccine. People who received inactivated mumps vaccine or an unknown type of mumps vaccine before 1979 and are at high risk for mumps infection should consider immunization with 2 doses of MMR vaccine. For females of childbearing age, rubella immunity should be determined. If there is no evidence of immunity, females who are not pregnant should be vaccinated. If there is no evidence of immunity, females who are pregnant should delay immunization until after pregnancy. Unvaccinated health care workers born before [birth year] who lack laboratory evidence of measles, mumps, or rubella immunity or laboratory confirmation of disease should consider measles and mumps immunization with 2 doses of MMR vaccine or rubella immunization with 1 dose of MMR vaccine.   Pneumococcal 13-valent conjugate (PCV13) vaccine. When indicated, a person who is uncertain of her immunization history and has no record of immunization should receive the PCV13 vaccine. An adult aged 12 years or older who has certain medical conditions and has not been previously immunized should receive 1 dose of PCV13 vaccine. This PCV13 should be followed with a dose of pneumococcal polysaccharide (PPSV23) vaccine. The PPSV23 vaccine dose should be obtained at least 8 weeks after the dose of PCV13 vaccine. An  adult aged 11 years or older who has certain medical conditions and previously received 1 or more doses of PPSV23 vaccine should receive 1 dose of PCV13. The PCV13 vaccine dose should be obtained 1 or more years after the last PPSV23 vaccine dose.   Pneumococcal polysaccharide (PPSV23) vaccine. When PCV13 is also indicated, PCV13 should be obtained first. All adults aged 95 years and older should be immunized. An adult younger than age 35 years who has certain medical conditions should be immunized. Any person who resides in a nursing home or long-term care facility should be immunized. An adult smoker should be immunized. People with an immunocompromised condition and certain other conditions should receive both PCV13 and PPSV23 vaccines. People with human immunodeficiency virus (HIV) infection should  be immunized as soon as possible after diagnosis. Immunization during chemotherapy or radiation therapy should be avoided. Routine use of PPSV23 vaccine is not recommended for American Indians, [place], or people younger than 65 years unless there are medical conditions that require PPSV23 vaccine. When indicated, people who have unknown immunization and have no record of immunization should receive PPSV23 vaccine. One-time revaccination 5 years after the first dose of PPSV23 is recommended for people aged 1 64 years who have chronic kidney failure, nephrotic syndrome, asplenia, or immunocompromised conditions. People who received 1 2 doses of PPSV23 before age 38 years should receive another dose of PPSV23 vaccine at age 57 years or later if at least 5 years have passed since the previous dose. Doses of PPSV23 are not needed for people immunized with PPSV23 at or after age 66 years.   Meningococcal vaccine. Adults with asplenia or persistent complement component deficiencies should receive 2 doses of quadrivalent meningococcal conjugate (MenACWY-D) vaccine. The doses should be obtained at least 2 months  apart. Microbiologists working with certain meningococcal bacteria, [place] recruits, people at risk during an outbreak, and people who travel to or live in countries with a high rate of meningitis should be immunized. A first-year college student up through age 41 years who is living in a residence hall should receive a dose if she did not receive a dose on or after her 16th birthday. Adults who have certain high-risk conditions should receive one or more doses of vaccine.   Hepatitis A vaccine. Adults who wish to be protected from this disease, have certain high-risk conditions, work with hepatitis A-infected animals, work in hepatitis A research labs, or travel to or work in countries with a high rate of hepatitis A should be immunized. Adults who were previously unvaccinated and who anticipate close contact with an international adoptee during the first 60 days after arrival in the [place] from a country with a high rate of hepatitis A should be immunized.   Hepatitis B vaccine. Adults who wish to be protected from this disease, have certain high-risk conditions, may be exposed to blood or other infectious body fluids, are household contacts or sex partners of hepatitis B positive people, are clients or workers in certain care facilities, or travel to or work in countries with a high rate of hepatitis B should be immunized.   Haemophilus influenzae type b (Hib) vaccine. A previously unvaccinated person with asplenia or sickle cell disease or having a scheduled splenectomy should receive 1 dose of Hib vaccine. Regardless of previous immunization, a recipient of a hematopoietic stem cell transplant should receive a 3-dose series 6 12 months after her successful transplant. Hib vaccine is not recommended for adults with HIV infection.  Preventive Services / Frequency Ages 30 to 39years  Blood pressure check.** / Every 1 to 2 years.  Lipid and cholesterol check.** / Every 5 years beginning at  age 20.  Clinical breast exam.** / Every 3 years for women in their 30s and 22s.  BRCA-related cancer risk assessment.** / For women who have family members with a BRCA-related cancer (breast, ovarian, tubal, or peritoneal cancers).  Pap test.** / Every 2 years from ages 48 through 55. Every 3 years starting at age 38 through age 18 or 1 with a history of 3 consecutive normal Pap tests.  HPV screening.** / Every 3 years from ages 2 through ages 25 to 62 with a history of 3 consecutive normal Pap tests.  Hepatitis C blood test.** /  For any individual with known risks for hepatitis C.  Skin self-exam. / Monthly.  Influenza vaccine. / Every year.  Tetanus, diphtheria, and acellular pertussis (Tdap, Td) vaccine.** / Consult your health care provider. Pregnant women should receive 1 dose of Tdap vaccine during each pregnancy. 1 dose of Td every 10 years.  Varicella vaccine.** / Consult your health care provider. Pregnant females who do not have evidence of immunity should receive the first dose after pregnancy.  HPV vaccine. / 3 doses over 6 months, if 47 and younger. The vaccine is not recommended for use in pregnant females. However, pregnancy testing is not needed before receiving a dose.  Measles, mumps, rubella (MMR) vaccine.** / You need at least 1 dose of MMR if you were born in 1957 or later. You may also need a 2nd dose. For females of childbearing age, rubella immunity should be determined. If there is no evidence of immunity, females who are not pregnant should be vaccinated. If there is no evidence of immunity, females who are pregnant should delay immunization until after pregnancy.  Pneumococcal 13-valent conjugate (PCV13) vaccine.** / Consult your health care provider.  Pneumococcal polysaccharide (PPSV23) vaccine.** / 1 to 2 doses if you smoke cigarettes or if you have certain conditions.  Meningococcal vaccine.** / 1 dose if you are age 64 to 57 years and a Occupational psychologist living in a residence hall, or have one of several medical conditions, you need to get vaccinated against meningococcal disease. You may also need additional booster doses.  Hepatitis A vaccine.** / Consult your health care provider.  Hepatitis B vaccine.** / Consult your health care provider.  Haemophilus influenzae type b (Hib) vaccine.** / Consult your health care provider.  Ages 43 to 64years  Blood pressure check.** / Every 1 to 2 years.  Lipid and cholesterol check.** / Every 5 years beginning at age 58 years.  Lung cancer screening. / Every year if you are aged 75 80 years and have a 30-pack-year history of smoking and currently smoke or have quit within the past 15 years. Yearly screening is stopped once you have quit smoking for at least 15 years or develop a health problem that would prevent you from having lung cancer treatment.  Clinical breast exam.** / Every year after age 89 years.  BRCA-related cancer risk assessment.** / For women who have family members with a BRCA-related cancer (breast, ovarian, tubal, or peritoneal cancers).  Mammogram.** / Every year beginning at age 52 years and continuing for as long as you are in good health. Consult with your health care provider.  Pap test.** / Every 3 years starting at age 28 years through age 71 or 76 years with a history of 3 consecutive normal Pap tests.  HPV screening.** / Every 3 years from ages 83 years through ages 9 to 73 years with a history of 3 consecutive normal Pap tests.  Fecal occult blood test (FOBT) of stool. / Every year beginning at age 43 years and continuing until age 22 years. You may not need to do this test if you get a colonoscopy every 10 years.  Flexible sigmoidoscopy or colonoscopy.** / Every 5 years for a flexible sigmoidoscopy or every 10 years for a colonoscopy beginning at age 6 years and continuing until age 38 years.  Hepatitis C blood test.** / For all people born from  [birth year] through 1965 and any individual with known risks for hepatitis C.  Skin self-exam. / Monthly.  Influenza vaccine. /  Every year.  Tetanus, diphtheria, and acellular pertussis (Tdap/Td) vaccine.** / Consult your health care provider. Pregnant women should receive 1 dose of Tdap vaccine during each pregnancy. 1 dose of Td every 10 years.  Varicella vaccine.** / Consult your health care provider. Pregnant females who do not have evidence of immunity should receive the first dose after pregnancy.  Zoster vaccine.** / 1 dose for adults aged 32 years or older.  Measles, mumps, rubella (MMR) vaccine.** / You need at least 1 dose of MMR if you were born in 1957 or later. You may also need a 2nd dose. For females of childbearing age, rubella immunity should be determined. If there is no evidence of immunity, females who are not pregnant should be vaccinated. If there is no evidence of immunity, females who are pregnant should delay immunization until after pregnancy.  Pneumococcal 13-valent conjugate (PCV13) vaccine.** / Consult your health care provider.  Pneumococcal polysaccharide (PPSV23) vaccine.** / 1 to 2 doses if you smoke cigarettes or if you have certain conditions.  Meningococcal vaccine.** / Consult your health care provider.  Hepatitis A vaccine.** / Consult your health care provider.  Hepatitis B vaccine.** / Consult your health care provider.  Haemophilus influenzae type b (Hib) vaccine.** / Consult your health care provider.  Ages 67 years and over  Blood pressure check.** / Every 1 to 2 years.  Lipid and cholesterol check.** / Every 5 years beginning at age 105 years.  Lung cancer screening. / Every year if you are aged 75 80 years and have a 30-pack-year history of smoking and currently smoke or have quit within the past 15 years. Yearly screening is stopped once you have quit smoking for at least 15 years or develop a health problem that would prevent you from having lung  cancer treatment.  Clinical breast exam.** / Every year after age 89 years.  BRCA-related cancer risk assessment.** / For women who have family members with a BRCA-related cancer (breast, ovarian, tubal, or peritoneal cancers).  Mammogram.** / Every year beginning at age 66 years and continuing for as long as you are in good health. Consult with your health care provider.  Pap test.** / Every 3 years starting at age 68 years through age 29 or 19 years with 3 consecutive normal Pap tests. Testing can be stopped between 65 and 70 years with 3 consecutive normal Pap tests and no abnormal Pap or HPV tests in the past 10 years.  HPV screening.** / Every 3 years from ages 65 years through ages 55 or 55 years with a history of 3 consecutive normal Pap tests. Testing can be stopped between 65 and 70 years with 3 consecutive normal Pap tests and no abnormal Pap or HPV tests in the past 10 years.  Fecal occult blood test (FOBT) of stool. / Every year beginning at age 25 years and continuing until age 60 years. You may not need to do this test if you get a colonoscopy every 10 years.  Flexible sigmoidoscopy or colonoscopy.** / Every 5 years for a flexible sigmoidoscopy or every 10 years for a colonoscopy beginning at age 6 years and continuing until age 31 years.  Hepatitis C blood test.** / For all people born from [birth year] through 1965 and any individual with known risks for hepatitis C.  Osteoporosis screening.** / A one-time screening for women ages 3 years and over and women at risk for fractures or osteoporosis.  Skin self-exam. / Monthly.  Influenza vaccine. / Every year.  Tetanus, diphtheria, and acellular pertussis (Tdap/Td) vaccine.** / 1 dose of Td every 10 years.  Varicella vaccine.** / Consult your health care provider.  Zoster vaccine.** / 1 dose for adults aged 59 years or older.  Pneumococcal 13-valent conjugate (PCV13) vaccine.** / Consult your health care provider.  Pneumococcal  polysaccharide (PPSV23) vaccine.** / 1 dose for all adults aged 18 years and older.  Meningococcal vaccine.** / Consult your health care provider.  Hepatitis A vaccine.** / Consult your health care provider.  Hepatitis B vaccine.** / Consult your health care provider.  Haemophilus influenzae type b (Hib) vaccine.** / Consult your health care provider.     ** Family history and personal history of risk and conditions may change your health care provider's recommendations. Document Released: 05/31/2001 Document Revised: 01/23/2013  [facility] Patient Information 2014 [place], [place].   EXERCISE AND DIET:  We recommended that you start or continue a regular exercise program for good health. Regular exercise means any activity that makes your heart beat faster and makes you sweat.  We recommend exercising at least 30 minutes per day at least 3 days a week, preferably 5.  We also recommend a diet low in fat and sugar / carbohydrates.  Inactivity, poor dietary choices and obesity can cause diabetes, heart attack, stroke, and kidney damage, among others.     ALCOHOL AND SMOKING:  Women should limit their alcohol intake to no more than 7 drinks/beers/glasses of wine (combined, not each!) per week. Moderation of alcohol intake to this level decreases your risk of breast cancer and liver damage.  ( And of course, no recreational drugs are part of a healthy lifestyle.)  Also, you should not be smoking at all or even being exposed to second hand smoke. Most people know smoking can cause cancer, and various heart and lung diseases, but did you know it also contributes to weakening of your bones?  Aging of your skin?  Yellowing of your teeth and nails?   CALCIUM AND VITAMIN D:  Adequate intake of calcium and Vitamin D are recommended.  The recommendations for exact amounts of these supplements seem to change often, but generally speaking 600 mg of calcium (either carbonate or citrate) and 800 units of  Vitamin D per day seems prudent. Certain women may benefit from higher intake of Vitamin D.  If you are among these women, your doctor will have told you during your visit.     PAP SMEARS:  Pap smears, to check for cervical cancer or precancers,  have traditionally been done yearly, although recent scientific advances have shown that most women can have pap smears less often.  However, every woman still should have a physical exam from her gynecologist or primary care physician every year. It will include a breast check, inspection of the vulva and vagina to check for abnormal growths or skin changes, a visual exam of the cervix, and then an exam to evaluate the size and shape of the uterus and ovaries.  And after 70 years of age, a rectal exam is indicated to check for rectal cancers. We will also provide age appropriate advice regarding health maintenance, like when you should have certain vaccines, screening for sexually transmitted diseases, bone density testing, colonoscopy, mammograms, etc.    MAMMOGRAMS:  All women over 39 years old should have a yearly mammogram. Many facilities now offer a "3D" mammogram, which may cost around $50 extra out of pocket. If possible,  we recommend you accept the option to have the  3D mammogram performed.  It both reduces the number of women who will be called back for extra views which then turn out to be normal, and it is better than the routine mammogram at detecting truly abnormal areas.     COLONOSCOPY:  Colonoscopy to screen for colon cancer is recommended for all women at age 75.  We know, you hate the idea of the prep.  We agree, BUT, having colon cancer and not knowing it is worse!!  Colon cancer so often starts as a polyp that can be seen and removed at colonscopy, which can quite literally save your life!  And if your first colonoscopy is normal and you have no family history of colon cancer, most women don't have to have it again for 10 years.  Once  every ten years, you can do something that may end up saving your life, right?  We will be happy to help you get it scheduled when you are ready.  Be sure to check your insurance coverage so you understand how much it will cost.  It may be covered as a preventative service at no cost, but you should check your particular policy.

## 2016-02-22 NOTE — Assessment & Plan Note (Signed)
Unknown family history

## 2016-02-24 LAB — HM DEXA SCAN

## 2016-03-24 ENCOUNTER — Ambulatory Visit (INDEPENDENT_AMBULATORY_CARE_PROVIDER_SITE_OTHER): Payer: Medicare Other | Admitting: Family Medicine

## 2016-03-24 ENCOUNTER — Encounter: Payer: Self-pay | Admitting: Family Medicine

## 2016-03-24 VITALS — BP 121/79 | HR 84 | Resp 17 | Ht 63.25 in | Wt 129.0 lb

## 2016-03-24 DIAGNOSIS — Z7189 Other specified counseling: Secondary | ICD-10-CM | POA: Diagnosis not present

## 2016-03-24 DIAGNOSIS — M810 Age-related osteoporosis without current pathological fracture: Secondary | ICD-10-CM

## 2016-03-24 DIAGNOSIS — E559 Vitamin D deficiency, unspecified: Secondary | ICD-10-CM

## 2016-03-24 DIAGNOSIS — M8589 Other specified disorders of bone density and structure, multiple sites: Secondary | ICD-10-CM

## 2016-03-24 DIAGNOSIS — Z23 Encounter for immunization: Secondary | ICD-10-CM | POA: Diagnosis not present

## 2016-03-24 DIAGNOSIS — Z0000 Encounter for general adult medical examination without abnormal findings: Secondary | ICD-10-CM

## 2016-03-24 DIAGNOSIS — N959 Unspecified menopausal and perimenopausal disorder: Secondary | ICD-10-CM

## 2016-03-24 DIAGNOSIS — M1990 Unspecified osteoarthritis, unspecified site: Secondary | ICD-10-CM

## 2016-03-24 NOTE — Progress Notes (Signed)
    Review of Systems    General Appearance:    Alert, cooperative, no distress, appears stated age  Head:    Normocephalic, without obvious abnormality, atraumatic  Eyes:    PERRL, conjunctiva/corneas clear, EOM's intact, fundi    benign, both eyes  Ears:    Normal TM's and external ear canals, both ears  Nose:   Nares normal, septum midline, mucosa normal, no drainage    or sinus tenderness  Throat:   Lips w/o lesion, mucosa moist, and tongue normal; teeth and   gums normal  Neck:   Supple, symmetrical, trachea midline, no adenopathy;    thyroid:  no enlargement/tenderness/nodules; no carotid   bruit or JVD  Back:     Symmetric, no curvature, ROM normal, no CVA tenderness  Lungs:     Clear to auscultation bilaterally, respirations unlabored, no       Wh/ R/ R  Chest Wall:    No tenderness or gross deformity; normal excursion   Heart:    Regular rate and rhythm, S1 and S2 normal, no murmur, rub   or gallop  Breast Exam:    No tenderness, masses, or nipple abnormality b/l; no d/c  Abdomen:     Soft, non-tender, bowel sounds active all four quadrants, NO   G/R/R, no masses, no organomegaly  Genitalia:    Ext genitalia: without lesion, no rash or discharge, No         tenderness;  Cervix: WNL's w/o discharge or lesion;        Adnexa:  No tenderness or palpable masses   Rectal:    Normal tone, no masses or tenderness;   guaiac negative stool  Extremities:   Extremities normal, atraumatic, no cyanosis or gross edema  Pulses:   2+ and symmetric all extremities  Skin:   Warm, dry, Skin color, texture, turgor normal, no obvious rashes or lesions Psych: No HI/SI, judgement and insight good, Euthymic mood. Full Affect.  Neurologic:   CNII-XII intact, normal strength, sensation and reflexes    Throughout

## 2016-03-24 NOTE — Patient Instructions (Addendum)
Preventive Care for Adults, Female  A healthy lifestyle and preventive care can promote health and wellness. Preventive health guidelines for women include the following key practices.   A routine yearly physical is a good way to check with your health care provider about your health and preventive screening. It is a chance to share any concerns and updates on your health and to receive a thorough exam.   Visit your dentist for a routine exam and preventive care every 6 months. Brush your teeth twice a day and floss once a day. Good oral hygiene prevents tooth decay and gum disease.   The frequency of eye exams is based on your age, health, family medical history, use of contact lenses, and other factors. Follow your health care provider's recommendations for frequency of eye exams.   Eat a healthy diet. Foods like vegetables, fruits, whole grains, low-fat dairy products, and lean protein foods contain the nutrients you need without too many calories. Decrease your intake of foods high in solid fats, added sugars, and salt. Eat the right amount of calories for you.Get information about a proper diet from your health care provider, if necessary.   Regular physical exercise is one of the most important things you can do for your health. Most adults should get at least 150 minutes of moderate-intensity exercise (any activity that increases your heart rate and causes you to sweat) each week. In addition, most adults need muscle-strengthening exercises on 2 or more days a week.   Maintain a healthy weight. The body mass index (BMI) is a screening tool to identify possible weight problems. It provides an estimate of body fat based on height and weight. Your health care provider can find your BMI, and can help you achieve or maintain a healthy weight.For adults 20 years and older:   - A BMI below 18.5 is considered underweight.   - A BMI of 18.5 to 24.9 is normal.   - A BMI of 25 to 29.9 is  considered overweight.   - A BMI of 30 and above is considered obese.   Maintain normal blood lipids and cholesterol levels by exercising and minimizing your intake of trans and saturated fats.  Eat a balanced diet with plenty of fruit and vegetables. Blood tests for lipids and cholesterol should begin at age 20 and be repeated every 5 years minimum.  If your lipid or cholesterol levels are high, you are over 40, or you are at high risk for heart disease, you may need your cholesterol levels checked more frequently.Ongoing high lipid and cholesterol levels should be treated with medicines if diet and exercise are not working.   If you smoke, find out from your health care provider how to quit. If you do not use tobacco, do not start.   Lung cancer screening is recommended for adults aged 55-80 years who are at high risk for developing lung cancer because of a history of smoking. A yearly low-dose CT scan of the lungs is recommended for people who have at least a 30-pack-year history of smoking and are a current smoker or have quit within the past 15 years. A pack year of smoking is smoking an average of 1 pack of cigarettes a day for 1 year (for example: 1 pack a day for 30 years or 2 packs a day for 15 years). Yearly screening should continue until the smoker has stopped smoking for at least 15 years. Yearly screening should be stopped for people who develop a   health problem that would prevent them from having lung cancer treatment.   If you are pregnant, do not drink alcohol. If you are breastfeeding, be very cautious about drinking alcohol. If you are not pregnant and choose to drink alcohol, do not have more than 1 drink per day. One drink is considered to be 12 ounces (355 mL) of beer, 5 ounces (148 mL) of wine, or 1.5 ounces (44 mL) of liquor.   Avoid use of street drugs. Do not share needles with anyone. Ask for help if you need support or instructions about stopping the use of  drugs.   High blood pressure causes heart disease and increases the risk of stroke. Your blood pressure should be checked at least yearly.  Ongoing high blood pressure should be treated with medicines if weight loss and exercise do not work.   If you are 69-55 years old, ask your health care provider if you should take aspirin to prevent strokes.   Diabetes screening involves taking a blood sample to check your fasting blood sugar level. This should be done once every 3 years, after age 38, if you are within normal weight and without risk factors for diabetes. Testing should be considered at a younger age or be carried out more frequently if you are overweight and have at least 1 risk factor for diabetes.   Breast cancer screening is essential preventive care for women. You should practice "breast self-awareness."  This means understanding the normal appearance and feel of your breasts and may include breast self-examination.  Any changes detected, no matter how small, should be reported to a health care provider.  Women in their 80s and 30s should have a clinical breast exam (CBE) by a health care provider as part of a regular health exam every 1 to 3 years.  After age 66, women should have a CBE every year.  Starting at age 1, women should consider having a mammogram (breast X-ray test) every year.  Women who have a family history of breast cancer should talk to their health care provider about genetic screening.  Women at a high risk of breast cancer should talk to their health care providers about having an MRI and a mammogram every year.   -Breast cancer gene (BRCA)-related cancer risk assessment is recommended for women who have family members with BRCA-related cancers. BRCA-related cancers include breast, ovarian, tubal, and peritoneal cancers. Having family members with these cancers may be associated with an increased risk for harmful changes (mutations) in the breast cancer genes BRCA1 and  BRCA2. Results of the assessment will determine the need for genetic counseling and BRCA1 and BRCA2 testing.   The Pap test is a screening test for cervical cancer. A Pap test can show cell changes on the cervix that might become cervical cancer if left untreated. A Pap test is a procedure in which cells are obtained and examined from the lower end of the uterus (cervix).   - Women should have a Pap test starting at age 57.   - Between ages 90 and 70, Pap tests should be repeated every 2 years.   - Beginning at age 63, you should have a Pap test every 3 years as long as the past 3 Pap tests have been normal.   - Some women have medical problems that increase the chance of getting cervical cancer. Talk to your health care provider about these problems. It is especially important to talk to your health care provider if a  new problem develops soon after your last Pap test. In these cases, your health care provider may recommend more frequent screening and Pap tests.   - The above recommendations are the same for women who have or have not gotten the vaccine for human papillomavirus (HPV).   - If you had a hysterectomy for a problem that was not cancer or a condition that could lead to cancer, then you no longer need Pap tests. Even if you no longer need a Pap test, a regular exam is a good idea to make sure no other problems are starting.   - If you are between ages 36 and 66 years, and you have had normal Pap tests going back 10 years, you no longer need Pap tests. Even if you no longer need a Pap test, a regular exam is a good idea to make sure no other problems are starting.   - If you have had past treatment for cervical cancer or a condition that could lead to cancer, you need Pap tests and screening for cancer for at least 20 years after your treatment.   - If Pap tests have been discontinued, risk factors (such as a new sexual partner) need to be reassessed to determine if screening should  be resumed.   - The HPV test is an additional test that may be used for cervical cancer screening. The HPV test looks for the virus that can cause the cell changes on the cervix. The cells collected during the Pap test can be tested for HPV. The HPV test could be used to screen women aged 70 years and older, and should be used in women of any age who have unclear Pap test results. After the age of 67, women should have HPV testing at the same frequency as a Pap test.   Colorectal cancer can be detected and often prevented. Most routine colorectal cancer screening begins at the age of 57 years and continues through age 26 years. However, your health care provider may recommend screening at an earlier age if you have risk factors for colon cancer. On a yearly basis, your health care provider may provide home test kits to check for hidden blood in the stool.  Use of a small camera at the end of a tube, to directly examine the colon (sigmoidoscopy or colonoscopy), can detect the earliest forms of colorectal cancer. Talk to your health care provider about this at age 23, when routine screening begins. Direct exam of the colon should be repeated every 5 -10 years through age 49 years, unless early forms of pre-cancerous polyps or small growths are found.   People who are at an increased risk for hepatitis B should be screened for this virus. You are considered at high risk for hepatitis B if:  -You were born in a country where hepatitis B occurs often. Talk with your health care provider about which countries are considered high risk.  - Your parents were born in a high-risk country and you have not received a shot to protect against hepatitis B (hepatitis B vaccine).  - You have HIV or AIDS.  - You use needles to inject street drugs.  - You live with, or have sex with, someone who has Hepatitis B.  - You get hemodialysis treatment.  - You take certain medicines for conditions like cancer, organ  transplantation, and autoimmune conditions.   Hepatitis C blood testing is recommended for all people born from [birth year] through 1965 and any individual  with known risks for hepatitis C.   Practice safe sex. Use condoms and avoid high-risk sexual practices to reduce the spread of sexually transmitted infections (STIs). STIs include gonorrhea, chlamydia, syphilis, trichomonas, herpes, HPV, and human immunodeficiency virus (HIV). Herpes, HIV, and HPV are viral illnesses that have no cure. They can result in disability, cancer, and death. Sexually active women aged 25 years and younger should be checked for chlamydia. Older women with new or multiple partners should also be tested for chlamydia. Testing for other STIs is recommended if you are sexually active and at increased risk.   Osteoporosis is a disease in which the bones lose minerals and strength with aging. This can result in serious bone fractures or breaks. The risk of osteoporosis can be identified using a bone density scan. Women ages 65 years and over and women at risk for fractures or osteoporosis should discuss screening with their health care providers. Ask your health care provider whether you should take a calcium supplement or vitamin D to There are also several preventive steps women can take to avoid osteoporosis and resulting fractures or to keep osteoporosis from worsening. -->Recommendations include:  Eat a balanced diet high in fruits, vegetables, calcium, and vitamins.  Get enough calcium. The recommended total intake of is 1,200 mg daily; for best absorption, if taking supplements, divide doses into 250-500 mg doses throughout the day. Of the two types of calcium, calcium carbonate is best absorbed when taken with food but calcium citrate can be taken on an empty stomach.  Get enough vitamin D. NAMS and the National Osteoporosis Foundation recommend at least 1,000 IU per day for women age 50 and over who are at risk of vitamin D  deficiency. Vitamin D deficiency can be caused by inadequate sun exposure (for example, those who live in northern latitudes).  Avoid alcohol and smoking. Heavy alcohol intake (more than 7 drinks per week) increases the risk of falls and hip fracture and women smokers tend to lose bone more rapidly and have lower bone mass than nonsmokers. Stopping smoking is one of the most important changes women can make to improve their health and decrease risk for disease.  Be physically active every day. Weight-bearing exercise (for example, fast walking, hiking, jogging, and weight training) may strengthen bones or slow the rate of bone loss that comes with aging. Balancing and muscle-strengthening exercises can reduce the risk of falling and fracture.  Consider therapeutic medications. Currently, several types of effective drugs are available. Healthcare providers can recommend the type most appropriate for each woman.  Eliminate environmental factors that may contribute to accidents. Falls cause nearly 90% of all osteoporotic fractures, so reducing this risk is an important bone-health strategy. Measures include ample lighting, removing obstructions to walking, using nonskid rugs on floors, and placing mats and/or grab bars in showers.  Be aware of medication side effects. Some common medicines make bones weaker. These include a type of steroid drug called glucocorticoids used for arthritis and asthma, some antiseizure drugs, certain sleeping pills, treatments for endometriosis, and some cancer drugs. An overactive thyroid gland or using too much thyroid hormone for an underactive thyroid can also be a problem. If you are taking these medicines, talk to your doctor about what you can do to help protect your bones.reduce the rate of osteoporosis.    Menopause can be associated with physical symptoms and risks. Hormone replacement therapy is available to decrease symptoms and risks. You should talk to your  health care provider   about whether hormone replacement therapy is right for you.   Use sunscreen. Apply sunscreen liberally and repeatedly throughout the day. You should seek shade when your shadow is shorter than you. Protect yourself by wearing long sleeves, pants, a wide-brimmed hat, and sunglasses year round, whenever you are outdoors.   Once a month, do a whole body skin exam, using a mirror to look at the skin on your back. Tell your health care provider of new moles, moles that have irregular borders, moles that are larger than a pencil eraser, or moles that have changed in shape or color.   -Stay current with required vaccines (immunizations).   Influenza vaccine. All adults should be immunized every year.  Tetanus, diphtheria, and acellular pertussis (Td, Tdap) vaccine. Pregnant women should receive 1 dose of Tdap vaccine during each pregnancy. The dose should be obtained regardless of the length of time since the last dose. Immunization is preferred during the 27th 36th week of gestation. An adult who has not previously received Tdap or who does not know her vaccine status should receive 1 dose of Tdap. This initial dose should be followed by tetanus and diphtheria toxoids (Td) booster doses every 10 years. Adults with an unknown or incomplete history of completing a 3-dose immunization series with Td-containing vaccines should begin or complete a primary immunization series including a Tdap dose. Adults should receive a Td booster every 10 years.  Varicella vaccine. An adult without evidence of immunity to varicella should receive 2 doses or a second dose if she has previously received 1 dose. Pregnant females who do not have evidence of immunity should receive the first dose after pregnancy. This first dose should be obtained before leaving the health care facility. The second dose should be obtained 4 8 weeks after the first dose.  Human papillomavirus (HPV) vaccine. Females aged 13 26  years who have not received the vaccine previously should obtain the 3-dose series. The vaccine is not recommended for use in pregnant females. However, pregnancy testing is not needed before receiving a dose. If a female is found to be pregnant after receiving a dose, no treatment is needed. In that case, the remaining doses should be delayed until after the pregnancy. Immunization is recommended for any person with an immunocompromised condition through the age of 26 years if she did not get any or all doses earlier. During the 3-dose series, the second dose should be obtained 4 8 weeks after the first dose. The third dose should be obtained 24 weeks after the first dose and 16 weeks after the second dose.  Zoster vaccine. One dose is recommended for adults aged 60 years or older unless certain conditions are present.  Measles, mumps, and rubella (MMR) vaccine. Adults born before 1957 generally are considered immune to measles and mumps. Adults born in 1957 or later should have 1 or more doses of MMR vaccine unless there is a contraindication to the vaccine or there is laboratory evidence of immunity to each of the three diseases. A routine second dose of MMR vaccine should be obtained at least 28 days after the first dose for students attending postsecondary schools, health care workers, or international travelers. People who received inactivated measles vaccine or an unknown type of measles vaccine during 1963 1967 should receive 2 doses of MMR vaccine. People who received inactivated mumps vaccine or an unknown type of mumps vaccine before 1979 and are at high risk for mumps infection should consider immunization with 2 doses of   MMR vaccine. For females of childbearing age, rubella immunity should be determined. If there is no evidence of immunity, females who are not pregnant should be vaccinated. If there is no evidence of immunity, females who are pregnant should delay immunization until after pregnancy.  Unvaccinated health care workers born before [birth year] who lack laboratory evidence of measles, mumps, or rubella immunity or laboratory confirmation of disease should consider measles and mumps immunization with 2 doses of MMR vaccine or rubella immunization with 1 dose of MMR vaccine.  Pneumococcal 13-valent conjugate (PCV13) vaccine. When indicated, a person who is uncertain of her immunization history and has no record of immunization should receive the PCV13 vaccine. An adult aged 54 years or older who has certain medical conditions and has not been previously immunized should receive 1 dose of PCV13 vaccine. This PCV13 should be followed with a dose of pneumococcal polysaccharide (PPSV23) vaccine. The PPSV23 vaccine dose should be obtained at least 8 weeks after the dose of PCV13 vaccine. An adult aged 58 years or older who has certain medical conditions and previously received 1 or more doses of PPSV23 vaccine should receive 1 dose of PCV13. The PCV13 vaccine dose should be obtained 1 or more years after the last PPSV23 vaccine dose.  Pneumococcal polysaccharide (PPSV23) vaccine. When PCV13 is also indicated, PCV13 should be obtained first. All adults aged 58 years and older should be immunized. An adult younger than age 65 years who has certain medical conditions should be immunized. Any person who resides in a nursing home or long-term care facility should be immunized. An adult smoker should be immunized. People with an immunocompromised condition and certain other conditions should receive both PCV13 and PPSV23 vaccines. People with human immunodeficiency virus (HIV) infection should be immunized as soon as possible after diagnosis. Immunization during chemotherapy or radiation therapy should be avoided. Routine use of PPSV23 vaccine is not recommended for American Indians, [place] Natives, or people younger than 65 years unless there are medical conditions that require PPSV23 vaccine. When indicated,  people who have unknown immunization and have no record of immunization should receive PPSV23 vaccine. One-time revaccination 5 years after the first dose of PPSV23 is recommended for people aged 70 64 years who have chronic kidney failure, nephrotic syndrome, asplenia, or immunocompromised conditions. People who received 1 2 doses of PPSV23 before age 32 years should receive another dose of PPSV23 vaccine at age 96 years or later if at least 5 years have passed since the previous dose. Doses of PPSV23 are not needed for people immunized with PPSV23 at or after age 55 years.  Meningococcal vaccine. Adults with asplenia or persistent complement component deficiencies should receive 2 doses of quadrivalent meningococcal conjugate (MenACWY-D) vaccine. The doses should be obtained at least 2 months apart. Microbiologists working with certain meningococcal bacteria, [employer] recruits, people at risk during an outbreak, and people who travel to or live in countries with a high rate of meningitis should be immunized. A first-year college student up through age 58 years who is living in a residence hall should receive a dose if she did not receive a dose on or after her 16th birthday. Adults who have certain high-risk conditions should receive one or more doses of vaccine.  Hepatitis A vaccine. Adults who wish to be protected from this disease, have certain high-risk conditions, work with hepatitis A-infected animals, work in hepatitis A research labs, or travel to or work in countries with a high rate of hepatitis A should be  immunized. Adults who were previously unvaccinated and who anticipate close contact with an international adoptee during the first 60 days after arrival in the [place] from a country with a high rate of hepatitis A should be immunized.  Hepatitis B vaccine.  Adults who wish to be protected from this disease, have certain high-risk conditions, may be exposed to blood or other infectious  body fluids, are household contacts or sex partners of hepatitis B positive people, are clients or workers in certain care facilities, or travel to or work in countries with a high rate of hepatitis B should be immunized.  Haemophilus influenzae type b (Hib) vaccine. A previously unvaccinated person with asplenia or sickle cell disease or having a scheduled splenectomy should receive 1 dose of Hib vaccine. Regardless of previous immunization, a recipient of a hematopoietic stem cell transplant should receive a 3-dose series 6 12 months after her successful transplant. Hib vaccine is not recommended for adults with HIV infection.  Preventive Services / Frequency Ages 6 to 39years  Blood pressure check.** / Every 1 to 2 years.  Lipid and cholesterol check.** / Every 5 years beginning at age 39.  Clinical breast exam.** / Every 3 years for women in their 61s and 62s.  BRCA-related cancer risk assessment.** / For women who have family members with a BRCA-related cancer (breast, ovarian, tubal, or peritoneal cancers).  Pap test.** / Every 2 years from ages 47 through 85. Every 3 years starting at age 34 through age 12 or 74 with a history of 3 consecutive normal Pap tests.  HPV screening.** / Every 3 years from ages 46 through ages 43 to 54 with a history of 3 consecutive normal Pap tests.  Hepatitis C blood test.** / For any individual with known risks for hepatitis C.  Skin self-exam. / Monthly.  Influenza vaccine. / Every year.  Tetanus, diphtheria, and acellular pertussis (Tdap, Td) vaccine.** / Consult your health care provider. Pregnant women should receive 1 dose of Tdap vaccine during each pregnancy. 1 dose of Td every 10 years.  Varicella vaccine.** / Consult your health care provider. Pregnant females who do not have evidence of immunity should receive the first dose after pregnancy.  HPV vaccine. / 3 doses over 6 months, if 64 and younger. The vaccine is not recommended for use in  pregnant females. However, pregnancy testing is not needed before receiving a dose.  Measles, mumps, rubella (MMR) vaccine.** / You need at least 1 dose of MMR if you were born in 1957 or later. You may also need a 2nd dose. For females of childbearing age, rubella immunity should be determined. If there is no evidence of immunity, females who are not pregnant should be vaccinated. If there is no evidence of immunity, females who are pregnant should delay immunization until after pregnancy.  Pneumococcal 13-valent conjugate (PCV13) vaccine.** / Consult your health care provider.  Pneumococcal polysaccharide (PPSV23) vaccine.** / 1 to 2 doses if you smoke cigarettes or if you have certain conditions.  Meningococcal vaccine.** / 1 dose if you are age 71 to 37 years and a Market researcher living in a residence hall, or have one of several medical conditions, you need to get vaccinated against meningococcal disease. You may also need additional booster doses.  Hepatitis A vaccine.** / Consult your health care provider.  Hepatitis B vaccine.** / Consult your health care provider.  Haemophilus influenzae type b (Hib) vaccine.** / Consult your health care provider.  Ages 55 to 64years  Blood pressure check.** / Every 1 to 2 years.  Lipid and cholesterol check.** / Every 5 years beginning at age 20 years.  Lung cancer screening. / Every year if you are aged 55 80 years and have a 30-pack-year history of smoking and currently smoke or have quit within the past 15 years. Yearly screening is stopped once you have quit smoking for at least 15 years or develop a health problem that would prevent you from having lung cancer treatment.  Clinical breast exam.** / Every year after age 40 years.  BRCA-related cancer risk assessment.** / For women who have family members with a BRCA-related cancer (breast, ovarian, tubal, or peritoneal cancers).  Mammogram.** / Every year beginning at age 40  years and continuing for as long as you are in good health. Consult with your health care provider.  Pap test.** / Every 3 years starting at age 30 years through age 65 or 70 years with a history of 3 consecutive normal Pap tests.  HPV screening.** / Every 3 years from ages 30 years through ages 65 to 70 years with a history of 3 consecutive normal Pap tests.  Fecal occult blood test (FOBT) of stool. / Every year beginning at age 50 years and continuing until age 75 years. You may not need to do this test if you get a colonoscopy every 10 years.  Flexible sigmoidoscopy or colonoscopy.** / Every 5 years for a flexible sigmoidoscopy or every 10 years for a colonoscopy beginning at age 50 years and continuing until age 75 years.  Hepatitis C blood test.** / For all people born from 1945 through 1965 and any individual with known risks for hepatitis C.  Skin self-exam. / Monthly.  Influenza vaccine. / Every year.  Tetanus, diphtheria, and acellular pertussis (Tdap/Td) vaccine.** / Consult your health care provider. Pregnant women should receive 1 dose of Tdap vaccine during each pregnancy. 1 dose of Td every 10 years.  Varicella vaccine.** / Consult your health care provider. Pregnant females who do not have evidence of immunity should receive the first dose after pregnancy.  Zoster vaccine.** / 1 dose for adults aged 60 years or older.  Measles, mumps, rubella (MMR) vaccine.** / You need at least 1 dose of MMR if you were born in 1957 or later. You may also need a 2nd dose. For females of childbearing age, rubella immunity should be determined. If there is no evidence of immunity, females who are not pregnant should be vaccinated. If there is no evidence of immunity, females who are pregnant should delay immunization until after pregnancy.  Pneumococcal 13-valent conjugate (PCV13) vaccine.** / Consult your health care provider.  Pneumococcal polysaccharide (PPSV23) vaccine.** / 1 to 2 doses if  you smoke cigarettes or if you have certain conditions.  Meningococcal vaccine.** / Consult your health care provider.  Hepatitis A vaccine.** / Consult your health care provider.  Hepatitis B vaccine.** / Consult your health care provider.  Haemophilus influenzae type b (Hib) vaccine.** / Consult your health care provider.  Ages 65 years and over  Blood pressure check.** / Every 1 to 2 years.  Lipid and cholesterol check.** / Every 5 years beginning at age 20 years.  Lung cancer screening. / Every year if you are aged 55 80 years and have a 30-pack-year history of smoking and currently smoke or have quit within the past 15 years. Yearly screening is stopped once you have quit smoking for at least 15 years or develop a health problem that   would prevent you from having lung cancer treatment.  Clinical breast exam.** / Every year after age 103 years.  BRCA-related cancer risk assessment.** / For women who have family members with a BRCA-related cancer (breast, ovarian, tubal, or peritoneal cancers).  Mammogram.** / Every year beginning at age 36 years and continuing for as long as you are in good health. Consult with your health care provider.  Pap test.** / Every 3 years starting at age 5 years through age 85 or 10 years with 3 consecutive normal Pap tests. Testing can be stopped between 65 and 70 years with 3 consecutive normal Pap tests and no abnormal Pap or HPV tests in the past 10 years.  HPV screening.** / Every 3 years from ages 93 years through ages 70 or 45 years with a history of 3 consecutive normal Pap tests. Testing can be stopped between 65 and 70 years with 3 consecutive normal Pap tests and no abnormal Pap or HPV tests in the past 10 years.  Fecal occult blood test (FOBT) of stool. / Every year beginning at age 8 years and continuing until age 45 years. You may not need to do this test if you get a colonoscopy every 10 years.  Flexible sigmoidoscopy or colonoscopy.** /  Every 5 years for a flexible sigmoidoscopy or every 10 years for a colonoscopy beginning at age 69 years and continuing until age 68 years.  Hepatitis C blood test.** / For all people born from [birth year] through 1965 and any individual with known risks for hepatitis C.  Osteoporosis screening.** / A one-time screening for women ages 7 years and over and women at risk for fractures or osteoporosis.  Skin self-exam. / Monthly.  Influenza vaccine. / Every year.  Tetanus, diphtheria, and acellular pertussis (Tdap/Td) vaccine.** / 1 dose of Td every 10 years.  Varicella vaccine.** / Consult your health care provider.  Zoster vaccine.** / 1 dose for adults aged 5 years or older.  Pneumococcal 13-valent conjugate (PCV13) vaccine.** / Consult your health care provider.  Pneumococcal polysaccharide (PPSV23) vaccine.** / 1 dose for all adults aged 74 years and older.  Meningococcal vaccine.** / Consult your health care provider.  Hepatitis A vaccine.** / Consult your health care provider.  Hepatitis B vaccine.** / Consult your health care provider.  Haemophilus influenzae type b (Hib) vaccine.** / Consult your health care provider. ** Family history and personal history of risk and conditions may change your health care provider's recommendations. Document Released: 05/31/2001 Document Revised: 01/23/2013  [facility] Patient Information 2014 [place], [place].   EXERCISE AND DIET:  We recommended that you start or continue a regular exercise program for good health. Regular exercise means any activity that makes your heart beat faster and makes you sweat.  We recommend exercising at least 30 minutes per day at least 3 days a week, preferably 5.  We also recommend a diet low in fat and sugar / carbohydrates.  Inactivity, poor dietary choices and obesity can cause diabetes, heart attack, stroke, and kidney damage, among others.     ALCOHOL AND SMOKING:  Women should limit their alcohol intake to no  more than 7 drinks/beers/glasses of wine (combined, not each!) per week. Moderation of alcohol intake to this level decreases your risk of breast cancer and liver damage.  ( And of course, no recreational drugs are part of a healthy lifestyle.)  Also, you should not be smoking at all or even being exposed to second hand smoke. Most people know smoking can  cause cancer, and various heart and lung diseases, but did you know it also contributes to weakening of your bones?  Aging of your skin?  Yellowing of your teeth and nails?   CALCIUM AND VITAMIN D:  Adequate intake of calcium and Vitamin D are recommended.  The recommendations for exact amounts of these supplements seem to change often, but generally speaking 600 mg of calcium (either carbonate or citrate) and 800 units of Vitamin D per day seems prudent. Certain women may benefit from higher intake of Vitamin D.  If you are among these women, your doctor will have told you during your visit.     PAP SMEARS:  Pap smears, to check for cervical cancer or precancers,  have traditionally been done yearly, although recent scientific advances have shown that most women can have pap smears less often.  However, every woman still should have a physical exam from her gynecologist or primary care physician every year. It will include a breast check, inspection of the vulva and vagina to check for abnormal growths or skin changes, a visual exam of the cervix, and then an exam to evaluate the size and shape of the uterus and ovaries.  And after 70 years of age, a rectal exam is indicated to check for rectal cancers. We will also provide age appropriate advice regarding health maintenance, like when you should have certain vaccines, screening for sexually transmitted diseases, bone density testing, colonoscopy, mammograms, etc.    MAMMOGRAMS:  All women over 40 years old should have a yearly mammogram. Many facilities now offer a "3D" mammogram, which may cost  around $50 extra out of pocket. If possible,  we recommend you accept the option to have the 3D mammogram performed.  It both reduces the number of women who will be called back for extra views which then turn out to be normal, and it is better than the routine mammogram at detecting truly abnormal areas.     COLONOSCOPY:  Colonoscopy to screen for colon cancer is recommended for all women at age 50.  We know, you hate the idea of the prep.  We agree, BUT, having colon cancer and not knowing it is worse!!  Colon cancer so often starts as a polyp that can be seen and removed at colonscopy, which can quite literally save your life!  And if your first colonoscopy is normal and you have no family history of colon cancer, most women don't have to have it again for 10 years.  Once every ten years, you can do something that may end up saving your life, right?  We will be happy to help you get it scheduled when you are ready.  Be sure to check your insurance coverage so you understand how much it will cost.  It may be covered as a preventative service at no cost, but you should check your particular policy.   

## 2016-03-24 NOTE — Progress Notes (Signed)
Subjective:   Mikayla Washington is a 70 y.o. female who presents for Medicare Annual (Subsequent) preventive examination.  Review of Systems:    Review of Systems  Constitutional: Negative.   HENT: Negative.   Eyes: Negative.   Respiratory: Negative.   Cardiovascular: Negative.   Gastrointestinal: Negative.   Genitourinary: Negative.   Musculoskeletal: Negative.   Skin: Negative.   Neurological: Negative.   Endo/Heme/Allergies: Negative.   Psychiatric/Behavioral: Negative.        Objective:     HPI: Mikayla Washington is a 70 year old who presents to [facility] today a yearly health maintenance exam.   General Appearance:    Alert, cooperative, no distress, appears stated age  Head:    Normocephalic, without obvious abnormality, atraumatic  Eyes:    PERRL, conjunctiva/corneas clear, EOM's intact, fundi    benign, both eyes  Ears:    Normal TM's and external ear canals, both ears  Nose:   Nares normal, septum midline, mucosa normal, no drainage    or sinus tenderness  Throat:   Lips w/o lesion, mucosa moist, and tongue normal; teeth and   gums normal  Neck:   Supple, symmetrical, trachea midline, no adenopathy;    thyroid:  no enlargement/tenderness/nodules; no carotid   bruit or JVD  Back:     Symmetric, no curvature, ROM normal, no CVA tenderness  Lungs:     Clear to auscultation bilaterally, respirations unlabored, no       Wh/ R/ R  Chest Wall:    No tenderness or gross deformity; normal excursion   Heart:    Regular rate and rhythm, S1 and S2 normal, no murmur, rub   or gallop  Breast Exam:    No tenderness, masses, or nipple abnormality b/l; no d/c  Abdomen:     Soft, non-tender, bowel sounds active all four quadrants, NO   G/R/R, no masses, no organomegaly  Genitalia:    Deferred        Rectal:    Normal tone, no masses or tenderness;   guaiac negative stool; 2 small external hemorrhoids, no erythema or tenderness to palpation.   Extremities:    Extremities normal, atraumatic, no cyanosis or gross edema  Pulses:   2+ and symmetric all extremities  Skin:   Warm, dry, Skin color, texture, turgor normal, no obvious rashes or lesions Psych: No HI/SI, judgement and insight good, Euthymic mood. Full Affect.  Neurologic:   CNII-XII intact, normal strength, sensation and reflexes    Throughout        Vitals: BP 121/79   Pulse 84   Resp 17   Ht 5' 3.25" (1.607 m)   Wt 129 lb (58.5 kg)   SpO2 98%   BMI 22.67 kg/m   Body mass index is 22.67 kg/m.   Tobacco History  Smoking Status  . Never Smoker  Smokeless Tobacco  . Never Used     Counseling given: Not Answered   Past Medical History:  Diagnosis Date  . Adenomatous colon polyp 2013   Dr Sharlett Iles  . Breast disease    fibrocystic  . DJD (degenerative joint disease)    Past Surgical History:  Procedure Laterality Date  . ABDOMINAL HYSTERECTOMY    . BREAST LUMPECTOMY     X 3  . COLONOSCOPY  2002    Dr Sharlett Iles, negative  . colonoscopy with polypectomy  2013   adenomatous polyp  . TONSILLECTOMY    . TOTAL  ABDOMINAL HYSTERECTOMY W/ BILATERAL SALPINGOOPHORECTOMY  2001    benign ovarian tumor (BSO also)   Family History  Problem Relation Age of Onset  . Adopted: Yes   History  Sexual Activity  . Sexual activity: Yes  . Birth control/ protection: Surgical    Outpatient Encounter Prescriptions as of 03/24/2016  Medication Sig  . aspirin 81 MG tablet Take 81 mg by mouth daily.  . Cholecalciferol (VITAMIN D-3 PO) Take 1,000 Int'l Units by mouth daily.   . Multiple Vitamins-Minerals (CENTRUM SILVER PO) Take 1 tablet by mouth daily.   No facility-administered encounter medications on file as of 03/24/2016.     Activities of Daily Living In your present state of health, do you have difficulty performing the following activities?  1- Driving - no 2- Managing money - no 3- Feeding yourself - no 4- Getting from the bed to the chair - no 5- Climbing a  flight of stairs - no 6- Preparing food and eating - no 7- Bathing or showering - no 8- Getting dressed - no 9- Getting to the toilet - no 10- Using the toilet - no 11- Moving around from place to place - no   Patient Care Team: Mellody Dance, DO as PCP - General (Family Medicine) Calvert Cantor, MD as Consulting Physician (Ophthalmology) Sable Feil, MD as Consulting Physician (Gastroenterology) Calvert Cantor, MD as Consulting Physician (Ophthalmology)    Assessment:    1. Need for diphtheria-tetanus-pertussis (Tdap) vaccine Updated  - Tdap vaccine greater than or equal to 7yo IM - Hepatitis C antibody; Future - COMPLETE METABOLIC PANEL WITH GFR; Future - RPR; Future - Vitamin B12; Future - Sedimentation rate; Future - CBC; Future - TSH; Future - Folate; Future - HIV antibody; Future - Lipid panel; Future - VITAMIN D 25 Hydroxy (Vit-D Deficiency, Fractures); Future - Hepatitis C antibody - COMPLETE METABOLIC PANEL WITH GFR - RPR - Vitamin B12 - Sedimentation rate - CBC - TSH - Folate - HIV antibody - Lipid panel - VITAMIN D 25 Hydroxy (Vit-D Deficiency, Fractures)  2. Encounter for Medicare annual wellness exam  - Hepatitis C antibody; Future - COMPLETE METABOLIC PANEL WITH GFR; Future - RPR; Future - Vitamin B12; Future - Sedimentation rate; Future - CBC; Future - TSH; Future - Folate; Future - HIV antibody; Future - Lipid panel; Future - VITAMIN D 25 Hydroxy (Vit-D Deficiency, Fractures); Future - Hepatitis C antibody - COMPLETE METABOLIC PANEL WITH GFR - RPR - Vitamin B12 - Sedimentation rate - CBC - TSH - Folate - HIV antibody - Lipid panel - VITAMIN D 25 Hydroxy (Vit-D Deficiency, Fractures)  3. Counseling on health promotion and disease prevention   4. Encounter for wellness examination - POCT glycosylated hemoglobin (Hb A1C)  5. Age related osteoporosis, unspecified pathological fracture presence D/c pt her bone density results-  t-scores above 2.5 yet stable in femur since 2010, yet in spine- worse --> to 3.2. --spoke with pt about bis=phos, estrogen etc--> she will look at the literature and let me know.  Doesn't want to start meds.  Take Vit D and ca--> doses d/c pt.   6. Vitamin D deficiency Continue supplementation  7. Menopausal and postmenopausal disorder Declines estrogen therapy.   Exercise Activities and Dietary recommendations- handouts provided.    Goals    None     Fall Risk Fall Risk  03/24/2016 02/22/2016  Falls in the past year? No No   Depression Screen PHQ 2/9 Scores 03/24/2016 02/22/2016  PHQ - 2  Score 0 0     Cognitive Function     6CIT Screen 03/24/2016  What Year? 0 points  What month? 0 points  What time? 0 points  Count back from 20 0 points  Months in reverse 0 points  Repeat phrase 0 points  Total Score 0    Immunization History  Administered Date(s) Administered  . Influenza,inj,Quad PF,36+ Mos 02/18/2014  . Influenza-Unspecified 01/08/2016  . Pneumococcal Conjugate-13 11/28/2014  . Pneumococcal Polysaccharide-23 04/09/2007, 02/28/2011  . Td 11/16/2001  . Tdap 03/24/2016  . Zoster 05/07/2007   Screening Tests Health Maintenance  Topic Date Due  . Hepatitis C Screening  1946-03-10  . MAMMOGRAM  01/05/2018  . COLONOSCOPY  09/16/2019  . TETANUS/TDAP  03/24/2026  . INFLUENZA VACCINE  Completed  . DEXA SCAN  Completed  . ZOSTAVAX  Completed  . PNA vac Low Risk Adult  Completed      Plan:      Please see orders section below for further details of actions taken during this office visit.  Gross side effects, risk and benefits, and alternatives of medications discussed with patient.  Patient is aware that all medications have potential side effects and we are unable to predict every side effect or drug-drug interaction that may occur.  Expresses verbal understanding and consents to current therapy plan and treatment regiment.  1) Anticipatory  Guidance: Discussed importance of wearing a seatbelt while driving, not texting while driving; sunscreen when outside along with yearly skin surveillance; eating a well balanced and modest diet; physical activity at least 25 minutes per day or 150 min/ week of moderate to intense activity.  2) Immunizations / Screenings / Labs:  All immunizations and screenings that patient agrees to, are up-to-date per recommendations or will be updated today.  Patient understands the needs for q 63mo dental and yearly vision screens which pt will schedule independently. Obtain blood work today after discussed with patient which ones would be obtaining. She asked for hepatitis B, C and HIV. Although she is just monogamous with her husband.  3) Weight:   Discussed goal of losing even 5-10% of current body weight which would improve overall feelings of well being and improve objective health data significantly.   Improve nutrient density of diet through increasing intake of fruits and vegetables and decreasing saturated/trans fats, white flour products and refined sugar products.   F-up preventative CPE in 1 year. F/up sooner for chronic care management as discussed and/or prn.  Please see orders placed and AVS handed out to patient at the end of our visit for further patient instructions/ counseling done pertaining to today's office visit.     During the course of the visit the patient was educated and counseled about the following appropriate screening and preventive services:   Vaccines to include Pneumoccal, Influenza, Hepatitis B, Td, Zostavax, HCV  Electrocardiogram  Cardiovascular Disease  Colorectal cancer screening  Bone density screening  Diabetes screening  Glaucoma screening  Mammography/PAP  Nutrition counseling   Patient Instructions (the written plan) was given to the patient.   Mellody Dance, DO  03/24/2016

## 2016-03-25 LAB — CBC
HEMATOCRIT: 40.5 % (ref 35.0–45.0)
HEMOGLOBIN: 13.2 g/dL (ref 11.7–15.5)
MCH: 29.3 pg (ref 27.0–33.0)
MCHC: 32.6 g/dL (ref 32.0–36.0)
MCV: 89.8 fL (ref 80.0–100.0)
MPV: 12 fL (ref 7.5–12.5)
Platelets: 258 10*3/uL (ref 140–400)
RBC: 4.51 MIL/uL (ref 3.80–5.10)
RDW: 15 % (ref 11.0–15.0)
WBC: 7.1 10*3/uL (ref 3.8–10.8)

## 2016-03-25 LAB — VITAMIN B12: Vitamin B-12: 281 pg/mL (ref 200–1100)

## 2016-03-25 LAB — FOLATE: Folate: >24 ng/mL (ref >5.4)

## 2016-03-26 LAB — HEPATITIS C ANTIBODY: HCV Ab: NEGATIVE

## 2016-03-26 LAB — COMPLETE METABOLIC PANEL WITH GFR
ALBUMIN: 4.5 g/dL (ref 3.6–5.1)
ALK PHOS: 82 U/L (ref 33–130)
ALT: 12 U/L (ref 6–29)
AST: 15 U/L (ref 10–35)
BILIRUBIN TOTAL: 0.5 mg/dL (ref 0.2–1.2)
BUN: 15 mg/dL (ref 7–25)
CALCIUM: 9.9 mg/dL (ref 8.6–10.4)
CO2: 28 mmol/L (ref 20–31)
CREATININE: 0.75 mg/dL (ref 0.60–0.93)
Chloride: 101 mmol/L (ref 98–110)
GFR, Est African American: >89 mL/min (ref >=60)
GFR, Est Non African American: 81 mL/min (ref >=60)
Glucose, Bld: 60 mg/dL — ABNORMAL LOW (ref 65–99)
POTASSIUM: 4.8 mmol/L (ref 3.5–5.3)
Sodium: 141 mmol/L (ref 135–146)
TOTAL PROTEIN: 7 g/dL (ref 6.1–8.1)

## 2016-03-26 LAB — RPR

## 2016-03-26 LAB — HEMOGLOBIN A1C
Hgb A1c MFr Bld: 5.4 % (ref <5.7)
MEAN PLASMA GLUCOSE: 108 mg/dL

## 2016-03-26 LAB — LIPID PANEL
CHOLESTEROL: 184 mg/dL (ref <200)
HDL: 69 mg/dL (ref >50)
LDL Cholesterol: 98 mg/dL (ref <100)
TRIGLYCERIDES: 84 mg/dL (ref <150)
Total CHOL/HDL Ratio: 2.7 Ratio (ref <5.0)
VLDL: 17 mg/dL (ref <30)

## 2016-03-26 LAB — TSH: TSH: 1.22 mIU/L

## 2016-03-26 LAB — VITAMIN D 25 HYDROXY (VIT D DEFICIENCY, FRACTURES): Vit D, 25-Hydroxy: 33 ng/mL (ref 30–100)

## 2016-03-26 LAB — SEDIMENTATION RATE: Sed Rate: 1 mm/hr (ref 0–30)

## 2016-03-26 LAB — HIV ANTIBODY (ROUTINE TESTING W REFLEX): HIV 1&2 Ab, 4th Generation: NONREACTIVE

## 2016-09-06 ENCOUNTER — Encounter: Payer: Self-pay | Admitting: Internal Medicine

## 2016-11-22 ENCOUNTER — Encounter: Payer: Self-pay | Admitting: Adult Health

## 2016-11-22 ENCOUNTER — Ambulatory Visit (INDEPENDENT_AMBULATORY_CARE_PROVIDER_SITE_OTHER): Payer: Medicare Other | Admitting: Adult Health

## 2016-11-22 ENCOUNTER — Encounter: Payer: Self-pay | Admitting: Family Medicine

## 2016-11-22 ENCOUNTER — Ambulatory Visit: Payer: Medicare Other

## 2016-11-22 VITALS — BP 119/71 | HR 82 | Temp 98.3°F | Ht 63.25 in | Wt 126.5 lb

## 2016-11-22 DIAGNOSIS — R058 Other specified cough: Secondary | ICD-10-CM | POA: Insufficient documentation

## 2016-11-22 DIAGNOSIS — R05 Cough: Secondary | ICD-10-CM

## 2016-11-22 DIAGNOSIS — J0100 Acute maxillary sinusitis, unspecified: Secondary | ICD-10-CM | POA: Diagnosis not present

## 2016-11-22 MED ORDER — HYDROCODONE-HOMATROPINE 5-1.5 MG PO TABS: 1.0000 | ORAL_TABLET | Freq: Two times a day (BID) | ORAL | 0 refills | Status: DC | PRN

## 2016-11-22 MED ORDER — AMOXICILLIN-POT CLAVULANATE 875-125 MG PO TABS: 1.0000 | ORAL_TABLET | Freq: Two times a day (BID) | ORAL | 0 refills | Status: DC

## 2016-11-22 MED ORDER — FLUTICASONE PROPIONATE 50 MCG/ACT NA SUSP: 2.0000 | Freq: Every day | NASAL | 6 refills | Status: DC

## 2016-11-22 NOTE — Patient Instructions (Signed)
Acute Bronchitis, Adult Acute bronchitis is sudden (acute) swelling of the air tubes (bronchi) in the lungs. Acute bronchitis causes these tubes to fill with mucus, which can make it hard to breathe. It can also cause coughing or wheezing. In adults, acute bronchitis usually goes away within 2 weeks. A cough caused by bronchitis may last up to 3 weeks. Smoking, allergies, and asthma can make the condition worse. Repeated episodes of bronchitis may cause further lung problems, such as chronic obstructive pulmonary disease (COPD). What are the causes? This condition can be caused by germs and by substances that irritate the lungs, including:  Cold and flu viruses. This condition is most often caused by the same virus that causes a cold.  Bacteria.  Exposure to tobacco smoke, dust, fumes, and air pollution.  What increases the risk? This condition is more likely to develop in people who:  Have close contact with someone with acute bronchitis.  Are exposed to lung irritants, such as tobacco smoke, dust, fumes, and vapors.  Have a weak immune system.  Have a respiratory condition such as asthma.  What are the signs or symptoms? Symptoms of this condition include:  A cough.  Coughing up clear, yellow, or green mucus.  Wheezing.  Chest congestion.  Shortness of breath.  A fever.  Body aches.  Chills.  A sore throat.  How is this diagnosed? This condition is usually diagnosed with a physical exam. During the exam, your health care provider may order tests, such as chest X-rays, to rule out other conditions. He or she may also:  Test a sample of your mucus for bacterial infection.  Check the level of oxygen in your blood. This is done to check for pneumonia.  Do a chest X-ray or lung function testing to rule out pneumonia and other conditions.  Perform blood tests.  Your health care provider will also ask about your symptoms and medical history. How is this  treated? Most cases of acute bronchitis clear up over time without treatment. Your health care provider may recommend:  Drinking more fluids. Drinking more makes your mucus thinner, which may make it easier to breathe.  Taking a medicine for a fever or cough.  Taking an antibiotic medicine.  Using an inhaler to help improve shortness of breath and to control a cough.  Using a cool mist vaporizer or humidifier to make it easier to breathe.  Follow these instructions at home: Medicines  Take over-the-counter and prescription medicines only as told by your health care provider.  If you were prescribed an antibiotic, take it as told by your health care provider. Do not stop taking the antibiotic even if you start to feel better. General instructions  Get plenty of rest.  Drink enough fluids to keep your urine clear or pale yellow.  Avoid smoking and secondhand smoke. Exposure to cigarette smoke or irritating chemicals will make bronchitis worse. If you smoke and you need help quitting, ask your health care provider. Quitting smoking will help your lungs heal faster.  Use an inhaler, cool mist vaporizer, or humidifier as told by your health care provider.  Keep all follow-up visits as told by your health care provider. This is important. How is this prevented? To lower your risk of getting this condition again:  Wash your hands often with soap and water. If soap and water are not available, use hand sanitizer.  Avoid contact with people who have cold symptoms.  Try not to touch your hands to your   mouth, nose, or eyes.  Make sure to get the flu shot every year.  Contact a health care provider if:  Your symptoms do not improve in 2 weeks of treatment. Get help right away if:  You cough up blood.  You have chest pain.  You have severe shortness of breath.  You become dehydrated.  You faint or keep feeling like you are going to faint.  You keep vomiting.  You have a  severe headache.  Your fever or chills gets worse. This information is not intended to replace advice given to you by your health care provider. Make sure you discuss any questions you have with your health care provider. Document Released: 05/12/2004 Document Revised: 10/28/2015 Document Reviewed: 09/23/2015 Elsevier Interactive Patient Education  2017 [employer]   Sinusitis, Adult Sinusitis is soreness and inflammation of your sinuses. Sinuses are hollow spaces in the bones around your face. Your sinuses are located:  Around your eyes.  In the middle of your forehead.  Behind your nose.  In your cheekbones.  Your sinuses and nasal passages are lined with a stringy fluid (mucus). Mucus normally drains out of your sinuses. When your nasal tissues become inflamed or swollen, the mucus can become trapped or blocked so air cannot flow through your sinuses. This allows bacteria, viruses, and funguses to grow, which leads to infection. Sinusitis can develop quickly and last for 7?10 days (acute) or for more than 12 weeks (chronic). Sinusitis often develops after a cold. What are the causes? This condition is caused by anything that creates swelling in the sinuses or stops mucus from draining, including:  Allergies.  Asthma.  Bacterial or viral infection.  Abnormally shaped bones between the nasal passages.  Nasal growths that contain mucus (nasal polyps).  Narrow sinus openings.  Pollutants, such as chemicals or irritants in the air.  A foreign object stuck in the nose.  A fungal infection. This is rare.  What increases the risk? The following factors may make you more likely to develop this condition:  Having allergies or asthma.  Having had a recent cold or respiratory tract infection.  Having structural deformities or blockages in your nose or sinuses.  Having a weak immune system.  Doing a lot of swimming or diving.  Overusing nasal  sprays.  Smoking.  What are the signs or symptoms? The main symptoms of this condition are pain and a feeling of pressure around the affected sinuses. Other symptoms include:  Upper toothache.  Earache.  Headache.  Bad breath.  Decreased sense of smell and taste.  A cough that may get worse at night.  Fatigue.  Fever.  Thick drainage from your nose. The drainage is often green and it may contain pus (purulent).  Stuffy nose or congestion.  Postnasal drip. This is when extra mucus collects in the throat or back of the nose.  Swelling and warmth over the affected sinuses.  Sore throat.  Sensitivity to light.  How is this diagnosed? This condition is diagnosed based on symptoms, a medical history, and a physical exam. To find out if your condition is acute or chronic, your health care provider may:  Look in your nose for signs of nasal polyps.  Tap over the affected sinus to check for signs of infection.  View the inside of your sinuses using an imaging device that has a light attached (endoscope).  If your health care provider suspects that you have chronic sinusitis, you may also:  Be tested for allergies.  Have a sample of mucus taken from your nose (nasal culture) and checked for bacteria.  Have a mucus sample examined to see if your sinusitis is related to an allergy.  If your sinusitis does not respond to treatment and it lasts longer than 8 weeks, you may have an MRI or CT scan to check your sinuses. These scans also help to determine how severe your infection is. In rare cases, a bone biopsy may be done to rule out more serious types of fungal sinus disease. How is this treated? Treatment for sinusitis depends on the cause and whether your condition is chronic or acute. If a virus is causing your sinusitis, your symptoms will go away on their own within 10 days. You may be given medicines to relieve your symptoms, including:  Topical nasal decongestants.  They shrink swollen nasal passages and let mucus drain from your sinuses.  Antihistamines. These drugs block inflammation that is triggered by allergies. This can help to ease swelling in your nose and sinuses.  Topical nasal corticosteroids. These are nasal sprays that ease inflammation and swelling in your nose and sinuses.  Nasal saline washes. These rinses can help to get rid of thick mucus in your nose.  If your condition is caused by bacteria, you will be given an antibiotic medicine. If your condition is caused by a fungus, you will be given an antifungal medicine. Surgery may be needed to correct underlying conditions, such as narrow nasal passages. Surgery may also be needed to remove polyps. Follow these instructions at home: Medicines  Take, use, or apply over-the-counter and prescription medicines only as told by your health care provider. These may include nasal sprays.  If you were prescribed an antibiotic medicine, take it as told by your health care provider. Do not stop taking the antibiotic even if you start to feel better. Hydrate and Humidify  Drink enough water to keep your urine clear or pale yellow. Staying hydrated will help to thin your mucus.  Use a cool mist humidifier to keep the humidity level in your home above 50%.  Inhale steam for 10-15 minutes, 3-4 times a day or as told by your health care provider. You can do this in the bathroom while a hot shower is running.  Limit your exposure to cool or dry air. Rest  Rest as much as possible.  Sleep with your head raised (elevated).  Make sure to get enough sleep each night. General instructions  Apply a warm, moist washcloth to your face 3-4 times a day or as told by your health care provider. This will help with discomfort.  Wash your hands often with soap and water to reduce your exposure to viruses and other germs. If soap and water are not available, use hand sanitizer.  Do not smoke. Avoid being  around people who are smoking (secondhand smoke).  Keep all follow-up visits as told by your health care provider. This is important. Contact a health care provider if:  You have a fever.  Your symptoms get worse.  Your symptoms do not improve within 10 days. Get help right away if:  You have a severe headache.  You have persistent vomiting.  You have pain or swelling around your face or eyes.  You have vision problems.  You develop confusion.  Your neck is stiff.  You have trouble breathing. This information is not intended to replace advice given to you by your health care provider. Make sure you discuss any questions you  have with your health care provider. Document Released: 04/04/2005 Document Revised: 11/29/2015 Document Reviewed: 01/28/2015 Elsevier Interactive Patient Education  2017 [employer].    We will call when chest xray results are available. Increase fluids/rest/vit c. Please take Augmentin, Flonase, and cough medicine as directed. Do not drive or drink EOTH when taking EOTH. Work excuse provided through 11/25/16-REST. If symptoms persist after antibiotic completed, then please call clinic. FEEL BETTER!

## 2016-11-22 NOTE — Assessment & Plan Note (Addendum)
CXR to r/o pnuemonia. Results: negative for acute diease, pt called and informed of results. Augmentin to cover for respiratory and sinus conditions. [place] Controlled Substance Website verified-no contraindications noted to Rx narcotic cough tabs. Increase fluids/rest/vit c-work excuse provided to allow for rest.

## 2016-11-22 NOTE — Assessment & Plan Note (Signed)
Augmentin Flonase Increase fluids/rest/vit c-work excuse provided to allow for rest.

## 2016-11-22 NOTE — Progress Notes (Signed)
Subjective:    Patient ID: Mikayla Washington, female    DOB: Apr 11, 1946, 71 y.o.   MRN: [id number]  HPI:  Mikayla Washington presents with productive, persistent cough (thick/white mucus), clear/copious nasal drainage, facial tenderness, and pronounced fatigue.  Sx's started >2 weeks ago and are worsening daily.  She has been taking OTC Mucinex DM with only minor sx relief. She reports that her cough becomes quite strong at night and it is causing difficulty. She denies fever, however has had night sweats.  She denies CP/dyspnea at rest/palpiations/N/V/D.  She continues to work PT and denies active tobacco use.  Patient Care Team    Relationship Specialty Notifications Start End  Mellody Dance, DO PCP - General Family Medicine  02/22/16   Calvert Cantor, MD Consulting Physician Ophthalmology  02/22/16   Sable Feil, MD Consulting Physician Gastroenterology  02/22/16    Comment: Rudolpho Sevin    Patient Active Problem List   Diagnosis Date Noted  . Productive cough 11/22/2016  . Acute maxillary sinusitis 11/22/2016  . Status post hysterectomy with oophorectomy in 2000 02/22/2016  . Hemorrhoids 02/22/2016  . Elevated HDL 02/22/2016  . Adopted person 02/22/2016  . Allergic rhinitis 11/28/2014  . Eustachian tube dysfunction 05/31/2014  . Liver cyst 02/18/2014  . Chronic constipation 01/11/2014  . HLD (hyperlipidemia) 11/26/2013  . Hx of adenomatous colonic polyps 11/02/2012  . Vitamin D deficiency 11/02/2012  . Osteopenia 11/02/2012  . Irritable bowel syndrome (IBS) 11/02/2012  . Tinnitus, subjective 11/02/2012  . Menopausal and postmenopausal disorder 11/03/2008  . Osteoarthritis 11/03/2008     Past Medical History:  Diagnosis Date  . Adenomatous colon polyp 2013   Dr Sharlett Iles  . Breast disease    fibrocystic  . DJD (degenerative joint disease)      Past Surgical History:  Procedure Laterality Date  . ABDOMINAL HYSTERECTOMY    . BREAST LUMPECTOMY     X 3  . COLONOSCOPY  2002    Dr Sharlett Iles, negative  . colonoscopy with polypectomy  2013   adenomatous polyp  . TONSILLECTOMY    . TOTAL ABDOMINAL HYSTERECTOMY W/ BILATERAL SALPINGOOPHORECTOMY  2001    benign ovarian tumor (BSO also)     Family History  Problem Relation Age of Onset  . Adopted: Yes     History  Drug Use No     History  Alcohol Use No    Comment: rare, 1 x per year or at celebrations      History  Smoking Status  . Never Smoker  Smokeless Tobacco  . Never Used     Outpatient Encounter Prescriptions as of 11/22/2016  Medication Sig  . Cholecalciferol (VITAMIN D-3 PO) Take 1,000 Int'l Units by mouth daily.   . Multiple Vitamins-Minerals (CENTRUM SILVER PO) Take 1 tablet by mouth daily.  . [DISCONTINUED] aspirin 81 MG tablet Take 81 mg by mouth daily.  Marland Kitchen amoxicillin-clavulanate (AUGMENTIN) 875-125 MG tablet Take 1 tablet by mouth 2 (two) times daily.  . fluticasone (FLONASE) 50 MCG/ACT nasal spray Place 2 sprays into both nostrils daily.  . Hydrocodone-Homatropine 5-1.5 MG TABS Take 1 tablet by mouth 2 (two) times daily as needed.   No facility-administered encounter medications on file as of 11/22/2016.     Allergies: Patient has no known allergies.  Body mass index is 22.23 kg/m.  Blood pressure 119/71, pulse 82, temperature 98.3 F (36.8 C), temperature source Oral, height 5' 3.25" (1.607 m), weight 126 lb 8 oz (57.4 kg), SpO2 97 %.  Review of Systems  Constitutional: Positive for activity change, chills, diaphoresis and fatigue. Negative for appetite change, fever and unexpected weight change.  HENT: Positive for congestion, postnasal drip, rhinorrhea, sinus pressure and sneezing. Negative for sore throat, tinnitus, trouble swallowing and voice change.   Eyes: Negative for visual disturbance.  Respiratory: Positive for cough. Negative for chest tightness, shortness of breath, wheezing and stridor.   Cardiovascular: Negative for chest pain, palpitations and leg  swelling.  Gastrointestinal: Negative for abdominal distention, abdominal pain, blood in stool, constipation, diarrhea, nausea and vomiting.  Endocrine: Negative for cold intolerance, heat intolerance, polydipsia, polyphagia and polyuria.  Genitourinary: Negative for difficulty urinating, flank pain and hematuria.  Skin: Negative for color change, pallor, rash and wound.  Neurological: Negative for dizziness, tremors, weakness and headaches.  Hematological: Does not bruise/bleed easily.  Psychiatric/Behavioral: Positive for sleep disturbance. The patient is not nervous/anxious.        Objective:   Physical Exam  Constitutional: She is oriented to person, place, and time. She appears well-developed and well-nourished. No distress.  HENT:  Head: Normocephalic and atraumatic.  Right Ear: Hearing, external ear and ear canal normal. Tympanic membrane is bulging. Tympanic membrane is not erythematous. No decreased hearing is noted.  Left Ear: Hearing, external ear and ear canal normal. Tympanic membrane is bulging. Tympanic membrane is not erythematous. No decreased hearing is noted.  Nose: Mucosal edema and rhinorrhea present. Right sinus exhibits maxillary sinus tenderness and frontal sinus tenderness. Left sinus exhibits maxillary sinus tenderness and frontal sinus tenderness.  Mouth/Throat: Uvula is midline and mucous membranes are normal. Posterior oropharyngeal erythema present.  Eyes: Pupils are equal, round, and reactive to light. Conjunctivae are normal.  Neck: Normal range of motion. Neck supple.  Cardiovascular: Normal rate, regular rhythm, normal heart sounds and intact distal pulses.   No murmur heard. Pulmonary/Chest: No respiratory distress. She has decreased breath sounds in the right lower field. She has no wheezes. She has no rales. She exhibits no tenderness.  Lymphadenopathy:    She has no cervical adenopathy.  Neurological: She is alert and oriented to person, place, and  time. Coordination normal.  Skin: Skin is warm and dry. No rash noted. She is not diaphoretic. No erythema. No pallor.  Psychiatric: She has a normal mood and affect. Her behavior is normal. Judgment and thought content normal.  Nursing note and vitals reviewed.         Assessment & Plan:   1. Productive cough   2. Acute maxillary sinusitis, recurrence not specified     Productive cough CXR to r/o pnuemonia. Results: negative for acute diease, pt called and informed of results. Augmentin to cover for respiratory and sinus conditions. [place] Controlled Substance Website verified-no contraindications noted to Rx narcotic cough tabs. Increase fluids/rest/vit c-work excuse provided to allow for rest.  Acute maxillary sinusitis Augmentin Flonase Increase fluids/rest/vit c-work excuse provided to allow for rest.    FOLLOW-UP:  Return if symptoms worsen or fail to improve.

## 2017-01-04 ENCOUNTER — Telehealth: Payer: Self-pay | Admitting: Family Medicine

## 2017-01-04 NOTE — Telephone Encounter (Signed)
Patient is requesting a prescription for a shingles vaccine sent to [facility] on [address]

## 2017-01-05 ENCOUNTER — Other Ambulatory Visit: Payer: Self-pay

## 2017-01-05 DIAGNOSIS — Z23 Encounter for immunization: Secondary | ICD-10-CM

## 2017-01-05 NOTE — Telephone Encounter (Signed)
Sent to pharmacy called patient left message.  MPulliam, CMA/RT(R)

## 2017-01-16 ENCOUNTER — Encounter: Payer: Self-pay | Admitting: Internal Medicine

## 2017-01-17 ENCOUNTER — Ambulatory Visit: Payer: Medicare Other

## 2017-01-17 ENCOUNTER — Encounter: Payer: Self-pay | Admitting: Family Medicine

## 2017-01-17 ENCOUNTER — Ambulatory Visit (INDEPENDENT_AMBULATORY_CARE_PROVIDER_SITE_OTHER): Payer: Medicare Other | Admitting: Family Medicine

## 2017-01-17 VITALS — BP 104/66 | HR 85 | Ht 63.25 in | Wt 128.1 lb

## 2017-01-17 DIAGNOSIS — S8001XA Contusion of right knee, initial encounter: Secondary | ICD-10-CM

## 2017-01-17 DIAGNOSIS — M25561 Pain in right knee: Secondary | ICD-10-CM | POA: Diagnosis not present

## 2017-01-17 DIAGNOSIS — Z23 Encounter for immunization: Secondary | ICD-10-CM | POA: Diagnosis not present

## 2017-01-17 MED ORDER — ZOSTER VAC RECOMB ADJUVANTED 50 MCG/0.5ML IM SUSR: 0.5000 mL | Freq: Once | INTRAMUSCULAR | 0 refills | Status: AC

## 2017-01-17 NOTE — Progress Notes (Addendum)
Pt here for an acute care OV today   Impression and Recommendations:    1. Contusion of right knee, initial encounter   2. Acute pain of right knee   3. Need for vaccination for zoster    1. DG Knee 1-2 Views R- No acute abnormality, results communicated to pt; patient felt reassured.   2 supportive care of contusion discussed with patient.  RICE principles discussed with patient.  3 If symptoms worsen or do not improve as anticipated, return to clinic in 2-4 weeks for repeat x-ray possibly and reassessment. 4  vaccines updated.  The patient was counseled, risk factors were discussed, anticipatory guidance given.   Meds ordered this encounter  Medications  . Zoster Vac Recomb Adjuvanted [facility]) injection    Sig: Inject 0.5 mLs into the muscle once.    Dispense:  0.5 mL    Refill:  0    Orders Placed This Encounter  Procedures  . DG Knee 1-2 Views Right    Gross side effects, risk and benefits, and alternatives of medications and treatment plan in general discussed with patient.  Patient is aware that all medications have potential side effects and we are unable to predict every side effect or drug-drug interaction that may occur.   Patient will call with any questions prior to using medication if they have concerns.  Expresses verbal understanding and consents to current therapy and treatment regimen.  No barriers to understanding were identified.  Red flag symptoms and signs discussed in detail.  Patient expressed understanding regarding what to do in case of emergency\urgent symptoms  Please see AVS handed out to patient at the end of our visit for further patient instructions/ counseling done pertaining to today's office visit.  Return if symptoms worsen or fail to improve.    Note: This document was prepared using Dragon voice recognition software and may include unintentional dictation errors.  Mellody Dance 4:02  PM --------------------------------------------------------------------------------------------------------------------------------------------------------------------------------------------------------------------------------------------    Subjective:    CC:  Chief Complaint  Patient presents with  . Knee Pain    fell x 3 days, right knee pain    HPI: Mikayla Washington is a 71 y.o. female who presents to [place] at [facility] today for issues as discussed below.  Patient had a date of injury on September 29 where she tripped and fell forward onto her right knee causing contusion.   It is now slightly swollen and painful.   Pt is worried she "broke something"/ has a fracture.  She is able to ambulate on it and do her ADLs but it feels sore and stiff especially in the mornings.  She has not been doing any treatment for it as she didn't know what to do.     Wt Readings from Last 3 Encounters:  03/20/17 123 lb (55.8 kg)  03/06/17 123 lb (55.8 kg)  02/27/17 126 lb (57.2 kg)   BP Readings from Last 3 Encounters:  03/20/17 103/76  02/27/17 110/70  01/17/17 104/66   BMI Readings from Last 3 Encounters:  03/20/17 21.11 kg/m  03/06/17 21.11 kg/m  02/27/17 22.14 kg/m     Patient Care Team    Relationship Specialty Notifications Start End  Mellody Dance, DO PCP - General Family Medicine  02/22/16   Calvert Cantor, MD Consulting Physician Ophthalmology  02/22/16   Sable Feil, MD Consulting Physician Gastroenterology  02/22/16    Comment: Rudolpho Sevin     Patient Active Problem List  Diagnosis Date Noted  . Productive cough 11/22/2016  . Acute maxillary sinusitis 11/22/2016  . Status post hysterectomy with oophorectomy in 2000 02/22/2016  . Hemorrhoids 02/22/2016  . Elevated HDL 02/22/2016  . Adopted person 02/22/2016  . Allergic rhinitis 11/28/2014  . Eustachian tube dysfunction 05/31/2014  . Liver cyst 02/18/2014  . Chronic constipation 01/11/2014   . HLD (hyperlipidemia) 11/26/2013  . Hx of adenomatous colonic polyps 11/02/2012  . Vitamin D deficiency 11/02/2012  . Osteopenia 11/02/2012  . Irritable bowel syndrome (IBS) 11/02/2012  . Tinnitus, subjective 11/02/2012  . Menopausal and postmenopausal disorder 11/03/2008  . Osteoarthritis 11/03/2008    Past Medical history, Surgical history, Family history, Social history, Allergies and Medications have been entered into the medical record, reviewed and changed as needed.    Current Meds  Medication Sig  . Multiple Vitamins-Minerals (CENTRUM SILVER PO) Take 1 tablet by mouth daily.  . [DISCONTINUED] Cholecalciferol (VITAMIN D-3 PO) Take 1,000 Int'l Units by mouth daily.   . [DISCONTINUED] fluticasone (FLONASE) 50 MCG/ACT nasal spray Place 2 sprays into both nostrils daily. (Patient not taking: Reported on 03/06/2017)  . [DISCONTINUED] Hydrocodone-Homatropine 5-1.5 MG TABS Take 1 tablet by mouth 2 (two) times daily as needed.    Allergies:  No Known Allergies   Review of Systems: General:   Denies fever, chills, unexplained weight loss.  Optho/Auditory:   Denies visual changes, blurred vision/LOV Respiratory:   Denies wheeze, DOE more than baseline levels.  Cardiovascular:   Denies chest pain, palpitations, new onset peripheral edema  Gastrointestinal:   Denies nausea, vomiting, diarrhea, abd pain.  Genitourinary: Denies dysuria, freq/ urgency, flank pain or discharge from genitals.  Endocrine:     Denies hot or cold intolerance, polyuria, polydipsia. Musculoskeletal:   Denies unexplained myalgias, joint swelling, unexplained arthralgias, gait problems.  Skin:  Denies new onset rash, suspicious lesions Neurological:     Denies dizziness, unexplained weakness, numbness  Psychiatric/Behavioral:   Denies mood changes, suicidal or homicidal ideations, hallucinations    Objective:   Blood pressure 104/66, pulse 85, height 5' 3.25" (1.607 m), weight 128 lb 1.6 oz (58.1  kg). Body mass index is 22.51 kg/m. General:  Well Developed, well nourished, appropriate for stated age.  Neuro:  Alert and oriented,  extra-ocular muscles intact  HEENT:  Normocephalic, atraumatic, neck supple Skin:  no gross rash, warm, pink. Knee: Normal to inspection with no obvious bony abnormalities.  There is mild edema and ecchymosis on the anterior infrapatellar knee region. Palpation mildly tender over ecchymotic region otherwise with no warmth, joint line tenderness, patellar tenderness, or condyle tenderness. ROM full in flexion and extension and lower leg rotation. Ligaments with solid endpoint including ACL, PCL, LCL, MCL. Non painful patellar compression. Patellar glide without crepitus. Respiratory:  Not using accessory muscles, speaking in full sentences- unlabored. Vascular:  Ext warm, no cyanosis apprec.; cap RF less 2 sec. Psych:  No HI/SI, judgement and insight good, Euthymic mood. Full Affect.

## 2017-01-17 NOTE — Patient Instructions (Signed)

## 2017-02-27 ENCOUNTER — Ambulatory Visit: Payer: Medicare Other | Admitting: Family Medicine

## 2017-02-27 ENCOUNTER — Encounter: Payer: Self-pay | Admitting: Family Medicine

## 2017-02-27 VITALS — BP 110/70 | HR 98 | Ht 63.25 in | Wt 126.0 lb

## 2017-02-27 DIAGNOSIS — M609 Myositis, unspecified: Secondary | ICD-10-CM

## 2017-02-27 DIAGNOSIS — G5622 Lesion of ulnar nerve, left upper limb: Secondary | ICD-10-CM

## 2017-02-27 DIAGNOSIS — M791 Myalgia, unspecified site: Secondary | ICD-10-CM

## 2017-02-27 NOTE — Patient Instructions (Signed)
Use heat for muscle spasms. If pain-can use ice in areas where her muscles are terribly spasming and inflamed.  Please avoid any aggravating activity or any repetitive lifting etc. as this will further aggravate the area.  It will take at least 2-3 weeks for these muscles to calm down.  If you change your mind on the muscle relaxer please let me know.  For short-term pain relief please continue the over-the-counter NSAIDs that you are using.    TENS UNIT: This is helpful for muscle pain and spasm.   Search and Purchase a TENS 7000 2nd edition at www.tenspros.com. It should be less than $30.     TENS unit instructions: Do not shower or bathe with the unit on Turn the unit off before removing electrodes or batteries If the electrodes lose stickiness add a drop of water to the electrodes after they are disconnected from the unit and place on plastic sheet. If you continued to have difficulty, call the TENS unit company to purchase more electrodes. Do not apply lotion on the skin area prior to use. Make sure the skin is clean and dry as this will help prolong the life of the electrodes. After use, always check skin for unusual red areas, rash or other skin difficulties. If there are any skin problems, does not apply electrodes to the same area. Never remove the electrodes from the unit by pulling the wires. Do not use the TENS unit or electrodes other than as directed. Do not change electrode placement without consultating your therapist or physician. Keep 2 fingers with between each electrode. Wear time ratio is 2:1, on to off times.    For example on for 30 minutes off for 15 minutes and then on for 30 minutes off for 15 minutes

## 2017-02-27 NOTE — Progress Notes (Signed)
Impression and Recommendations:    1. Myalgia   2. Myositis, unspecified myositis type, unspecified site   3. Entrapment of left ulnar nerve         Return if symptoms worsen or fail to improve, for Medicare wellness visit near future.  Gross side effects, risk and benefits, and alternatives of medications discussed with patient.  Patient is aware that all medications have potential side effects and we are unable to predict every sideeffect or drug-drug interaction that may occur.  Expresses verbal understanding and consents to current therapy plan and treatment regiment.  Please see AVS handed out to patient at the end of our visit for further patient instructions/ counseling done pertaining to today's office visit.  -------------------------------------------------------------------------------------------------------------------------------------------------------------------------------------------------------------------------------------------- Subjective:   CC: BACK PAIN Lifted heavier items this past Fri.,  Sat and Sun - lifting 20 pounds or more more than she normally does.  Location: thoracic-paravertebral mainly on the left more so than right seem to radiate into the left shoulder. Occ felt N when pain got much worse.   Patient also had some tingling and numbness of her left upper extremity.  That is still constant.  Numbness in her medial aspect\ulnar distribution Quality: dull  Onset: Onset was from the activity of lifting.  It occurred shortly thereafter the lifting was done.  Stopping the lifting did not make the pain go away.  The pain is been constant since.  She feels tightness across her thoracic and upper back which radiates down to the left. Worse with:  lifting-something in front of her both of her hands especially.  And lifting above her head. Better with: not lifting/ resting  Radiation: down L arm into ulnar distribution Trauma: none  Red  Flags Fecal/urinary incontinence: no  Numbness/Weakness: yes, ulnar distribution left arm Fever/chills/sweats: no  Night pain: no  Unexplained weight loss: no  No relief with bedrest: no, she had relief with bedrest PMH of osteoporosis or chronic steroid use: no   Pt cont to walk 1 mile- but only once wkly now- used to b 5d/wk.    Patient Active Problem List   Diagnosis Date Noted  . Productive cough 11/22/2016  . Acute maxillary sinusitis 11/22/2016  . Status post hysterectomy with oophorectomy in 2000 02/22/2016  . Hemorrhoids 02/22/2016  . Elevated HDL 02/22/2016  . Adopted person 02/22/2016  . Allergic rhinitis 11/28/2014  . Eustachian tube dysfunction 05/31/2014  . Liver cyst 02/18/2014  . Chronic constipation 01/11/2014  . HLD (hyperlipidemia) 11/26/2013  . Hx of adenomatous colonic polyps 11/02/2012  . Vitamin D deficiency 11/02/2012  . Osteopenia 11/02/2012  . Irritable bowel syndrome (IBS) 11/02/2012  . Tinnitus, subjective 11/02/2012  . Menopausal and postmenopausal disorder 11/03/2008  . Osteoarthritis 11/03/2008    Patient Care Team    Relationship Specialty Notifications Start End  Mellody Dance, DO PCP - General Family Medicine  02/22/16   Calvert Cantor, MD Consulting Physician Ophthalmology  02/22/16   Sable Feil, MD Consulting Physician Gastroenterology  02/22/16    Comment: Rudolpho Sevin    The following portions of the patient's history were reviewed and updated as appropriate: allergies, current medications, past family history, past medical history, past social history, past surgical history and problem list.  This SmartLink is deprecated. Use AVSMEDLIST instead to display the medication list for a patient.  Review of Systems: General:   Denies fever, chills, unexplained weight loss.  Optho/Auditory:   Denies visual changes, blurred vision/LOV Respiratory:   Denies  SOB, DOE more than baseline levels.  Cardiovascular:   Denies chest pain,  palpitations, new onset peripheral edema  Gastrointestinal:   Denies nausea, vomiting, diarrhea.  Genitourinary: Denies dysuria, freq/ urgency, flank pain or discharge from genitals.  Endocrine:     Denies hot or cold intolerance, polyuria, polydipsia. Musculoskeletal:   + myalgias, denies joint swelling, + arthralgias, denies gait problems.  Skin:  Denies rash, suspicious lesions Neurological:     Denies dizziness, unexplained weakness, numbness  Psychiatric/Behavioral:   Denies mood changes, suicidal or homicidal ideations, hallucinations    Objective:  Blood pressure 110/70, pulse 98, height 5' 3.25" (1.607 m), weight 126 lb (57.2 kg). Body mass index is 22.14 kg/m.   Gen: A & O *3,  No acute distress HEENT:  [place]/AT Back Exam:  Inspection: Unremarkable  Motion: Flexion 45 deg, Extension 45 deg, Side Bending to 45 deg bilaterally,  Rotation to 45 deg bilaterally  SLR laying: Negative  XSLR laying: Negative  Palpable tenderness: None. FABER: negative. Sensory change: Gross sensation intact to all lumbar and sacral dermatomes.  Reflexes: 2+ at both patellar tendons, 2+ at achilles tendons, Babinski's downgoing.  Strength at foot  Plantar-flexion: 5/5 Dorsi-flexion: 5/5 Eversion: 5/5 Inversion: 5/5  Leg strength  Quad: 5/5 Hamstring: 5/5 Hip flexor: 5/5 Hip abductors: 5/5  Gait unremarkable.

## 2017-02-28 ENCOUNTER — Ambulatory Visit: Payer: Medicare Other | Admitting: Family Medicine

## 2017-03-06 ENCOUNTER — Ambulatory Visit (AMBULATORY_SURGERY_CENTER): Payer: Self-pay | Admitting: *Deleted

## 2017-03-06 ENCOUNTER — Other Ambulatory Visit: Payer: Self-pay

## 2017-03-06 VITALS — Ht 64.0 in | Wt 123.0 lb

## 2017-03-06 DIAGNOSIS — Z86010 Personal history of colonic polyps: Secondary | ICD-10-CM

## 2017-03-06 MED ORDER — NA SULFATE-K SULFATE-MG SULF 17.5-3.13-1.6 GM/177ML PO SOLN: ORAL | 0 refills | Status: DC

## 2017-03-06 NOTE — Progress Notes (Signed)
Patient denies any allergies to eggs or soy. Patient denies any problems with anesthesia/sedation. Patient denies any oxygen use at home. Patient denies taking any diet/weight loss medications or blood thinners. EMMI education assisgned to patient on colonoscopy, this was explained and instructions given to patient. 

## 2017-03-08 ENCOUNTER — Encounter: Payer: Self-pay | Admitting: Internal Medicine

## 2017-03-20 ENCOUNTER — Ambulatory Visit (AMBULATORY_SURGERY_CENTER): Payer: Medicare Other | Admitting: Internal Medicine

## 2017-03-20 ENCOUNTER — Encounter: Payer: Self-pay | Admitting: Internal Medicine

## 2017-03-20 VITALS — BP 103/76 | HR 86 | Temp 95.0°F | Resp 12 | Ht 64.0 in | Wt 123.0 lb

## 2017-03-20 DIAGNOSIS — D125 Benign neoplasm of sigmoid colon: Secondary | ICD-10-CM | POA: Diagnosis not present

## 2017-03-20 DIAGNOSIS — D128 Benign neoplasm of rectum: Secondary | ICD-10-CM | POA: Diagnosis not present

## 2017-03-20 DIAGNOSIS — Z86010 Personal history of colonic polyps: Secondary | ICD-10-CM | POA: Diagnosis present

## 2017-03-20 MED ORDER — SODIUM CHLORIDE 0.9 % IV SOLN: 500.0000 mL | INTRAVENOUS | Status: DC

## 2017-03-20 NOTE — Progress Notes (Signed)
Called to room to assist during endoscopic procedure.  Patient ID and intended procedure confirmed with present staff. Received instructions for my participation in the procedure from the performing physician.  

## 2017-03-20 NOTE — Progress Notes (Signed)
Report to PACU, RN, vss, BBS= Clear.  

## 2017-03-20 NOTE — Op Note (Signed)
[facility] Patient Name: Mikayla Washington Procedure Date: 03/20/2017 9:05 AM MRN: [id number] Endoscopist: Jerene Bears , MD Age: 71 Referring MD:  Date of Birth: Jun 03, 1945 Gender: Female Account #: [id number] Procedure:                Colonoscopy Indications:              Surveillance: Personal history of adenomatous                            polyps on last colonoscopy 5 years ago Medicines:                Monitored Anesthesia Care Procedure:                Pre-Anesthesia Assessment:                           - Prior to the procedure, a History and Physical                            was performed, and patient medications and                            allergies were reviewed. The patient's tolerance of                            previous anesthesia was also reviewed. The risks                            and benefits of the procedure and the sedation                            options and risks were discussed with the patient.                            All questions were answered, and informed consent                            was obtained. Prior Anticoagulants: The patient has                            taken no previous anticoagulant or antiplatelet                            agents. ASA Grade Assessment: II - A patient with                            mild systemic disease. After reviewing the risks                            and benefits, the patient was deemed in                            satisfactory condition to undergo the procedure.  After obtaining informed consent, the colonoscope                            was passed under direct vision. Throughout the                            procedure, the patient's blood pressure, pulse, and                            oxygen saturations were monitored continuously. The                            Model PCF-H190DL [id number]) scope was introduced                            through the anus and advanced  to the the cecum,                            identified by appendiceal orifice and ileocecal                            valve. The colonoscopy was performed without                            difficulty. The patient tolerated the procedure                            well. The quality of the bowel preparation was                            good. The ileocecal valve, appendiceal orifice, and                            rectum were photographed. Scope In: 9:10:17 AM Scope Out: 9:28:29 AM Scope Withdrawal Time: 0 hours 13 minutes 3 seconds  Total Procedure Duration: 0 hours 18 minutes 12 seconds  Findings:                 The digital rectal exam was normal.                           Two sessile and semi-pedunculated polyps were found                            in the sigmoid colon. The polyps were 6 to 8 mm in                            size. These polyps were removed with a cold snare.                            Resection and retrieval were complete.                           A 6 mm polyp was found in the  rectum. The polyp was                            sessile. The polyp was removed with a cold snare.                            Resection and retrieval were complete.                           The exam was otherwise without abnormality on                            direct and retroflexion views. Complications:            No immediate complications. Estimated Blood Loss:     Estimated blood loss was minimal. Impression:               - Two 6 to 8 mm polyps in the sigmoid colon,                            removed with a cold snare. Resected and retrieved.                           - One 6 mm polyp in the rectum, removed with a cold                            snare. Resected and retrieved.                           - The examination was otherwise normal on direct                            and retroflexion views. Recommendation:           - Patient has a contact number available for                             emergencies. The signs and symptoms of potential                            delayed complications were discussed with the                            patient. Return to normal activities tomorrow.                            Written discharge instructions were provided to the                            patient.                           - Resume previous diet.                           - Continue present medications.                           -  Await pathology results.                           - Repeat colonoscopy is recommended for                            surveillance. The colonoscopy date will be                            determined after pathology results from today's                            exam become available for review. Jerene Bears, MD 03/20/2017 9:30:41 AM This report has been signed electronically.

## 2017-03-20 NOTE — Patient Instructions (Signed)
YOU HAD AN ENDOSCOPIC PROCEDURE TODAY AT THE [facility]:   Refer to the procedure report that was given to you for any specific questions about what was found during the examination.  If the procedure report does not answer your questions, please call your gastroenterologist to clarify.  If you requested that your care partner not be given the details of your procedure findings, then the procedure report has been included in a sealed envelope for you to review at your convenience later.  YOU SHOULD EXPECT: Some feelings of bloating in the abdomen. Passage of more gas than usual.  Walking can help get rid of the air that was put into your GI tract during the procedure and reduce the bloating. If you had a lower endoscopy (such as a colonoscopy or flexible sigmoidoscopy) you may notice spotting of blood in your stool or on the toilet paper. If you underwent a bowel prep for your procedure, you may not have a normal bowel movement for a few days.  Please Note:  You might notice some irritation and congestion in your nose or some drainage.  This is from the oxygen used during your procedure.  There is no need for concern and it should clear up in a day or so.  SYMPTOMS TO REPORT IMMEDIATELY:   Following lower endoscopy (colonoscopy or flexible sigmoidoscopy):  Excessive amounts of blood in the stool  Significant tenderness or worsening of abdominal pains  Swelling of the abdomen that is new, acute  Fever of 100F or higher  For urgent or emergent issues, a gastroenterologist can be reached at any hour by calling [phone].   DIET:  We do recommend a small meal at first, but then you may proceed to your regular diet.  Drink plenty of fluids but you should avoid alcoholic beverages for 24 hours.  ACTIVITY:  You should plan to take it easy for the rest of today and you should NOT DRIVE or use heavy machinery until tomorrow (because of the sedation medicines used during the test).     FOLLOW UP: Our staff will call the number listed on your records the next business day following your procedure to check on you and address any questions or concerns that you may have regarding the information given to you following your procedure. If we do not reach you, we will leave a message.  However, if you are feeling well and you are not experiencing any problems, there is no need to return our call.  We will assume that you have returned to your regular daily activities without incident.  If any biopsies were taken you will be contacted by phone or by letter within the next 1-3 weeks.  Please call us at [phone] if you have not heard about the biopsies in 3 weeks.   Await for biopsy results to determine next repeat Colonoscopy screening Polyps (handout given)  SIGNATURES/CONFIDENTIALITY: You and/or your care partner have signed paperwork which will be entered into your electronic medical record.  These signatures attest to the fact that that the information above on your After Visit Summary has been reviewed and is understood.  Full responsibility of the confidentiality of this discharge information lies with you and/or your care-partner. 

## 2017-03-21 ENCOUNTER — Telehealth: Payer: Self-pay

## 2017-03-21 NOTE — Telephone Encounter (Signed)
  Follow up Call-  Call back number 03/20/2017  Post procedure Call Back phone  # [phone]  Permission to leave phone message Yes  Some recent data might be hidden     Patient questions:  Do you have a fever, pain , or abdominal swelling? No. Pain Score  0 *  Have you tolerated food without any problems? Yes.    Have you been able to return to your normal activities? Yes.    Do you have any questions about your discharge instructions: Diet   No. Medications  No. Follow up visit  No.  Do you have questions or concerns about your Care? No.  Actions: * If pain score is 4 or above: No action needed, pain <4.

## 2017-03-23 ENCOUNTER — Encounter: Payer: Self-pay | Admitting: Internal Medicine

## 2017-05-03 ENCOUNTER — Ambulatory Visit: Payer: Medicare Other | Admitting: Family Medicine

## 2017-05-03 ENCOUNTER — Encounter: Payer: Self-pay | Admitting: Family Medicine

## 2017-05-03 VITALS — BP 109/65 | HR 110 | Temp 98.1°F | Ht 63.25 in | Wt 124.2 lb

## 2017-05-03 DIAGNOSIS — R05 Cough: Secondary | ICD-10-CM | POA: Diagnosis not present

## 2017-05-03 DIAGNOSIS — R059 Cough, unspecified: Secondary | ICD-10-CM

## 2017-05-03 DIAGNOSIS — J018 Other acute sinusitis: Secondary | ICD-10-CM

## 2017-05-03 DIAGNOSIS — H6122 Impacted cerumen, left ear: Secondary | ICD-10-CM

## 2017-05-03 MED ORDER — METHYLPREDNISOLONE SODIUM SUCC 125 MG IJ SOLR
125.0000 mg | Freq: Once | INTRAMUSCULAR | Status: AC
  Administered 2017-05-03: 125 mg via INTRAMUSCULAR

## 2017-05-03 MED ORDER — AMOXICILLIN 875 MG PO TABS: 875.0000 mg | ORAL_TABLET | Freq: Two times a day (BID) | ORAL | 0 refills | Status: DC

## 2017-05-03 MED ORDER — HYDROCOD POLST-CPM POLST ER 10-8 MG/5ML PO SUER: 5.0000 mL | Freq: Two times a day (BID) | ORAL | 0 refills | Status: DC | PRN

## 2017-05-03 NOTE — Patient Instructions (Signed)

## 2017-05-03 NOTE — Progress Notes (Signed)
Acute Care Office visit  Assessment and plan:  1. Other acute sinusitis, recurrence not specified   2. Cough due to postnasal drip   3. Hearing loss due to cerumen impaction, left    1) L maxillary and frontal Sinusitis:  - Abx prescribed as listed below as her symptoms have been going on for 14 days. Instructed pt to not go into work if she develops fevers or for 24 hours after she is fever-free.   2) Cough:  - Tussionex prescribed, pt instructed to take medications as listed below.  Discussed risks and benefits of this medication. Recommended patient to take this shortly before bed as it can make her sleepy.  -Continue taking OTC medications for symptom and pain relief.  - A steroid shot will be given today to which pt agreed. Discussed side effects and risks of using steroids.  - Viral vs Allergic vs Bacterial causes for pt's symptoms reveiwed.    - Supportive care and various OTC medications discussed in addition to any prescribed. - Call or RTC if new symptoms, or if no improvement or worse over next several days.   - Will consider ABX if sx continue past 10 days and worsening.     Meds ordered this encounter  Medications  . methylPREDNISolone sodium succinate (SOLU-MEDROL) 125 mg/2 mL injection 125 mg  . amoxicillin (AMOXIL) 875 MG tablet    Sig: Take 1 tablet (875 mg total) by mouth 2 (two) times daily.    Dispense:  20 tablet    Refill:  0  . chlorpheniramine-HYDROcodone (TUSSIONEX) 10-8 MG/5ML SUER    Sig: Take 5 mLs by mouth every 12 (twelve) hours as needed for cough (cough, will cause drowsiness.).    Dispense:  200 mL    Refill:  0     Orders Placed This Encounter  Procedures  . EAR CERUMEN REMOVAL    Gross side effects, risk and benefits, and alternatives of medications discussed with patient.  Patient is aware that all medications have potential side effects and we are unable to predict every sideeffect or drug-drug interaction that may occur.  Expresses  verbal understanding and consents to current therapy plan and treatment regiment.   Education and routine counseling performed. Handouts provided.  Anticipatory guidance and routine counseling done re: condition, txmnt options and need for follow up. All questions of patient's were answered.  Return if symptoms worsen or fail to improve.  Please see AVS handed out to patient at the end of our visit for additional patient instructions/ counseling done pertaining to today's office visit.  Note: This document was partially repared using Dragon voice recognition software and may include unintentional dictation errors.  This document serves as a record of services personally performed by Mellody Dance, DO. It was created on her behalf by Mayer Masker, a trained medical scribe. The creation of this record is based on the scribe's personal observations and the provider's statements to them.   I have reviewed the above medical documentation for accuracy and completeness and I concur.  Mellody Dance 05/03/17 6:10 PM   Subjective:    Chief Complaint  Patient presents with  . Cough    cough and congestion x 2 wks    HPI:  Pt presents with Sx for 14 days.   C/o: Persistent cough with associated congestion, HA, difficulty sleeping, rhinorrhea, mild chills, and waxing/waning mildly improving left-sided facial pain.   Denies: Wheezing, SOB, fevers    For symptoms patient has tried:  Decongestants  Overall getting: The same.  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  Pt has never taken steroids before. She states hydrocodone has made her stomach have issues previously.    Patient Care Team    Relationship Specialty Notifications Start End  Mellody Dance, DO PCP - General Family Medicine  02/22/16   Calvert Cantor, MD Consulting Physician Ophthalmology  02/22/16   Sable Feil, MD Consulting Physician Gastroenterology  02/22/16    Comment: Rudolpho Sevin     Past medical history, Surgical history, Family history reviewed and noted below, Social history, Allergies, and Medications have been entered into the medical record, reviewed and changed as needed.   No Known Allergies  Review of Systems: - see above HPI for pertinent positives General:   No F, wt loss Pulm:   No DIB, pleuritic chest pain, no wheezing/SOB. Card:  No CP, palpitations Abd:  No n/v/d or pain Ext:  No inc edema from baseline   Objective:   Blood pressure 109/65, pulse (!) 110, temperature 98.1 F (36.7 C), height 5' 3.25" (1.607 m), weight 124 lb 3.2 oz (56.3 kg), SpO2 98 %. Body mass index is 21.83 kg/m. General: Well Developed, well nourished, appropriate for stated age.  Neuro: Alert and oriented x3, extra-ocular muscles intact, sensation grossly intact.  HEENT: Normocephalic, atraumatic, pupils equal round reactive to light, neck supple, no masses, no painful lymphadenopathy, TM's intact B/L once cerumen removed from the left EAC, no acute findings. Nares- patent, clear d/c, OP- clear, mild erythema, +  TTP L sided sinuses- frontal and max Skin: Warm and dry, no gross rash. Cardiac: RRR, S1 S2,  no murmurs rubs or gallops.  Respiratory: ECTA B/L and A/P, Not using accessory muscles, speaking in full sentences- unlabored. Vascular:  No gross lower ext edema, cap RF less 2 sec. Psych: No HI/SI, judgement and insight good, Euthymic mood. Full Affect.

## 2018-01-31 LAB — HM MAMMOGRAPHY

## 2018-03-28 ENCOUNTER — Encounter: Payer: Self-pay | Admitting: Family Medicine

## 2018-03-28 ENCOUNTER — Ambulatory Visit (INDEPENDENT_AMBULATORY_CARE_PROVIDER_SITE_OTHER): Payer: Medicare Other | Admitting: Family Medicine

## 2018-03-28 VITALS — BP 90/60 | HR 79 | Temp 98.4°F | Ht 63.25 in | Wt 119.6 lb

## 2018-03-28 DIAGNOSIS — Z0000 Encounter for general adult medical examination without abnormal findings: Secondary | ICD-10-CM | POA: Diagnosis not present

## 2018-03-28 DIAGNOSIS — Z13820 Encounter for screening for osteoporosis: Secondary | ICD-10-CM

## 2018-03-28 DIAGNOSIS — M810 Age-related osteoporosis without current pathological fracture: Secondary | ICD-10-CM | POA: Diagnosis not present

## 2018-03-28 DIAGNOSIS — Z139 Encounter for screening, unspecified: Secondary | ICD-10-CM | POA: Diagnosis not present

## 2018-03-28 DIAGNOSIS — Z13220 Encounter for screening for lipoid disorders: Secondary | ICD-10-CM

## 2018-03-28 NOTE — Patient Instructions (Signed)
Preventive Care for Adults, Female  A healthy lifestyle and preventive care can promote health and wellness. Preventive health guidelines for women include the following key practices.   A routine yearly physical is a good way to check with your health care provider about your health and preventive screening. It is a chance to share any concerns and updates on your health and to receive a thorough exam.   Visit your dentist for a routine exam and preventive care every 6 months. Brush your teeth twice a day and floss once a day. Good oral hygiene prevents tooth decay and gum disease.   The frequency of eye exams is based on your age, health, family medical history, use of contact lenses, and other factors. Follow your health care provider's recommendations for frequency of eye exams.   Eat a healthy diet. Foods like vegetables, fruits, whole grains, low-fat dairy products, and lean protein foods contain the nutrients you need without too many calories. Decrease your intake of foods high in solid fats, added sugars, and salt. Eat the right amount of calories for you.Get information about a proper diet from your health care provider, if necessary.   Regular physical exercise is one of the most important things you can do for your health. Most adults should get at least 150 minutes of moderate-intensity exercise (any activity that increases your heart rate and causes you to sweat) each week. In addition, most adults need muscle-strengthening exercises on 2 or more days a week.   Maintain a healthy weight. The body mass index (BMI) is a screening tool to identify possible weight problems. It provides an estimate of body fat based on height and weight. Your health care provider can find your BMI, and can help you achieve or maintain a healthy weight.For adults 20 years and older:   - A BMI below 18.5 is considered underweight.   - A BMI of 18.5 to 24.9 is normal.   - A BMI of 25 to 29.9 is  considered overweight.   - A BMI of 30 and above is considered obese.   Maintain normal blood lipids and cholesterol levels by exercising and minimizing your intake of trans and saturated fats.  Eat a balanced diet with plenty of fruit and vegetables. Blood tests for lipids and cholesterol should begin at age 20 and be repeated every 5 years minimum.  If your lipid or cholesterol levels are high, you are over 40, or you are at high risk for heart disease, you may need your cholesterol levels checked more frequently.Ongoing high lipid and cholesterol levels should be treated with medicines if diet and exercise are not working.   If you smoke, find out from your health care provider how to quit. If you do not use tobacco, do not start.   Lung cancer screening is recommended for adults aged 55-80 years who are at high risk for developing lung cancer because of a history of smoking. A yearly low-dose CT scan of the lungs is recommended for people who have at least a 30-pack-year history of smoking and are a current smoker or have quit within the past 15 years. A pack year of smoking is smoking an average of 1 pack of cigarettes a day for 1 year (for example: 1 pack a day for 30 years or 2 packs a day for 15 years). Yearly screening should continue until the smoker has stopped smoking for at least 15 years. Yearly screening should be stopped for people who develop a   health problem that would prevent them from having lung cancer treatment.   If you are pregnant, do not drink alcohol. If you are breastfeeding, be very cautious about drinking alcohol. If you are not pregnant and choose to drink alcohol, do not have more than 1 drink per day. One drink is considered to be 12 ounces (355 mL) of beer, 5 ounces (148 mL) of wine, or 1.5 ounces (44 mL) of liquor.   Avoid use of street drugs. Do not share needles with anyone. Ask for help if you need support or instructions about stopping the use of  drugs.   High blood pressure causes heart disease and increases the risk of stroke. Your blood pressure should be checked at least yearly.  Ongoing high blood pressure should be treated with medicines if weight loss and exercise do not work.   If you are 69-55 years old, ask your health care provider if you should take aspirin to prevent strokes.   Diabetes screening involves taking a blood sample to check your fasting blood sugar level. This should be done once every 3 years, after age 38, if you are within normal weight and without risk factors for diabetes. Testing should be considered at a younger age or be carried out more frequently if you are overweight and have at least 1 risk factor for diabetes.   Breast cancer screening is essential preventive care for women. You should practice "breast self-awareness."  This means understanding the normal appearance and feel of your breasts and may include breast self-examination.  Any changes detected, no matter how small, should be reported to a health care provider.  Women in their 80s and 30s should have a clinical breast exam (CBE) by a health care provider as part of a regular health exam every 1 to 3 years.  After age 66, women should have a CBE every year.  Starting at age 1, women should consider having a mammogram (breast X-ray test) every year.  Women who have a family history of breast cancer should talk to their health care provider about genetic screening.  Women at a high risk of breast cancer should talk to their health care providers about having an MRI and a mammogram every year.   -Breast cancer gene (BRCA)-related cancer risk assessment is recommended for women who have family members with BRCA-related cancers. BRCA-related cancers include breast, ovarian, tubal, and peritoneal cancers. Having family members with these cancers may be associated with an increased risk for harmful changes (mutations) in the breast cancer genes BRCA1 and  BRCA2. Results of the assessment will determine the need for genetic counseling and BRCA1 and BRCA2 testing.   The Pap test is a screening test for cervical cancer. A Pap test can show cell changes on the cervix that might become cervical cancer if left untreated. A Pap test is a procedure in which cells are obtained and examined from the lower end of the uterus (cervix).   - Women should have a Pap test starting at age 57.   - Between ages 90 and 70, Pap tests should be repeated every 2 years.   - Beginning at age 63, you should have a Pap test every 3 years as long as the past 3 Pap tests have been normal.   - Some women have medical problems that increase the chance of getting cervical cancer. Talk to your health care provider about these problems. It is especially important to talk to your health care provider if a  new problem develops soon after your last Pap test. In these cases, your health care provider may recommend more frequent screening and Pap tests.   - The above recommendations are the same for women who have or have not gotten the vaccine for human papillomavirus (HPV).   - If you had a hysterectomy for a problem that was not cancer or a condition that could lead to cancer, then you no longer need Pap tests. Even if you no longer need a Pap test, a regular exam is a good idea to make sure no other problems are starting.   - If you are between ages 36 and 66 years, and you have had normal Pap tests going back 10 years, you no longer need Pap tests. Even if you no longer need a Pap test, a regular exam is a good idea to make sure no other problems are starting.   - If you have had past treatment for cervical cancer or a condition that could lead to cancer, you need Pap tests and screening for cancer for at least 20 years after your treatment.   - If Pap tests have been discontinued, risk factors (such as a new sexual partner) need to be reassessed to determine if screening should  be resumed.   - The HPV test is an additional test that may be used for cervical cancer screening. The HPV test looks for the virus that can cause the cell changes on the cervix. The cells collected during the Pap test can be tested for HPV. The HPV test could be used to screen women aged 70 years and older, and should be used in women of any age who have unclear Pap test results. After the age of 67, women should have HPV testing at the same frequency as a Pap test.   Colorectal cancer can be detected and often prevented. Most routine colorectal cancer screening begins at the age of 57 years and continues through age 26 years. However, your health care provider may recommend screening at an earlier age if you have risk factors for colon cancer. On a yearly basis, your health care provider may provide home test kits to check for hidden blood in the stool.  Use of a small camera at the end of a tube, to directly examine the colon (sigmoidoscopy or colonoscopy), can detect the earliest forms of colorectal cancer. Talk to your health care provider about this at age 23, when routine screening begins. Direct exam of the colon should be repeated every 5 -10 years through age 49 years, unless early forms of pre-cancerous polyps or small growths are found.   People who are at an increased risk for hepatitis B should be screened for this virus. You are considered at high risk for hepatitis B if:  -You were born in a country where hepatitis B occurs often. Talk with your health care provider about which countries are considered high risk.  - Your parents were born in a high-risk country and you have not received a shot to protect against hepatitis B (hepatitis B vaccine).  - You have HIV or AIDS.  - You use needles to inject street drugs.  - You live with, or have sex with, someone who has Hepatitis B.  - You get hemodialysis treatment.  - You take certain medicines for conditions like cancer, organ  transplantation, and autoimmune conditions.   Hepatitis C blood testing is recommended for all people born from [birth year] through 1965 and any individual  with known risks for hepatitis C.   Practice safe sex. Use condoms and avoid high-risk sexual practices to reduce the spread of sexually transmitted infections (STIs). STIs include gonorrhea, chlamydia, syphilis, trichomonas, herpes, HPV, and human immunodeficiency virus (HIV). Herpes, HIV, and HPV are viral illnesses that have no cure. They can result in disability, cancer, and death. Sexually active women aged 25 years and younger should be checked for chlamydia. Older women with new or multiple partners should also be tested for chlamydia. Testing for other STIs is recommended if you are sexually active and at increased risk.   Osteoporosis is a disease in which the bones lose minerals and strength with aging. This can result in serious bone fractures or breaks. The risk of osteoporosis can be identified using a bone density scan. Women ages 65 years and over and women at risk for fractures or osteoporosis should discuss screening with their health care providers. Ask your health care provider whether you should take a calcium supplement or vitamin D to There are also several preventive steps women can take to avoid osteoporosis and resulting fractures or to keep osteoporosis from worsening. -->Recommendations include:  Eat a balanced diet high in fruits, vegetables, calcium, and vitamins.  Get enough calcium. The recommended total intake of is 1,200 mg daily; for best absorption, if taking supplements, divide doses into 250-500 mg doses throughout the day. Of the two types of calcium, calcium carbonate is best absorbed when taken with food but calcium citrate can be taken on an empty stomach.  Get enough vitamin D. NAMS and the National Osteoporosis Foundation recommend at least 1,000 IU per day for women age 50 and over who are at risk of vitamin D  deficiency. Vitamin D deficiency can be caused by inadequate sun exposure (for example, those who live in northern latitudes).  Avoid alcohol and smoking. Heavy alcohol intake (more than 7 drinks per week) increases the risk of falls and hip fracture and women smokers tend to lose bone more rapidly and have lower bone mass than nonsmokers. Stopping smoking is one of the most important changes women can make to improve their health and decrease risk for disease.  Be physically active every day. Weight-bearing exercise (for example, fast walking, hiking, jogging, and weight training) may strengthen bones or slow the rate of bone loss that comes with aging. Balancing and muscle-strengthening exercises can reduce the risk of falling and fracture.  Consider therapeutic medications. Currently, several types of effective drugs are available. Healthcare providers can recommend the type most appropriate for each woman.  Eliminate environmental factors that may contribute to accidents. Falls cause nearly 90% of all osteoporotic fractures, so reducing this risk is an important bone-health strategy. Measures include ample lighting, removing obstructions to walking, using nonskid rugs on floors, and placing mats and/or grab bars in showers.  Be aware of medication side effects. Some common medicines make bones weaker. These include a type of steroid drug called glucocorticoids used for arthritis and asthma, some antiseizure drugs, certain sleeping pills, treatments for endometriosis, and some cancer drugs. An overactive thyroid gland or using too much thyroid hormone for an underactive thyroid can also be a problem. If you are taking these medicines, talk to your doctor about what you can do to help protect your bones.reduce the rate of osteoporosis.    Menopause can be associated with physical symptoms and risks. Hormone replacement therapy is available to decrease symptoms and risks. You should talk to your  health care provider   about whether hormone replacement therapy is right for you.   Use sunscreen. Apply sunscreen liberally and repeatedly throughout the day. You should seek shade when your shadow is shorter than you. Protect yourself by wearing long sleeves, pants, a wide-brimmed hat, and sunglasses year round, whenever you are outdoors.   Once a month, do a whole body skin exam, using a mirror to look at the skin on your back. Tell your health care provider of new moles, moles that have irregular borders, moles that are larger than a pencil eraser, or moles that have changed in shape or color.   -Stay current with required vaccines (immunizations).   Influenza vaccine. All adults should be immunized every year.  Tetanus, diphtheria, and acellular pertussis (Td, Tdap) vaccine. Pregnant women should receive 1 dose of Tdap vaccine during each pregnancy. The dose should be obtained regardless of the length of time since the last dose. Immunization is preferred during the 27th 36th week of gestation. An adult who has not previously received Tdap or who does not know her vaccine status should receive 1 dose of Tdap. This initial dose should be followed by tetanus and diphtheria toxoids (Td) booster doses every 10 years. Adults with an unknown or incomplete history of completing a 3-dose immunization series with Td-containing vaccines should begin or complete a primary immunization series including a Tdap dose. Adults should receive a Td booster every 10 years.  Varicella vaccine. An adult without evidence of immunity to varicella should receive 2 doses or a second dose if she has previously received 1 dose. Pregnant females who do not have evidence of immunity should receive the first dose after pregnancy. This first dose should be obtained before leaving the health care facility. The second dose should be obtained 4 8 weeks after the first dose.  Human papillomavirus (HPV) vaccine. Females aged 13 26  years who have not received the vaccine previously should obtain the 3-dose series. The vaccine is not recommended for use in pregnant females. However, pregnancy testing is not needed before receiving a dose. If a female is found to be pregnant after receiving a dose, no treatment is needed. In that case, the remaining doses should be delayed until after the pregnancy. Immunization is recommended for any person with an immunocompromised condition through the age of 26 years if she did not get any or all doses earlier. During the 3-dose series, the second dose should be obtained 4 8 weeks after the first dose. The third dose should be obtained 24 weeks after the first dose and 16 weeks after the second dose.  Zoster vaccine. One dose is recommended for adults aged 60 years or older unless certain conditions are present.  Measles, mumps, and rubella (MMR) vaccine. Adults born before 1957 generally are considered immune to measles and mumps. Adults born in 1957 or later should have 1 or more doses of MMR vaccine unless there is a contraindication to the vaccine or there is laboratory evidence of immunity to each of the three diseases. A routine second dose of MMR vaccine should be obtained at least 28 days after the first dose for students attending postsecondary schools, health care workers, or international travelers. People who received inactivated measles vaccine or an unknown type of measles vaccine during 1963 1967 should receive 2 doses of MMR vaccine. People who received inactivated mumps vaccine or an unknown type of mumps vaccine before 1979 and are at high risk for mumps infection should consider immunization with 2 doses of   MMR vaccine. For females of childbearing age, rubella immunity should be determined. If there is no evidence of immunity, females who are not pregnant should be vaccinated. If there is no evidence of immunity, females who are pregnant should delay immunization until after pregnancy.  Unvaccinated health care workers born before [birth year] who lack laboratory evidence of measles, mumps, or rubella immunity or laboratory confirmation of disease should consider measles and mumps immunization with 2 doses of MMR vaccine or rubella immunization with 1 dose of MMR vaccine.  Pneumococcal 13-valent conjugate (PCV13) vaccine. When indicated, a person who is uncertain of her immunization history and has no record of immunization should receive the PCV13 vaccine. An adult aged 54 years or older who has certain medical conditions and has not been previously immunized should receive 1 dose of PCV13 vaccine. This PCV13 should be followed with a dose of pneumococcal polysaccharide (PPSV23) vaccine. The PPSV23 vaccine dose should be obtained at least 8 weeks after the dose of PCV13 vaccine. An adult aged 58 years or older who has certain medical conditions and previously received 1 or more doses of PPSV23 vaccine should receive 1 dose of PCV13. The PCV13 vaccine dose should be obtained 1 or more years after the last PPSV23 vaccine dose.  Pneumococcal polysaccharide (PPSV23) vaccine. When PCV13 is also indicated, PCV13 should be obtained first. All adults aged 58 years and older should be immunized. An adult younger than age 65 years who has certain medical conditions should be immunized. Any person who resides in a nursing home or long-term care facility should be immunized. An adult smoker should be immunized. People with an immunocompromised condition and certain other conditions should receive both PCV13 and PPSV23 vaccines. People with human immunodeficiency virus (HIV) infection should be immunized as soon as possible after diagnosis. Immunization during chemotherapy or radiation therapy should be avoided. Routine use of PPSV23 vaccine is not recommended for American Indians, [place] Natives, or people younger than 65 years unless there are medical conditions that require PPSV23 vaccine. When indicated,  people who have unknown immunization and have no record of immunization should receive PPSV23 vaccine. One-time revaccination 5 years after the first dose of PPSV23 is recommended for people aged 70 64 years who have chronic kidney failure, nephrotic syndrome, asplenia, or immunocompromised conditions. People who received 1 2 doses of PPSV23 before age 32 years should receive another dose of PPSV23 vaccine at age 96 years or later if at least 5 years have passed since the previous dose. Doses of PPSV23 are not needed for people immunized with PPSV23 at or after age 55 years.  Meningococcal vaccine. Adults with asplenia or persistent complement component deficiencies should receive 2 doses of quadrivalent meningococcal conjugate (MenACWY-D) vaccine. The doses should be obtained at least 2 months apart. Microbiologists working with certain meningococcal bacteria, [employer] recruits, people at risk during an outbreak, and people who travel to or live in countries with a high rate of meningitis should be immunized. A first-year college student up through age 58 years who is living in a residence hall should receive a dose if she did not receive a dose on or after her 16th birthday. Adults who have certain high-risk conditions should receive one or more doses of vaccine.  Hepatitis A vaccine. Adults who wish to be protected from this disease, have certain high-risk conditions, work with hepatitis A-infected animals, work in hepatitis A research labs, or travel to or work in countries with a high rate of hepatitis A should be  immunized. Adults who were previously unvaccinated and who anticipate close contact with an international adoptee during the first 60 days after arrival in the [place] from a country with a high rate of hepatitis A should be immunized.  Hepatitis B vaccine.  Adults who wish to be protected from this disease, have certain high-risk conditions, may be exposed to blood or other infectious  body fluids, are household contacts or sex partners of hepatitis B positive people, are clients or workers in certain care facilities, or travel to or work in countries with a high rate of hepatitis B should be immunized.  Haemophilus influenzae type b (Hib) vaccine. A previously unvaccinated person with asplenia or sickle cell disease or having a scheduled splenectomy should receive 1 dose of Hib vaccine. Regardless of previous immunization, a recipient of a hematopoietic stem cell transplant should receive a 3-dose series 6 12 months after her successful transplant. Hib vaccine is not recommended for adults with HIV infection.  Preventive Services / Frequency Ages 6 to 39years  Blood pressure check.** / Every 1 to 2 years.  Lipid and cholesterol check.** / Every 5 years beginning at age 39.  Clinical breast exam.** / Every 3 years for women in their 61s and 62s.  BRCA-related cancer risk assessment.** / For women who have family members with a BRCA-related cancer (breast, ovarian, tubal, or peritoneal cancers).  Pap test.** / Every 2 years from ages 47 through 85. Every 3 years starting at age 34 through age 12 or 74 with a history of 3 consecutive normal Pap tests.  HPV screening.** / Every 3 years from ages 46 through ages 43 to 54 with a history of 3 consecutive normal Pap tests.  Hepatitis C blood test.** / For any individual with known risks for hepatitis C.  Skin self-exam. / Monthly.  Influenza vaccine. / Every year.  Tetanus, diphtheria, and acellular pertussis (Tdap, Td) vaccine.** / Consult your health care provider. Pregnant women should receive 1 dose of Tdap vaccine during each pregnancy. 1 dose of Td every 10 years.  Varicella vaccine.** / Consult your health care provider. Pregnant females who do not have evidence of immunity should receive the first dose after pregnancy.  HPV vaccine. / 3 doses over 6 months, if 64 and younger. The vaccine is not recommended for use in  pregnant females. However, pregnancy testing is not needed before receiving a dose.  Measles, mumps, rubella (MMR) vaccine.** / You need at least 1 dose of MMR if you were born in 1957 or later. You may also need a 2nd dose. For females of childbearing age, rubella immunity should be determined. If there is no evidence of immunity, females who are not pregnant should be vaccinated. If there is no evidence of immunity, females who are pregnant should delay immunization until after pregnancy.  Pneumococcal 13-valent conjugate (PCV13) vaccine.** / Consult your health care provider.  Pneumococcal polysaccharide (PPSV23) vaccine.** / 1 to 2 doses if you smoke cigarettes or if you have certain conditions.  Meningococcal vaccine.** / 1 dose if you are age 71 to 37 years and a Market researcher living in a residence hall, or have one of several medical conditions, you need to get vaccinated against meningococcal disease. You may also need additional booster doses.  Hepatitis A vaccine.** / Consult your health care provider.  Hepatitis B vaccine.** / Consult your health care provider.  Haemophilus influenzae type b (Hib) vaccine.** / Consult your health care provider.  Ages 55 to 64years  Blood pressure check.** / Every 1 to 2 years.  Lipid and cholesterol check.** / Every 5 years beginning at age 20 years.  Lung cancer screening. / Every year if you are aged 55 80 years and have a 30-pack-year history of smoking and currently smoke or have quit within the past 15 years. Yearly screening is stopped once you have quit smoking for at least 15 years or develop a health problem that would prevent you from having lung cancer treatment.  Clinical breast exam.** / Every year after age 40 years.  BRCA-related cancer risk assessment.** / For women who have family members with a BRCA-related cancer (breast, ovarian, tubal, or peritoneal cancers).  Mammogram.** / Every year beginning at age 40  years and continuing for as long as you are in good health. Consult with your health care provider.  Pap test.** / Every 3 years starting at age 30 years through age 65 or 70 years with a history of 3 consecutive normal Pap tests.  HPV screening.** / Every 3 years from ages 30 years through ages 65 to 70 years with a history of 3 consecutive normal Pap tests.  Fecal occult blood test (FOBT) of stool. / Every year beginning at age 50 years and continuing until age 75 years. You may not need to do this test if you get a colonoscopy every 10 years.  Flexible sigmoidoscopy or colonoscopy.** / Every 5 years for a flexible sigmoidoscopy or every 10 years for a colonoscopy beginning at age 50 years and continuing until age 75 years.  Hepatitis C blood test.** / For all people born from 1945 through 1965 and any individual with known risks for hepatitis C.  Skin self-exam. / Monthly.  Influenza vaccine. / Every year.  Tetanus, diphtheria, and acellular pertussis (Tdap/Td) vaccine.** / Consult your health care provider. Pregnant women should receive 1 dose of Tdap vaccine during each pregnancy. 1 dose of Td every 10 years.  Varicella vaccine.** / Consult your health care provider. Pregnant females who do not have evidence of immunity should receive the first dose after pregnancy.  Zoster vaccine.** / 1 dose for adults aged 60 years or older.  Measles, mumps, rubella (MMR) vaccine.** / You need at least 1 dose of MMR if you were born in 1957 or later. You may also need a 2nd dose. For females of childbearing age, rubella immunity should be determined. If there is no evidence of immunity, females who are not pregnant should be vaccinated. If there is no evidence of immunity, females who are pregnant should delay immunization until after pregnancy.  Pneumococcal 13-valent conjugate (PCV13) vaccine.** / Consult your health care provider.  Pneumococcal polysaccharide (PPSV23) vaccine.** / 1 to 2 doses if  you smoke cigarettes or if you have certain conditions.  Meningococcal vaccine.** / Consult your health care provider.  Hepatitis A vaccine.** / Consult your health care provider.  Hepatitis B vaccine.** / Consult your health care provider.  Haemophilus influenzae type b (Hib) vaccine.** / Consult your health care provider.  Ages 65 years and over  Blood pressure check.** / Every 1 to 2 years.  Lipid and cholesterol check.** / Every 5 years beginning at age 20 years.  Lung cancer screening. / Every year if you are aged 55 80 years and have a 30-pack-year history of smoking and currently smoke or have quit within the past 15 years. Yearly screening is stopped once you have quit smoking for at least 15 years or develop a health problem that   would prevent you from having lung cancer treatment.  Clinical breast exam.** / Every year after age 103 years.  BRCA-related cancer risk assessment.** / For women who have family members with a BRCA-related cancer (breast, ovarian, tubal, or peritoneal cancers).  Mammogram.** / Every year beginning at age 36 years and continuing for as long as you are in good health. Consult with your health care provider.  Pap test.** / Every 3 years starting at age 5 years through age 85 or 10 years with 3 consecutive normal Pap tests. Testing can be stopped between 65 and 70 years with 3 consecutive normal Pap tests and no abnormal Pap or HPV tests in the past 10 years.  HPV screening.** / Every 3 years from ages 93 years through ages 70 or 45 years with a history of 3 consecutive normal Pap tests. Testing can be stopped between 65 and 70 years with 3 consecutive normal Pap tests and no abnormal Pap or HPV tests in the past 10 years.  Fecal occult blood test (FOBT) of stool. / Every year beginning at age 8 years and continuing until age 45 years. You may not need to do this test if you get a colonoscopy every 10 years.  Flexible sigmoidoscopy or colonoscopy.** /  Every 5 years for a flexible sigmoidoscopy or every 10 years for a colonoscopy beginning at age 69 years and continuing until age 68 years.  Hepatitis C blood test.** / For all people born from [birth year] through 1965 and any individual with known risks for hepatitis C.  Osteoporosis screening.** / A one-time screening for women ages 7 years and over and women at risk for fractures or osteoporosis.  Skin self-exam. / Monthly.  Influenza vaccine. / Every year.  Tetanus, diphtheria, and acellular pertussis (Tdap/Td) vaccine.** / 1 dose of Td every 10 years.  Varicella vaccine.** / Consult your health care provider.  Zoster vaccine.** / 1 dose for adults aged 5 years or older.  Pneumococcal 13-valent conjugate (PCV13) vaccine.** / Consult your health care provider.  Pneumococcal polysaccharide (PPSV23) vaccine.** / 1 dose for all adults aged 74 years and older.  Meningococcal vaccine.** / Consult your health care provider.  Hepatitis A vaccine.** / Consult your health care provider.  Hepatitis B vaccine.** / Consult your health care provider.  Haemophilus influenzae type b (Hib) vaccine.** / Consult your health care provider. ** Family history and personal history of risk and conditions may change your health care provider's recommendations. Document Released: 05/31/2001 Document Revised: 01/23/2013  [facility] Patient Information 2014 [place], [place].   EXERCISE AND DIET:  We recommended that you start or continue a regular exercise program for good health. Regular exercise means any activity that makes your heart beat faster and makes you sweat.  We recommend exercising at least 30 minutes per day at least 3 days a week, preferably 5.  We also recommend a diet low in fat and sugar / carbohydrates.  Inactivity, poor dietary choices and obesity can cause diabetes, heart attack, stroke, and kidney damage, among others.     ALCOHOL AND SMOKING:  Women should limit their alcohol intake to no  more than 7 drinks/beers/glasses of wine (combined, not each!) per week. Moderation of alcohol intake to this level decreases your risk of breast cancer and liver damage.  ( And of course, no recreational drugs are part of a healthy lifestyle.)  Also, you should not be smoking at all or even being exposed to second hand smoke. Most people know smoking can  cause cancer, and various heart and lung diseases, but did you know it also contributes to weakening of your bones?  Aging of your skin?  Yellowing of your teeth and nails?   CALCIUM AND VITAMIN D:  Adequate intake of calcium and Vitamin D are recommended.  The recommendations for exact amounts of these supplements seem to change often, but generally speaking 600 mg of calcium (either carbonate or citrate) and 800 units of Vitamin D per day seems prudent. Certain women may benefit from higher intake of Vitamin D.  If you are among these women, your doctor will have told you during your visit.     PAP SMEARS:  Pap smears, to check for cervical cancer or precancers,  have traditionally been done yearly, although recent scientific advances have shown that most women can have pap smears less often.  However, every woman still should have a physical exam from her gynecologist or primary care physician every year. It will include a breast check, inspection of the vulva and vagina to check for abnormal growths or skin changes, a visual exam of the cervix, and then an exam to evaluate the size and shape of the uterus and ovaries.  And after 72 years of age, a rectal exam is indicated to check for rectal cancers. We will also provide age appropriate advice regarding health maintenance, like when you should have certain vaccines, screening for sexually transmitted diseases, bone density testing, colonoscopy, mammograms, etc.    MAMMOGRAMS:  All women over 71 years old should have a yearly mammogram. Many facilities now offer a "3D" mammogram, which may cost  around $50 extra out of pocket. If possible,  we recommend you accept the option to have the 3D mammogram performed.  It both reduces the number of women who will be called back for extra views which then turn out to be normal, and it is better than the routine mammogram at detecting truly abnormal areas.     COLONOSCOPY:  Colonoscopy to screen for colon cancer is recommended for all women at age 52.  We know, you hate the idea of the prep.  We agree, BUT, having colon cancer and not knowing it is worse!!  Colon cancer so often starts as a polyp that can be seen and removed at colonscopy, which can quite literally save your life!  And if your first colonoscopy is normal and you have no family history of colon cancer, most women don't have to have it again for 10 years.  Once every ten years, you can do something that may end up saving your life, right?  We will be happy to help you get it scheduled when you are ready.  Be sure to check your insurance coverage so you understand how much it will cost.  It may be covered as a preventative service at no cost, but you should check your particular policy.
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
## 2018-03-29 LAB — LIPID PANEL
CHOLESTEROL TOTAL: 164 mg/dL (ref 100–199)
Chol/HDL Ratio: 2.6 ratio (ref 0.0–4.4)
HDL: 62 mg/dL (ref >39)
LDL Calculated: 91 mg/dL (ref 0–99)
Triglycerides: 56 mg/dL (ref 0–149)
VLDL Cholesterol Cal: 11 mg/dL (ref 5–40)

## 2018-03-29 LAB — CBC WITH DIFFERENTIAL/PLATELET
Basophils Absolute: 0 10*3/uL (ref 0.0–0.2)
Basos: 1 %
EOS (ABSOLUTE): 0.2 10*3/uL (ref 0.0–0.4)
Eos: 3 %
HEMATOCRIT: 38.9 % (ref 34.0–46.6)
HEMOGLOBIN: 12.9 g/dL (ref 11.1–15.9)
IMMATURE GRANULOCYTES: 0 %
Immature Grans (Abs): 0 10*3/uL (ref 0.0–0.1)
LYMPHS ABS: 1.8 10*3/uL (ref 0.7–3.1)
LYMPHS: 35 %
MCH: 29.6 pg (ref 26.6–33.0)
MCHC: 33.2 g/dL (ref 31.5–35.7)
MCV: 89 fL (ref 79–97)
MONOCYTES: 8 %
Monocytes Absolute: 0.4 10*3/uL (ref 0.1–0.9)
NEUTROS ABS: 2.8 10*3/uL (ref 1.4–7.0)
Neutrophils: 53 %
PLATELETS: 206 10*3/uL (ref 150–450)
RBC: 4.36 x10E6/uL (ref 3.77–5.28)
RDW: 13.4 % (ref 12.3–15.4)
WBC: 5.2 10*3/uL (ref 3.4–10.8)

## 2018-03-29 LAB — T4, FREE: Free T4: 1.34 ng/dL (ref 0.82–1.77)

## 2018-03-29 LAB — COMPREHENSIVE METABOLIC PANEL
ALK PHOS: 91 IU/L (ref 39–117)
ALT: 10 IU/L (ref 0–32)
AST: 16 IU/L (ref 0–40)
Albumin/Globulin Ratio: 2.3 — ABNORMAL HIGH (ref 1.2–2.2)
Albumin: 4.3 g/dL (ref 3.5–4.8)
BUN/Creatinine Ratio: 24 (ref 12–28)
BUN: 16 mg/dL (ref 8–27)
Bilirubin Total: 0.4 mg/dL (ref 0.0–1.2)
CO2: 25 mmol/L (ref 20–29)
Calcium: 9.3 mg/dL (ref 8.7–10.3)
Chloride: 103 mmol/L (ref 96–106)
Creatinine, Ser: 0.67 mg/dL (ref 0.57–1.00)
GFR, EST AFRICAN AMERICAN: 102 mL/min/{1.73_m2} (ref >59)
GFR, EST NON AFRICAN AMERICAN: 88 mL/min/{1.73_m2} (ref >59)
Globulin, Total: 1.9 g/dL (ref 1.5–4.5)
Glucose: 85 mg/dL (ref 65–99)
POTASSIUM: 4.4 mmol/L (ref 3.5–5.2)
Sodium: 140 mmol/L (ref 134–144)
TOTAL PROTEIN: 6.2 g/dL (ref 6.0–8.5)

## 2018-03-29 LAB — VITAMIN B12: Vitamin B-12: 270 pg/mL (ref 232–1245)

## 2018-03-29 LAB — TSH: TSH: 2.28 u[IU]/mL (ref 0.450–4.500)

## 2018-03-29 LAB — HEMOGLOBIN A1C
Est. average glucose Bld gHb Est-mCnc: 120 mg/dL
Hgb A1c MFr Bld: 5.8 % — ABNORMAL HIGH (ref 4.8–5.6)

## 2018-03-29 LAB — VITAMIN D 25 HYDROXY (VIT D DEFICIENCY, FRACTURES): VIT D 25 HYDROXY: 27.3 ng/mL — AB (ref 30.0–100.0)

## 2018-05-14 ENCOUNTER — Encounter: Payer: Self-pay | Admitting: Adult Health

## 2018-05-14 ENCOUNTER — Ambulatory Visit: Payer: Medicare Other | Admitting: Adult Health

## 2018-05-14 VITALS — BP 118/71 | HR 80 | Temp 98.4°F | Resp 18 | Wt 123.0 lb

## 2018-05-14 DIAGNOSIS — R05 Cough: Secondary | ICD-10-CM | POA: Diagnosis not present

## 2018-05-14 DIAGNOSIS — R059 Cough, unspecified: Secondary | ICD-10-CM | POA: Insufficient documentation

## 2018-05-14 MED ORDER — FLUTICASONE PROPIONATE 50 MCG/ACT NA SUSP: 2.0000 | Freq: Every day | NASAL | 1 refills | Status: DC

## 2018-05-14 MED ORDER — BENZONATATE 100 MG PO CAPS: 100.0000 mg | ORAL_CAPSULE | Freq: Two times a day (BID) | ORAL | 0 refills | Status: DC | PRN

## 2018-05-14 NOTE — Patient Instructions (Signed)
Cough, Adult  Coughing is a reflex that clears your throat and your airways. Coughing helps to heal and protect your lungs. It is normal to cough occasionally, but a cough that happens with other symptoms or lasts a long time may be a sign of a condition that needs treatment. A cough may last only 2-3 weeks (acute), or it may last longer than 8 weeks (chronic). What are the causes? Coughing is commonly caused by:  Breathing in substances that irritate your lungs.  A viral or bacterial respiratory infection.  Allergies.  Asthma.  Postnasal drip.  Smoking.  Acid backing up from the stomach into the esophagus (gastroesophageal reflux).  Certain medicines.  Chronic lung problems, including COPD (or rarely, lung cancer).  Other medical conditions such as heart failure. Follow these instructions at home: Pay attention to any changes in your symptoms. Take these actions to help with your discomfort:  Take medicines only as told by your health care provider. ? If you were prescribed an antibiotic medicine, take it as told by your health care provider. Do not stop taking the antibiotic even if you start to feel better. ? Talk with your health care provider before you take a cough suppressant medicine.  Drink enough fluid to keep your urine clear or pale yellow.  If the air is dry, use a cold steam vaporizer or humidifier in your bedroom or your home to help loosen secretions.  Avoid anything that causes you to cough at work or at home.  If your cough is worse at night, try sleeping in a semi-upright position.  Avoid cigarette smoke. If you smoke, quit smoking. If you need help quitting, ask your health care provider.  Avoid caffeine.  Avoid alcohol.  Rest as needed. Contact a health care provider if:  You have new symptoms.  You cough up pus.  Your cough does not get better after 2-3 weeks, or your cough gets worse.  You cannot control your cough with suppressant  medicines and you are losing sleep.  You develop pain that is getting worse or pain that is not controlled with pain medicines.  You have a fever.  You have unexplained weight loss.  You have night sweats. Get help right away if:  You cough up blood.  You have difficulty breathing.  Your heartbeat is very fast. This information is not intended to replace advice given to you by your health care provider. Make sure you discuss any questions you have with your health care provider. Document Released: 10/01/2010 Document Revised: 09/10/2015 Document Reviewed: 06/11/2014 Elsevier Interactive Patient Education  2019 [employer].  Please use Tessalon and Flonase as needed. Continue to push fluids and rest as often as possible. If you are still experiencing symptoms one week from today, please call clinic and we will send in Azithromycin. FEEL BETTER!

## 2018-05-14 NOTE — Progress Notes (Signed)
Subjective:    Patient ID: Mikayla Washington, female    DOB: 07-Feb-1946, 73 y.o.   MRN: [id number]  HPI:  Mikayla Washington presents with non-productive cough, clear nasal drainage that started 4 days ago.  She also reports high fever at onset of sx's, highest 102 f She has been afebrile the last 3 days. She reports the cough/congestion improving each day, and states "I am just here to get checked out b/c of my age". 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 Cough in adult 05/14/2018  . Productive cough 11/22/2016  . Acute maxillary sinusitis 11/22/2016  . Status post hysterectomy with oophorectomy in 2000 02/22/2016  . Hemorrhoids 02/22/2016  . Elevated HDL 02/22/2016  . Adopted person 02/22/2016  . Allergic rhinitis 11/28/2014  . Eustachian tube dysfunction 05/31/2014  . Liver cyst 02/18/2014  . Chronic constipation 01/11/2014  . HLD (hyperlipidemia) 11/26/2013  . Hx of adenomatous colonic polyps 11/02/2012  . Vitamin D deficiency 11/02/2012  . Osteopenia 11/02/2012  . Irritable bowel syndrome (IBS) 11/02/2012  . Tinnitus, subjective 11/02/2012  . Menopausal and postmenopausal disorder 11/03/2008  . Osteoarthritis 11/03/2008     Past Medical History:  Diagnosis Date  . Adenomatous colon polyp 2013   Mikayla Washington  . Breast disease    fibrocystic  . DJD (degenerative joint  disease)      Past Surgical History:  Procedure Laterality Date  . ABDOMINAL HYSTERECTOMY    . BREAST LUMPECTOMY     X 3  . COLONOSCOPY  2002    Mikayla Washington, negative  . colonoscopy with polypectomy  2013   adenomatous polyp  . TONSILLECTOMY    . TOTAL ABDOMINAL HYSTERECTOMY W/ BILATERAL SALPINGOOPHORECTOMY  2001    benign ovarian tumor (BSO also)     Family History  Adopted: Yes     Social History   Substance and Sexual Activity  Drug Use No     Social History   Substance and Sexual Activity  Alcohol Use No   Comment: rare, 1 x per year or at celebrations      Social History   Tobacco Use  Smoking Status Never Smoker  Smokeless Tobacco Never Used     Outpatient Encounter Medications as of 05/14/2018  Medication Sig  . Calcium-Magnesium-Vitamin D (CALCIUM 1200+D3 PO) Take 1 tablet by mouth daily.  . Multiple Vitamins-Minerals (CENTRUM SILVER PO) Take 1 tablet by mouth daily.  . benzonatate (TESSALON) 100 MG capsule Take 1 capsule (100 mg total) by mouth 2 (two) times daily as needed for cough.  . fluticasone (FLONASE) 50 MCG/ACT nasal spray Place 2 sprays into both nostrils daily.   No facility-administered encounter medications on file as of 05/14/2018.     Allergies: Patient has no known allergies.  Body mass index is 21.62 kg/m.  Blood pressure 118/71, pulse 80, temperature 98.4 F (36.9 C), temperature source Oral, resp. rate 18, weight 123 lb (55.8 kg), SpO2  97 %.     Review of Systems  Constitutional: Positive for fatigue. Negative for activity change, appetite change, chills, diaphoresis, fever and unexpected weight change.  HENT: Positive for congestion, postnasal drip, rhinorrhea and sinus pressure. Negative for ear pain, sinus pain, sore throat, trouble swallowing and voice change.   Eyes: Negative for visual disturbance.  Respiratory: Positive for cough. Negative for choking, chest tightness, shortness of breath and wheezing.    Cardiovascular: Negative for chest pain, palpitations and leg swelling.  Gastrointestinal: Negative for abdominal distention, abdominal pain, blood in stool, constipation, diarrhea, nausea and vomiting.  Endocrine: Negative for cold intolerance, heat intolerance, polydipsia, polyphagia and polyuria.  Musculoskeletal: Negative for neck pain and neck stiffness.  Skin: Negative for color change, pallor, rash and wound.  Neurological: Negative for dizziness and headaches.  Hematological: Does not bruise/bleed easily.  Psychiatric/Behavioral: Negative for sleep disturbance.       Objective:   Physical Exam Vitals signs and nursing note reviewed.  Constitutional:      General: She is not in acute distress.    Appearance: She is not ill-appearing, toxic-appearing or diaphoretic.  HENT:     Head: Normocephalic and atraumatic.     Right Ear: Tympanic membrane, ear canal and external ear normal. There is no impacted cerumen.     Left Ear: Tympanic membrane, ear canal and external ear normal. There is no impacted cerumen.     Nose: Congestion and rhinorrhea present.     Mouth/Throat:     Pharynx: No oropharyngeal exudate or posterior oropharyngeal erythema.  Eyes:     Extraocular Movements: Extraocular movements intact.     Conjunctiva/sclera: Conjunctivae normal.     Pupils: Pupils are equal, round, and reactive to light.  Neck:     Musculoskeletal: Normal range of motion.  Cardiovascular:     Rate and Rhythm: Normal rate.     Pulses: Normal pulses.     Heart sounds: Normal heart sounds.  Pulmonary:     Effort: Pulmonary effort is normal. No respiratory distress.     Breath sounds: Normal breath sounds. No stridor. No wheezing, rhonchi or rales.  Chest:     Chest wall: No tenderness.  Skin:    General: Skin is warm and dry.     Capillary Refill: Capillary refill takes less than 2 seconds.  Neurological:     Mental Status: She is alert and oriented to person, place, and time.   Psychiatric:        Mood and Affect: Mood normal.        Behavior: Behavior normal.        Thought Content: Thought content normal.        Judgment: Judgment normal.       Assessment & Plan:   1. Cough in adult     Cough in adult Please use Tessalon and Flonase as needed. Continue to push fluids and rest as often as possible. If you are still experiencing symptoms one week from today, please call clinic and we will send in Azithromycin.    FOLLOW-UP:  Return if symptoms worsen or fail to improve.

## 2018-05-14 NOTE — Assessment & Plan Note (Signed)
Please use Tessalon and Flonase as needed. Continue to push fluids and rest as often as possible. If you are still experiencing symptoms one week from today, please call clinic and we will send in Azithromycin.

## 2018-05-28 ENCOUNTER — Encounter: Payer: Self-pay | Admitting: Family Medicine

## 2018-05-28 ENCOUNTER — Ambulatory Visit: Payer: Medicare Other | Admitting: Family Medicine

## 2018-05-28 VITALS — BP 112/72 | HR 84 | Temp 98.6°F | Ht 63.25 in | Wt 120.6 lb

## 2018-05-28 DIAGNOSIS — J019 Acute sinusitis, unspecified: Secondary | ICD-10-CM

## 2018-05-28 DIAGNOSIS — R05 Cough: Secondary | ICD-10-CM | POA: Diagnosis not present

## 2018-05-28 DIAGNOSIS — R51 Headache: Secondary | ICD-10-CM

## 2018-05-28 DIAGNOSIS — R519 Headache, unspecified: Secondary | ICD-10-CM

## 2018-05-28 DIAGNOSIS — B9689 Other specified bacterial agents as the cause of diseases classified elsewhere: Secondary | ICD-10-CM

## 2018-05-28 DIAGNOSIS — R058 Other specified cough: Secondary | ICD-10-CM

## 2018-05-28 DIAGNOSIS — R509 Fever, unspecified: Secondary | ICD-10-CM

## 2018-05-28 MED ORDER — HYDROCOD POLST-CPM POLST ER 10-8 MG/5ML PO SUER: ORAL | 0 refills | Status: DC

## 2018-05-28 MED ORDER — AMOXICILLIN-POT CLAVULANATE 875-125 MG PO TABS: 1.0000 | ORAL_TABLET | Freq: Two times a day (BID) | ORAL | 0 refills | Status: DC

## 2018-05-28 MED ORDER — PREDNISONE 20 MG PO TABS: ORAL_TABLET | ORAL | 0 refills | Status: DC

## 2018-05-28 NOTE — Progress Notes (Deleted)
Subjective:    Patient ID: Mikayla Washington, female    DOB: 09/09/1945, 73 y.o.   MRN: [id number]  HPI:05/14/2018 OV:   [name] presents with non-productive cough, clear nasal drainage that started 4 days ago.  She also reports high fever at onset of sx's, highest 102 f She has been afebrile the last 3 days. She reports the cough/congestion improving each day, and states "I am just here to get checked out b/c of my age". 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 presents   Patient Care Team    Relationship Specialty Notifications Start End  Mellody Dance, DO PCP - General Family Medicine  02/22/16   Calvert Cantor, MD Consulting Physician Ophthalmology  02/22/16   Sable Feil, MD Consulting Physician Gastroenterology  02/22/16    Comment: Rudolpho Sevin    Patient Active Problem List   Diagnosis Date Noted  . Cough in adult 05/14/2018  . Productive cough 11/22/2016  . Acute maxillary sinusitis 11/22/2016  . Status post hysterectomy with oophorectomy in 2000 02/22/2016  . Hemorrhoids 02/22/2016  . Elevated HDL 02/22/2016  . Adopted person 02/22/2016  . Allergic rhinitis 11/28/2014  . Eustachian tube dysfunction 05/31/2014  . Liver cyst 02/18/2014  . Chronic constipation 01/11/2014  . HLD (hyperlipidemia) 11/26/2013  . Hx of adenomatous colonic polyps 11/02/2012  . Vitamin D deficiency 11/02/2012  . Osteopenia 11/02/2012  . Irritable bowel syndrome (IBS) 11/02/2012  . Tinnitus, subjective 11/02/2012  . Menopausal and postmenopausal disorder 11/03/2008  . Osteoarthritis 11/03/2008     Past Medical History:  Diagnosis Date  . Adenomatous colon polyp 2013   Dr Sharlett Iles  . Breast  disease    fibrocystic  . DJD (degenerative joint disease)      Past Surgical History:  Procedure Laterality Date  . ABDOMINAL HYSTERECTOMY    . BREAST LUMPECTOMY     X 3  . COLONOSCOPY  2002    Dr Sharlett Iles, negative  . colonoscopy with polypectomy  2013   adenomatous polyp  . TONSILLECTOMY    . TOTAL ABDOMINAL HYSTERECTOMY W/ BILATERAL SALPINGOOPHORECTOMY  2001    benign ovarian tumor (BSO also)     Family History  Adopted: Yes     Social History   Substance and Sexual Activity  Drug Use No     Social History   Substance and Sexual Activity  Alcohol Use No   Comment: rare, 1 x per year or at celebrations      Social History   Tobacco Use  Smoking Status Never Smoker  Smokeless Tobacco Never Used     Outpatient Encounter Medications as of 05/28/2018  Medication Sig  . benzonatate (TESSALON) 100 MG capsule Take 1 capsule (100 mg total) by mouth 2 (two) times daily as needed for cough.  . Calcium-Magnesium-Vitamin D (CALCIUM 1200+D3 PO) Take 1 tablet by mouth daily.  . fluticasone (FLONASE) 50 MCG/ACT nasal spray Place 2 sprays into both nostrils daily.  . Multiple Vitamins-Minerals (CENTRUM SILVER PO) Take 1 tablet by mouth daily.   No facility-administered encounter medications on file as of 05/28/2018.     Allergies: Patient has no known allergies.  There is no height or weight on file to calculate BMI.  There were no vitals taken for this visit.  Review of Systems  Constitutional: Positive for fatigue. Negative for activity change, appetite change, chills, diaphoresis, fever and unexpected weight change.  HENT: Positive for congestion, postnasal drip, rhinorrhea and sinus pressure. Negative for ear pain, sinus pain, sore throat, trouble swallowing and voice change.   Eyes: Negative for visual disturbance.  Respiratory: Positive for cough. Negative for choking, chest tightness, shortness of breath and wheezing.   Cardiovascular: Negative  for chest pain, palpitations and leg swelling.  Gastrointestinal: Negative for abdominal distention, abdominal pain, blood in stool, constipation, diarrhea, nausea and vomiting.  Endocrine: Negative for cold intolerance, heat intolerance, polydipsia, polyphagia and polyuria.  Musculoskeletal: Negative for neck pain and neck stiffness.  Skin: Negative for color change, pallor, rash and wound.  Neurological: Negative for dizziness and headaches.  Hematological: Does not bruise/bleed easily.  Psychiatric/Behavioral: Negative for sleep disturbance.       Objective:   Physical Exam Vitals signs and nursing note reviewed.  Constitutional:      General: She is not in acute distress.    Appearance: She is not ill-appearing, toxic-appearing or diaphoretic.  HENT:     Head: Normocephalic and atraumatic.     Right Ear: Tympanic membrane, ear canal and external ear normal. There is no impacted cerumen.     Left Ear: Tympanic membrane, ear canal and external ear normal. There is no impacted cerumen.     Nose: Congestion and rhinorrhea present.     Mouth/Throat:     Pharynx: No oropharyngeal exudate or posterior oropharyngeal erythema.  Eyes:     Extraocular Movements: Extraocular movements intact.     Conjunctiva/sclera: Conjunctivae normal.     Pupils: Pupils are equal, round, and reactive to light.  Neck:     Musculoskeletal: Normal range of motion.  Cardiovascular:     Rate and Rhythm: Normal rate.     Pulses: Normal pulses.     Heart sounds: Normal heart sounds.  Pulmonary:     Effort: Pulmonary effort is normal. No respiratory distress.     Breath sounds: Normal breath sounds. No stridor. No wheezing, rhonchi or rales.  Chest:     Chest wall: No tenderness.  Skin:    General: Skin is warm and dry.     Capillary Refill: Capillary refill takes less than 2 seconds.  Neurological:     Mental Status: She is alert and oriented to person, place, and time.  Psychiatric:        Mood and  Affect: Mood normal.        Behavior: Behavior normal.        Thought Content: Thought content normal.        Judgment: Judgment normal.       Assessment & Plan:   No diagnosis found.  No problem-specific Assessment & Plan notes found for this encounter.    FOLLOW-UP:  No follow-ups on file.

## 2018-05-28 NOTE — Patient Instructions (Signed)
Sinusitis, Adult Sinusitis is inflammation of your sinuses. Sinuses are hollow spaces in the bones around your face. Your sinuses are located:  Around your eyes.  In the middle of your forehead.  Behind your nose.  In your cheekbones. Mucus normally drains out of your sinuses. When your nasal tissues become inflamed or swollen, mucus can become trapped or blocked. This allows bacteria, viruses, and fungi to grow, which leads to infection. Most infections of the sinuses are caused by a virus. Sinusitis can develop quickly. It can last for up to 4 weeks (acute) or for more than 12 weeks (chronic). Sinusitis often develops after a cold. What are the causes? This condition is caused by anything that creates swelling in the sinuses or stops mucus from draining. This includes:  Allergies.  Asthma.  Infection from bacteria or viruses.  Deformities or blockages in your nose or sinuses.  Abnormal growths in the nose (nasal polyps).  Pollutants, such as chemicals or irritants in the air.  Infection from fungi (rare). What increases the risk? You are more likely to develop this condition if you:  Have a weak body defense system (immune system).  Do a lot of swimming or diving.  Overuse nasal sprays.  Smoke. What are the signs or symptoms? The main symptoms of this condition are pain and a feeling of pressure around the affected sinuses. Other symptoms include:  Stuffy nose or congestion.  Thick drainage from your nose.  Swelling and warmth over the affected sinuses.  Headache.  Upper toothache.  A cough that may get worse at night.  Extra mucus that collects in the throat or the back of the nose (postnasal drip).  Decreased sense of smell and taste.  Fatigue.  A fever.  Sore throat.  Bad breath. How is this diagnosed? This condition is diagnosed based on:  Your symptoms.  Your medical history.  A physical exam.  Tests to find out if your condition is  acute or chronic. This may include: ? Checking your nose for nasal polyps. ? Viewing your sinuses using a device that has a light (endoscope). ? Testing for allergies or bacteria. ? Imaging tests, such as an MRI or CT scan. In rare cases, a bone biopsy may be done to rule out more serious types of fungal sinus disease. How is this treated? Treatment for sinusitis depends on the cause and whether your condition is chronic or acute.  If caused by a virus, your symptoms should go away on their own within 10 days. You may be given medicines to relieve symptoms. They include: ? Medicines that shrink swollen nasal passages (topical intranasal decongestants). ? Medicines that treat allergies (antihistamines). ? A spray that eases inflammation of the nostrils (topical intranasal corticosteroids). ? Rinses that help get rid of thick mucus in your nose (nasal saline washes).  If caused by bacteria, your health care provider may recommend waiting to see if your symptoms improve. Most bacterial infections will get better without antibiotic medicine. You may be given antibiotics if you have: ? A severe infection. ? A weak immune system.  If caused by narrow nasal passages or nasal polyps, you may need to have surgery. Follow these instructions at home: Medicines  Take, use, or apply over-the-counter and prescription medicines only as told by your health care provider. These may include nasal sprays.  If you were prescribed an antibiotic medicine, take it as told by your health care provider. Do not stop taking the antibiotic even if you start   to have surgery.  Follow these instructions at home:  Medicines   Take, use, or apply over-the-counter and prescription medicines only as told by your health care provider. These may include nasal sprays.   If you were prescribed an antibiotic medicine, take it as told by your health care provider. Do not stop taking the antibiotic even if you start to feel better.  Hydrate and humidify     Drink enough fluid to keep your urine pale yellow. Staying hydrated will help to thin your mucus.   Use a cool mist humidifier to keep the humidity level in your home above 50%.   Inhale steam for 10-15 minutes, 3-4 times a day, or as told by your health care provider. You can do this in the bathroom while a hot shower is  running.   Limit your exposure to cool or dry air.  Rest   Rest as much as possible.   Sleep with your head raised (elevated).   Make sure you get enough sleep each night.  General instructions     Apply a warm, moist washcloth to your face 3-4 times a day or as told by your health care provider. This will help with discomfort.   Wash your hands often with soap and water to reduce your exposure to germs. If soap and water are not available, use hand sanitizer.   Do not smoke. Avoid being around people who are smoking (secondhand smoke).   Keep all follow-up visits as told by your health care provider. This is important.  Contact a health care provider if:   You have a fever.   Your symptoms get worse.   Your symptoms do not improve within 10 days.  Get help right away if:   You have a severe headache.   You have persistent vomiting.   You have severe pain or swelling around your face or eyes.   You have vision problems.   You develop confusion.   Your neck is stiff.   You have trouble breathing.  Summary   Sinusitis is soreness and inflammation of your sinuses. Sinuses are hollow spaces in the bones around your face.   This condition is caused by nasal tissues that become inflamed or swollen. The swelling traps or blocks the flow of mucus. This allows bacteria, viruses, and fungi to grow, which leads to infection.   If you were prescribed an antibiotic medicine, take it as told by your health care provider. Do not stop taking the antibiotic even if you start to feel better.   Keep all follow-up visits as told by your health care provider. This is important.  This information is not intended to replace advice given to you by your health care provider. Make sure you discuss any questions you have with your health care provider.  Document Released: 04/04/2005 Document Revised: 09/04/2017 Document Reviewed: 09/04/2017  Elsevier Interactive Patient Education  2019 [employer]      Sinus  Headache    A sinus headache occurs when your sinuses become clogged or swollen. Sinuses are air-filled spaces in your skull that are behind the bones of your face and forehead. Sinus headaches can range from mild to severe.  What are the causes?  A sinus headache can result from various conditions that affect the sinuses. Common causes include:   Colds.   Sinus infections.   Allergies.  Many people confuse sinus headaches with migraines or tension headaches because those headaches can also cause facial pain and   nasal symptoms.  What are the signs or symptoms?  The main symptom of this condition is a headache that may feel like pain or pressure in your face, forehead, ears, or upper teeth. People who have a sinus headache often have other symptoms, such as:   Congested or runny nose.   Fever.   Inability to smell.  Weather changes can make symptoms worse.  How is this diagnosed?  This condition may be diagnosed based on:   A physical exam and medical history.   Imaging tests, such as a CT scan or MRI, to check for problems with the sinuses.   Examination of the sinuses using a thin tool with a camera that is inserted through your nose (endoscopy).  How is this treated?  Treatment for this condition depends on the cause.   Sinus pain that is caused by a sinus infection may be treated with antibiotic medicine.   Sinus pain that is caused by allergies may be helped by allergy medicines (antihistamines) and medicated nasal sprays.   Sinus pain that is caused by congestion may be helped by rinsing out (flushing) the nose and sinuses with saline solution.   Sinus surgery may be needed in some cases if other treatments do not help.  Follow these instructions at home:  General instructions   If directed:  ? Apply a warm, moist washcloth to your face to help relieve pain.  ? Use a nasal saline wash.  Medicines     Take over-the-counter and prescription medicines only as told by your health care provider.   If  you were prescribed an antibiotic medicine, take it as told by your health care provider. Do not stop taking the antibiotic even if you start to feel better.   If you have congestion, use a nasal spray to help lessen pressure.  Hydrate and humidify   Drink enough water to keep your urine clear or pale yellow. Staying hydrated will help to thin your mucus.   Use a cool mist humidifier to keep the humidity level in your home above 50%.   Inhale steam for 10-15 minutes, 3-4 times a day or as told by your health care provider. You can do this in the bathroom while a hot shower is running.   Limit your exposure to cool or dry air.  Contact a health care provider if:   You have a headache more than one time a week.   You have sensitivity to light or sound.   You develop a fever.   You feel nauseous or you vomit.   Your headaches do not get better with treatment. Many people think that they have a sinus headache when they actually have a migraine or a tension headache.  Get help right away if:   You have vision problems.   You have sudden, severe pain in your face or head.   You have a seizure.   You are confused.   You have a stiff neck.  Summary   A sinus headache occurs when your sinuses become clogged or swollen.   A sinus headache can result from various conditions that affect the sinuses, such as a cold, a sinus infection, or an allergy.   Treatment for this condition depends on the cause. It may include medicine, such as antibiotics or antihistamines.  This information is not intended to replace advice given to you by your health care provider. Make sure you discuss any questions you have with your health care provider.

## 2018-05-28 NOTE — Progress Notes (Signed)
Acute Care Office visit  Assessment and plan:  1. Acute bacterial sinusitis   2. Cough with sputum   3. Face pain   4. Fever and chills- in recent past      Acute Bacterial sinusitis   - Viral vs Allergic vs Bacterial causes for pt's symptoms reveiwed.    -Will give the patient an injection of prednisone today along with a prednisone taper to start tomorrow.  -Will prescribe tussionex and augmentin today  - Supportive care and various OTC medications discussed in addition to any prescribed. -Will write the patient out of work for the rest of the week - Call or RTC if new symptoms, or if no improvement or worse over next several days.     Meds ordered this encounter  Medications  . amoxicillin-clavulanate (AUGMENTIN) 875-125 MG tablet    Sig: Take 1 tablet by mouth 2 (two) times daily.    Dispense:  20 tablet    Refill:  0  . predniSONE (DELTASONE) 20 MG tablet    Sig: Take 2 tabs po * 3 days, then 1 tabs for 3 d, then 1/2 tab for 2 days    Dispense:  15 tablet    Refill:  0  . chlorpheniramine-HYDROcodone (TUSSIONEX) 10-8 MG/5ML SUER    Sig: 2.5- 5 ml q 12 hrs prn cough (can cause drowsiness)    Dispense:  115 mL    Refill:  0    There are no discontinued medications.   No orders of the defined types were placed in this encounter.   Gross side effects, risk and benefits, and alternatives of medications discussed with patient.  Patient is aware that all medications have potential side effects and we are unable to predict every sideeffect or drug-drug interaction that may occur.  Expresses verbal understanding and consents to current therapy plan and treatment regiment.   Education and routine counseling performed. Handouts provided.  Anticipatory guidance and routine counseling done re: condition, txmnt options and need for follow up. All questions of patient's were answered.  Return if symptoms worsen or fail to improve, for F-up of current med issues as  previously d/c pt.  Please see AVS handed out to patient at the end of our visit for additional patient instructions/ counseling done pertaining to today's office visit.  Note:  This document was partially repared using Dragon voice recognition software and may include unintentional dictation errors.  This document serves as a record of services personally performed by Mellody Dance, DO. It was created on her behalf by Steva Colder, a trained medical scribe. The creation of this record is based on the scribe's personal observations and the provider's statements to them.   I have reviewed the above medical documentation for accuracy and completeness and I concur.  Mellody Dance, DO 05/29/2018 7:27 AM      Subjective:    Chief Complaint  Patient presents with  . Cough    HPI:  Pt presents with Sx for 3 weeks   C/o: persistent productive cough, resolved fevers, sinus pressure (worse on left side)  Denies: She denies any other symptoms.     For symptoms patient has tried:  Rx flonase, tessalon perles, and pushing fluids  Overall getting:  Her symptoms haven't improved either. She was seen by Mina Marble, NP on 05/14/2018 and given flonase and tessalon perles with no relief of her symptoms.    Patient Care Team    Relationship Specialty Notifications Start End  Cadan Maggart,  Neoma Laming, DO PCP - General Family Medicine  02/22/16   Calvert Cantor, MD Consulting Physician Ophthalmology  02/22/16   Sable Feil, MD Consulting Physician Gastroenterology  02/22/16    Comment: Rudolpho Sevin    Past medical history, Surgical history, Family history reviewed and noted below, Social history, Allergies, and Medications have been entered into the medical record, reviewed and changed as needed.   No Known Allergies  Review of Systems: - see above HPI for pertinent positives General:   No F/C, wt loss Pulm:   No DIB, pleuritic chest pain Card:  No CP, palpitations Abd:  No n/v/d or  pain Ext:  No inc edema from baseline   Objective:   Blood pressure 112/72, pulse 84, temperature 98.6 F (37 C), height 5' 3.25" (1.607 m), weight 120 lb 9.6 oz (54.7 kg), SpO2 98 %. Body mass index is 21.19 kg/m. General: Well Developed, well nourished, appropriate for stated age.  Neuro: Alert and oriented x3, extra-ocular muscles intact, sensation grossly intact.  HEENT: Normocephalic, atraumatic, pupils equal round reactive to light, neck supple, no masses, no painful lymphadenopathy, TM's intact B/L, no acute findings. Nares- patent, clear d/c, OP- clear, mild erythema, Pain to percussing over left frontal and left maxillary sinuses. Anterior and posterior cervical lymphadenopathy most prominent on left.  Skin: Warm and dry, no gross rash. Cardiac: RRR, S1 S2,  no murmurs rubs or gallops.  Respiratory: ECTA B/L and A/P, Not using accessory muscles, speaking in full sentences- unlabored. Vascular:  No gross lower ext edema, cap RF less 2 sec. Psych: No HI/SI, judgement and insight good, Euthymic mood. Full Affect.

## 2018-08-09 ENCOUNTER — Other Ambulatory Visit (INDEPENDENT_AMBULATORY_CARE_PROVIDER_SITE_OTHER): Payer: Medicare Other

## 2018-08-09 ENCOUNTER — Other Ambulatory Visit: Payer: Self-pay

## 2018-08-09 DIAGNOSIS — Z1211 Encounter for screening for malignant neoplasm of colon: Secondary | ICD-10-CM

## 2018-08-13 LAB — POC HEMOCCULT BLD/STL (HOME/3-CARD/SCREEN)
Card #2 Fecal Occult Blod, POC: NEGATIVE
Card #3 Fecal Occult Blood, POC: NEGATIVE
Fecal Occult Blood, POC: NEGATIVE

## 2018-09-27 ENCOUNTER — Encounter: Payer: Self-pay | Admitting: Family Medicine

## 2018-09-27 ENCOUNTER — Other Ambulatory Visit: Payer: Self-pay

## 2018-09-27 ENCOUNTER — Ambulatory Visit (INDEPENDENT_AMBULATORY_CARE_PROVIDER_SITE_OTHER): Payer: Medicare Other | Admitting: Family Medicine

## 2018-09-27 VITALS — Temp 97.6°F | Ht 63.0 in | Wt 124.8 lb

## 2018-09-27 DIAGNOSIS — R7303 Prediabetes: Secondary | ICD-10-CM | POA: Diagnosis not present

## 2018-09-27 DIAGNOSIS — E7889 Other lipoprotein metabolism disorders: Secondary | ICD-10-CM | POA: Diagnosis not present

## 2018-09-27 DIAGNOSIS — E785 Hyperlipidemia, unspecified: Secondary | ICD-10-CM

## 2018-09-27 DIAGNOSIS — E559 Vitamin D deficiency, unspecified: Secondary | ICD-10-CM

## 2018-09-27 DIAGNOSIS — L819 Disorder of pigmentation, unspecified: Secondary | ICD-10-CM

## 2018-09-27 MED ORDER — VITAMIN D (ERGOCALCIFEROL) 1.25 MG (50000 UNIT) PO CAPS: ORAL_CAPSULE | ORAL | 3 refills | Status: DC

## 2018-09-27 NOTE — Progress Notes (Signed)
Virtual / live video office visit note for [employer], D.O- Primary Care Physician at [facility]   I connected with current patient today and beyond visually recognizing the correct individual, I verified that I am speaking with the correct person using two identifiers.  . Location of the patient: Home . Location of the provider: Office Only the patient (+/- their family members at pt's discretion) and myself were participating in the encounter    - This visit type was conducted due to national recommendations for restrictions regarding the COVID-19 Pandemic (e.g. social distancing) in an effort to limit this patient's exposure and mitigate transmission in our community.  This format is felt to be most appropriate for this patient at this time.   - The patient did have access to video technology today yet, we had technical difficulties with this method, requiring transitioning to audio only.    - No physical exam could be performed with this format, beyond that communicated to [place] by the patient/ family members as noted.   - Additionally my office staff/ schedulers discussed with the patient that there may be a monetary charge related to this service, depending on patient's medical insurance.   The patient expressed understanding, and agreed to proceed.      History of Present Illness:  There hass been sometime since I seen patient for chronic care at least 6 months.  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 Keeping distance and using mask etc.    She always gets chronic bronchitis flairs in the early winter.  But feeling well  Pre-Diabetes:   6 mo ago-  5.8.  Eating healthier, not much sweets b/c husband has DM.   HLD:   CHOL HPI:   Patient reports compliance with low chol/ saturated and trans fat diet.  No exercise  Last lipid panel as follows:  Lab Results  Component Value Date   CHOL 164 03/28/2018   HDL 62 03/28/2018   LDLCALC 91 03/28/2018   LDLDIRECT 124.2 11/02/2012   TRIG 56 03/28/2018   CHOLHDL 2.6 03/28/2018    Hepatic Function Latest Ref Rng & Units 03/28/2018 03/24/2016 11/28/2014  Total Protein 6.0 - 8.5 g/dL 6.2 7.0 7.0  Albumin 3.5 - 4.8 g/dL 4.3 4.5 4.3  AST 0 - 40 IU/L '16 15 14  '$ ALT 0 - 32 IU/L '10 12 10  '$ Alk Phosphatase 39 - 117 IU/L 91 82 81  Total Bilirubin 0.0 - 1.2 mg/dL 0.4 0.5 0.4  Bilirubin, Direct 0.0 - 0.3 mg/dL - - 0.1        Impression and Recommendations:    1. Prediabetes   2. Vitamin D deficiency   3. Hyperlipidemia, unspecified hyperlipidemia type   4. Elevated HDL   5. Irregular pigmentation of skin-on finger of hand    -Prediabetes well-controlled 5.86 months ago.  We will check yearly.  Reminded patient that sweets, carbohydrate rich foods and diets etc. will all contribute to progression to diabetes.  Patient is well aware of this and states husband is a diabetic.  Reminded patient to exercise daily and meet AHA guidelines  Vitamin D deficiency-patient did not go on supplement as last told back in December.  She still just taking her combo pill and multivitamin that she was taking back then.  She was told to take 05-4998 daily.  She has not.  After discussion patient wishes to have a once weekly prescription pill call in.  Prescription sent.  We will recheck in 4 to 6 months.  For her hyperlipidemia and elevated HDL-has been well controlled last couple of times.  We will continue to monitor this yearly.  Reminded patient of low saturated trans-fat diet today and healthy exercise habits  -Patient states new onset of an irregular "mole "or skin lesion on her hand.  She states it is slowly growing and does not look like her other moles. -Advised patient to make a follow-up with me in the very near future as an in clinic visit to evaluate this mole and possibly remove it.  - As part of my medical decision making, I reviewed the following data within the [place] History  obtained from pt /family, CMA notes reviewed and incorporated if applicable, Labs reviewed, Radiograph/ tests reviewed if applicable and OV notes from prior OV's with me, as well as other specialists she/he has seen since seeing me last, were all reviewed and used in my medical decision making process today.   - Additionally, discussion had with patient regarding txmnt plan, their biases about that plan etc were used in my medical decision making today.   - The patient agreed with the plan and demonstrated an understanding of the instructions.   No barriers to understanding were identified.   - Red flag symptoms and signs discussed in detail.  Patient expressed understanding regarding what to do in case of emergency\ urgent symptoms.  The patient was advised to call back or seek an in-person evaluation if the symptoms worsen or if the condition fails to improve as anticipated.   Return Near future for irregular skin lesion evaluation and possible removal-inpatient visit, for Also, for chronic follow-up-6 months for Medicare wellness and fasting blood work..     Meds ordered this encounter  Medications  . Vitamin D, Ergocalciferol, (DRISDOL) 1.25 MG (50000 UT) CAPS capsule    Sig: Take one tablet wkly    Dispense:  12 capsule    Refill:  3    Medications Discontinued During This Encounter  Medication Reason  . amoxicillin-clavulanate (AUGMENTIN) 875-125 MG tablet Completed Course  . benzonatate (TESSALON) 100 MG capsule Completed Course  . chlorpheniramine-HYDROcodone ([place]) 10-8 MG/5ML SUER Completed Course  . predniSONE (DELTASONE) 20 MG tablet Completed Course      I provided 22+ minutes of non-face-to-face time during this encounter,with over 50% of the time in direct counseling on patients medical conditions/ medical concerns.  Additional time was spent with charting and coordination of care after the actual visit commenced.   Note:  This note was prepared with assistance of  Dragon voice recognition software. Occasional wrong-word or sound-a-like substitutions may have occurred due to the inherent limitations of voice recognition software.  Mellody Dance, DO 09/27/2018 9:19 AM       Patient Care Team    Relationship Specialty Notifications Start End  Mellody Dance, DO PCP - General Family Medicine  02/22/16   Calvert Cantor, MD Consulting Physician Ophthalmology  02/22/16   Sable Feil, MD Consulting Physician Gastroenterology  02/22/16    Comment: Rudolpho Sevin    -Vitals obtained; medications/ allergies reconciled;  personal medical, social, Sx etc.histories were updated by CMA, reviewed by me and are reflected in chart  Patient Active Problem List   Diagnosis Date Noted  . Prediabetes 09/27/2018    Priority: High  . Elevated HDL 02/22/2016    Priority: High  . HLD (hyperlipidemia) 11/26/2013    Priority: High  . Adopted person 02/22/2016  Priority: Medium  . Vitamin D deficiency 11/02/2012    Priority: Low  . Irregular pigmentation of skin 09/27/2018  . Cough in adult 05/14/2018  . Productive cough 11/22/2016  . Acute maxillary sinusitis 11/22/2016  . Status post hysterectomy with oophorectomy in 2000 02/22/2016  . Hemorrhoids 02/22/2016  . Allergic rhinitis 11/28/2014  . Eustachian tube dysfunction 05/31/2014  . Liver cyst 02/18/2014  . Chronic constipation 01/11/2014  . Hx of adenomatous colonic polyps 11/02/2012  . Osteopenia 11/02/2012  . Irritable bowel syndrome (IBS) 11/02/2012  . Tinnitus, subjective 11/02/2012  . Menopausal and postmenopausal disorder 11/03/2008  . Osteoarthritis 11/03/2008     Current Meds  Medication Sig  . Calcium-Magnesium-Vitamin D (CALCIUM 1200+D3 PO) Take 1 tablet by mouth daily.  . fluticasone (FLONASE) 50 MCG/ACT nasal spray Place 2 sprays into both nostrils daily.  . Multiple Vitamins-Minerals (CENTRUM SILVER PO) Take 1 tablet by mouth daily.     No Known Allergies   ROS:  See  above HPI for pertinent positives and negatives   Objective:   Temperature 97.6 F (36.4 C), height '5\' 3"'$  (1.6 m), weight 124 lb 12.8 oz (56.6 kg).  (if some vitals are omitted, this means that patient was UNABLE to obtain them even though they were asked to get them prior to OV today.  They were asked to call us at their earliest convenience with these once obtained.)  General: A & O * 3; visually in no acute distress; in usual state of health.  Skin: Visible skin appears normal and pt's usual skin color HEENT:  EOMI, head is normocephalic and atraumatic.  Sclera are anicteric. Neck has a good range of motion.  Lips are noncyanotic Chest: normal chest excursion and movement Respiratory: speaking in full sentences, no conversational dyspnea; no use of accessory muscles Psych: insight good, mood- appears full

## 2018-10-03 ENCOUNTER — Other Ambulatory Visit: Payer: Self-pay

## 2018-10-03 ENCOUNTER — Other Ambulatory Visit (HOSPITAL_COMMUNITY)
Admission: RE | Admit: 2018-10-03 | Discharge: 2018-10-03 | Disposition: A | Discharge status: Home / Self Care | Payer: Medicare Other | Source: Ambulatory Visit | Attending: Family Medicine | Admitting: Family Medicine

## 2018-10-03 ENCOUNTER — Encounter: Payer: Self-pay | Admitting: Family Medicine

## 2018-10-03 ENCOUNTER — Ambulatory Visit: Payer: Medicare Other | Admitting: Family Medicine

## 2018-10-03 VITALS — BP 94/63 | HR 76 | Temp 98.2°F | Ht 63.0 in | Wt 131.7 lb

## 2018-10-03 DIAGNOSIS — D229 Melanocytic nevi, unspecified: Secondary | ICD-10-CM | POA: Insufficient documentation

## 2018-10-03 NOTE — Patient Instructions (Signed)
Wound Care, Adult Taking care of your wound properly can help to prevent pain, infection, and scarring. It can also help your wound to heal more quickly. How to care for your wound Wound care      Follow instructions from your health care provider about how to take care of your wound. Make sure you: ? Wash your hands with soap and water before you change the bandage (dressing). If soap and water are not available, use hand sanitizer. ? Change your dressing as told by your health care provider. ? Leave stitches (sutures), skin glue, or adhesive strips in place. These skin closures may need to stay in place for 2 weeks or longer. If adhesive strip edges start to loosen and curl up, you may trim the loose edges. Do not remove adhesive strips completely unless your health care provider tells you to do that.  Check your wound area every day for signs of infection. Check for: ? Redness, swelling, or pain. ? Fluid or blood. ? Warmth. ? Pus or a bad smell.  Ask your health care provider if you should clean the wound with mild soap and water. Doing this may include: ? Using a clean towel to pat the wound dry after cleaning it. Do not rub or scrub the wound. ? Applying a cream or ointment. Do this only as told by your health care provider. ? Covering the incision with a clean dressing.  Ask your health care provider when you can leave the wound uncovered.  Keep the dressing dry until your health care provider says it can be removed. Do not take baths, swim, use a hot tub, or do anything that would put the wound underwater until your health care provider approves. Ask your health care provider if you can take showers. You may only be allowed to take sponge baths. Medicines   If you were prescribed an antibiotic medicine, cream, or ointment, take or use the antibiotic as told by your health care provider. Do not stop taking or using the antibiotic even if your condition improves.  Take  over-the-counter and prescription medicines only as told by your health care provider. If you were prescribed pain medicine, take it 30 or more minutes before you do any wound care or as told by your health care provider. General instructions  Return to your normal activities as told by your health care provider. Ask your health care provider what activities are safe.  Do not scratch or pick at the wound.  Do not use any products that contain nicotine or tobacco, such as cigarettes and e-cigarettes. These may delay wound healing. If you need help quitting, ask your health care provider.  Keep all follow-up visits as told by your health care provider. This is important.  Eat a diet that includes protein, vitamin A, vitamin C, and other nutrient-rich foods to help the wound heal. ? Foods rich in protein include meat, dairy, beans, nuts, and other sources. ? Foods rich in vitamin A include carrots and dark green, leafy vegetables. ? Foods rich in vitamin C include citrus, tomatoes, and other fruits and vegetables. ? Nutrient-rich foods have protein, carbohydrates, fat, vitamins, or minerals. Eat a variety of healthy foods including vegetables, fruits, and whole grains. Contact a health care provider if:  You received a tetanus shot and you have swelling, severe pain, redness, or bleeding at the injection site.  Your pain is not controlled with medicine.  You have redness, swelling, or pain around the wound.    You have fluid or blood coming from the wound.  Your wound feels warm to the touch.  You have pus or a bad smell coming from the wound.  You have a fever or chills.  You are nauseous or you vomit.  You are dizzy. Get help right away if:  You have a red streak going away from your wound.  The edges of the wound open up and separate.  Your wound is bleeding, and the bleeding does not stop with gentle pressure.  You have a rash.  You faint.  You have trouble breathing.  Summary  Always wash your hands with soap and water before changing your bandage (dressing).  To help with healing, eat foods that are rich in protein, vitamin A, vitamin C, and other nutrients.  Check your wound every day for signs of infection. Contact your health care provider if you suspect that your wound is infected. This information is not intended to replace advice given to you by your health care provider. Make sure you discuss any questions you have with your health care provider. Document Released: 01/12/2008 Document Revised: 05/16/2017 Document Reviewed: 10/20/2015 Elsevier Interactive Patient Education  2019 [employer].   Excision of Skin Lesions Excision of a skin lesion is the removal of a section of skin by making small cuts (incisions) in the skin. Through this process, the lesion is completely removed. This procedure is often done to treat or prevent cancer or infection. It may also be done to improve cosmetic appearance. The procedure may be done to remove:  Cancerous (malignant) growths, such as basal cell carcinoma, squamous cell carcinoma, or melanoma.  Noncancerous (benign) growths, such as a cyst or lipoma.  Growths, such as moles or skin tags, which may be removed for cosmetic reasons. Various excision or surgical techniques may be used depending on your condition, the location of the lesion, and your overall health. Tell a health care provider about:  Any allergies you have.  All medicines you are taking, including vitamins, herbs, eye drops, creams, and over-the-counter medicines.  Any problems you or family members have had with anesthetic medicines.  Any blood disorders you have.  Any surgeries you have had.  Any medical conditions you have or have had.  Whether you are pregnant or may be pregnant. What are the risks? Generally, this is a safe procedure. However, problems may occur, including:  Bleeding.  Infection.  Scarring.   Recurrence of the cyst, lipoma, or cancer.  Changes in skin sensation or appearance, such as discoloration or swelling.  Reaction to the anesthetics.  Allergic reaction to surgical materials or ointments.  Damage to nerves, blood vessels, muscles, or other structures.  Continued pain. What happens before the procedure? Medicines Ask your health care provider about:  Changing or stopping your regular medicines. This is especially important if you are taking diabetes medicines or blood thinners.  Taking medicines such as aspirin and ibuprofen. These medicines can thin your blood. Do not take these medicines unless your health care provider tells you to take them.  Taking over-the-counter medicines, vitamins, herbs, and supplements. General instructions  You may be asked to stop smoking.  You may have an exam or testing.  Ask your health care provider what steps will be taken to help prevent infection. These may include: ? Removing hair at the surgery site. ? Washing skin with a germ-killing soap. ? Taking antibiotic medicine. What happens during the procedure?   You will be given a medicine to  numb the area (local anesthetic).  Your health care provider will remove the lesions using one of the following excision techniques. ? Complete surgical excision. This procedure may be done to treat a cancerous growth or a noncancerous cyst or lesion.  A small scalpel or scissors will be used to gently cut around and under the lesion until it is completely removed.  If bleeding occurs, it will be stopped with a device that delivers heat (electrocautery).  The edges of the wound may be stitched (sutured) together.  A bandage (dressing) will be applied.  Samples will be sent to a lab for testing. ? Excision of a cyst.  An incision will be made on the cyst.  The entire cyst will be removed through the incision.  The incision may be closed with sutures. ? Shave excision. This may  be done to remove a mole or a skin tag.  A small blade or an electrically heated loop instrument will be used to shave off the lesion.  The wound is usually left to heal on its own without sutures. ? Punch excision. This may be done to remove a mole or a scar or to do a biopsy of the lesion.  A small tool that is like a cookie cutter or a hole punch is used to cut a circle shape out of the skin.  The outer edges of the skin will be sutured together.  The sample may be sent to a lab for testing. ? Mohs micrographic surgery. This is usually done to treat skin cancer. This type of excision is mostly used on the face and ears. This procedure is minimally invasive, and it ensures the best cosmetic outcome.  A scalpel or a loop instrument will be used to remove layers of the lesion until all the abnormal or cancerous tissue has been removed.  The wound may be sutured, depending on its size.  The tissue will be checked under a microscope right away. Each of the techniques may vary among health care providers and hospitals. At the end of any of these procedures, antibiotic ointment will be applied as needed. What happens after the procedure?  Return to your normal activities as told by your health care provider. Ask your health care provider what activities are safe for you.  It is up to you to get the results of your procedure. Ask your doctor, or the department that is doing the procedure, when your results will be ready.  Talk with your health care provider to discuss any test results, treatment options, and if necessary, the need for more tests.  Keep all follow-up visits as told by your health care provider. This is important. Summary  Excision of a skin lesion is the removal of a section of skin by making small cuts (incisions) in the skin. This procedure is often done to treat or prevent cancer and infection, or it may be done to improve cosmetic appearance.  Various excision or  surgical techniques may be used depending on your condition, the location of the lesion, and your overall health.  After the procedure, talk with your health care provider to discuss any test results, treatment options, and if necessary, the need for more tests.  Keep all follow-up visits as told by your health care provider. This is important. This information is not intended to replace advice given to you by your health care provider. Make sure you discuss any questions you have with your health care provider. Document Released: 06/29/2009 Document  Revised: 10/11/2017 Document Reviewed: 10/11/2017 Elsevier Interactive Patient Education  [employer].

## 2018-10-03 NOTE — Progress Notes (Signed)
Impression and Recommendations:    1. Atypical nevi * 2     Atypical nevi * 2 - Plan: Dermatology pathology([place]), Dermatology pathology([place])  Indication: Atypical Nevi- with biopsy x2.  1 shave, 1 punch.  Medical necessity statement:  On physical examination, irregular skin lesions present with atypical features worrisome for pre-cancerous/ cancerous condition  Consent:  Discussed benefits and risks of procedure and verbal consent obtained  Procedure:  Patient was prepped in sterile fashion for the procedure.  Injection of 2% lido w/o epi used for anesthesia. In sterile fashion, shave and punch biopsy performed on 2 separate skin lesions.  More proximal smaller flat lesion via punch and distal hyperkeratotic raised skin lesion with shave biopsy.  Hemostasis obtained with use of silver nitrate sticks and pressure to area.  Pt tolerated well. No complications  Blood Loss:  less than 1 cc  Post-Care Instructions:   Proper wound care discussed with patient.  If wound does not continue improve as expected, patient told to notify us immediately  Gross side effects, risk and benefits, and alternatives of medications and treatment plan in general discussed with patient.  Patient is aware that all medications have potential side effects and we are unable to predict every side effect or drug-drug interaction that may occur.   Patient will call with any questions prior to using medication if they have concerns.    Expresses verbal understanding and consents to current therapy and treatment regimen.  No barriers to understanding were identified.  Red flag symptoms and signs discussed in detail.  Patient expressed understanding regarding what to do in case of emergency\urgent symptoms  Please see AVS handed out to patient at the end of our visit for further patient instructions/ counseling done pertaining to today's office visit.   Return for f/up chronic care as previously  discussed.     Note:  This note was prepared with assistance of Dragon voice recognition software. Occasional wrong-word or sound-a-like substitutions may have occurred due to the inherent limitations of voice recognition software.   Mellody Dance, DO  10/03/2018 2:41 PM    --------------------------------------------------------------------------------------------------------------------------------------------------------------------------------------------------------------------------------------------    Subjective:     HPI: Mikayla Washington is a 73 y.o. female who presents to [place] at [facility] today for issues as discussed below.   Patient presents with c/o a "funny looking mole" on the dorsal surface of her right hand.  She states is been there for about 6 to 8 months but, it is changed and it has a central darkening about it that is not uniform.  This had concerned her and she presented to the clinic today to discuss.  Per patient she had several precancerous skin moles removed several years ago by her previous PCP-she said in the early 90s- but never was diagnosed with cancer etc.   Wt Readings from Last 3 Encounters:  10/03/18 131 lb 11.2 oz (59.7 kg)  09/27/18 124 lb 12.8 oz (56.6 kg)  05/28/18 120 lb 9.6 oz (54.7 kg)   BP Readings from Last 3 Encounters:  10/03/18 94/63  05/28/18 112/72  05/14/18 118/71   Pulse Readings from Last 3 Encounters:  10/03/18 76  05/28/18 84  05/14/18 80   BMI Readings from Last 3 Encounters:  10/03/18 23.33 kg/m  09/27/18 22.11 kg/m  05/28/18 21.19 kg/m     Patient Care Team    Relationship Specialty Notifications Start End  Mellody Dance, DO PCP - General Family Medicine  02/22/16   Calvert Cantor, MD Consulting Physician Ophthalmology  02/22/16   Sable Feil, MD Consulting Physician Gastroenterology  02/22/16    Comment: Rudolpho Sevin     Patient Active Problem List   Diagnosis Date Noted  .  Prediabetes 09/27/2018    Priority: High  . Elevated HDL 02/22/2016    Priority: High  . HLD (hyperlipidemia) 11/26/2013    Priority: High  . Adopted person 02/22/2016    Priority: Medium  . Vitamin D deficiency 11/02/2012    Priority: Low  . Atypical nevi * 2 10/03/2018  . Irregular pigmentation of skin 09/27/2018  . Cough in adult 05/14/2018  . Productive cough 11/22/2016  . Acute maxillary sinusitis 11/22/2016  . Status post hysterectomy with oophorectomy in 2000 02/22/2016  . Hemorrhoids 02/22/2016  . Allergic rhinitis 11/28/2014  . Eustachian tube dysfunction 05/31/2014  . Liver cyst 02/18/2014  . Chronic constipation 01/11/2014  . Hx of adenomatous colonic polyps 11/02/2012  . Osteopenia 11/02/2012  . Irritable bowel syndrome (IBS) 11/02/2012  . Tinnitus, subjective 11/02/2012  . Menopausal and postmenopausal disorder 11/03/2008  . Osteoarthritis 11/03/2008    Past Medical history, Surgical history, Family history, Social history, Allergies and Medications have been entered into the medical record, reviewed and changed as needed.    Current Meds  Medication Sig  . Calcium-Magnesium-Vitamin D (CALCIUM 1200+D3 PO) Take 1 tablet by mouth daily.  . fluticasone (FLONASE) 50 MCG/ACT nasal spray Place 2 sprays into both nostrils daily.  . Multiple Vitamins-Minerals (CENTRUM SILVER PO) Take 1 tablet by mouth daily.  . Vitamin D, Ergocalciferol, (DRISDOL) 1.25 MG (50000 UT) CAPS capsule Take one tablet wkly    Allergies:  No Known Allergies   Review of Systems:  A fourteen system review of systems was performed and found to be positive as per HPI.   Objective:   Blood pressure 94/63, pulse 76, temperature 98.2 F (36.8 C), height 5\' 3"  (1.6 m), weight 131 lb 11.2 oz (59.7 kg), SpO2 95 %. Body mass index is 23.33 kg/m. General:  Well Developed, well nourished, appropriate for stated age.  Neuro:  Alert and oriented,  extra-ocular muscles intact  HEENT:   Normocephalic, atraumatic, neck supple, no carotid bruits appreciated  Skin:  no gross rash, warm, pink. -1-1/2 mm spherical skin hyper- melanotic flat lesion dorsal surface right distal forearm.  Second lesion: Hyperkeratotic raised hypermelanotic skin lesion irregular colored with darkened area around 3 o'clock position which is 3-4 mm diameter at the base between fourth and fifth digits on dorsal surface of hand. Respiratory: Not using accessory muscles, speaking in full sentences- unlabored. Vascular:  Ext warm, no cyanosis apprec.; cap RF less 2 sec. Psych:  No HI/SI, judgement and insight good, Euthymic mood. Full Affect.

## 2019-03-26 ENCOUNTER — Emergency Department (HOSPITAL_COMMUNITY)
Admission: EM | Admit: 2019-03-26 | Discharge: 2019-03-26 | Disposition: A | Discharge status: Home / Self Care | Payer: Medicare Other | Attending: Emergency Medicine | Admitting: Emergency Medicine

## 2019-03-26 ENCOUNTER — Telehealth: Payer: Self-pay

## 2019-03-26 ENCOUNTER — Emergency Department (HOSPITAL_COMMUNITY): Payer: Medicare Other

## 2019-03-26 ENCOUNTER — Encounter (HOSPITAL_COMMUNITY): Payer: Self-pay | Admitting: *Deleted

## 2019-03-26 DIAGNOSIS — Z79899 Other long term (current) drug therapy: Secondary | ICD-10-CM | POA: Insufficient documentation

## 2019-03-26 DIAGNOSIS — R079 Chest pain, unspecified: Secondary | ICD-10-CM | POA: Diagnosis present

## 2019-03-26 DIAGNOSIS — R0789 Other chest pain: Secondary | ICD-10-CM | POA: Insufficient documentation

## 2019-03-26 LAB — COMPREHENSIVE METABOLIC PANEL
ALT: 12 U/L (ref 0–44)
AST: 11 U/L — ABNORMAL LOW (ref 15–41)
Albumin: 3.8 g/dL (ref 3.5–5.0)
Alkaline Phosphatase: 80 U/L (ref 38–126)
Anion gap: 9 (ref 5–15)
BUN: 18 mg/dL (ref 8–23)
CO2: 23 mmol/L (ref 22–32)
Calcium: 8.9 mg/dL (ref 8.9–10.3)
Chloride: 110 mmol/L (ref 98–111)
Creatinine, Ser: 0.47 mg/dL (ref 0.44–1.00)
GFR calc Af Amer: >60 mL/min (ref >60)
GFR calc non Af Amer: >60 mL/min (ref >60)
Glucose, Bld: 100 mg/dL — ABNORMAL HIGH (ref 70–99)
Potassium: 4.1 mmol/L (ref 3.5–5.1)
Sodium: 142 mmol/L (ref 135–145)
Total Bilirubin: 0.8 mg/dL (ref 0.3–1.2)
Total Protein: 6.4 g/dL — ABNORMAL LOW (ref 6.5–8.1)

## 2019-03-26 LAB — CBC WITH DIFFERENTIAL/PLATELET
Abs Immature Granulocytes: 0.02 10*3/uL (ref 0.00–0.07)
Basophils Absolute: 0 10*3/uL (ref 0.0–0.1)
Basophils Relative: 1 %
Eosinophils Absolute: 0.1 10*3/uL (ref 0.0–0.5)
Eosinophils Relative: 2 %
HCT: 39.5 % (ref 36.0–46.0)
Hemoglobin: 12.7 g/dL (ref 12.0–15.0)
Immature Granulocytes: 0 %
Lymphocytes Relative: 29 %
Lymphs Abs: 1.7 10*3/uL (ref 0.7–4.0)
MCH: 29.8 pg (ref 26.0–34.0)
MCHC: 32.2 g/dL (ref 30.0–36.0)
MCV: 92.7 fL (ref 80.0–100.0)
Monocytes Absolute: 0.4 10*3/uL (ref 0.1–1.0)
Monocytes Relative: 8 %
Neutro Abs: 3.6 10*3/uL (ref 1.7–7.7)
Neutrophils Relative %: 60 %
Platelets: 207 10*3/uL (ref 150–400)
RBC: 4.26 MIL/uL (ref 3.87–5.11)
RDW: 13.6 % (ref 11.5–15.5)
WBC: 5.9 10*3/uL (ref 4.0–10.5)
nRBC: 0 % (ref 0.0–0.2)

## 2019-03-26 LAB — TROPONIN I (HIGH SENSITIVITY)
Troponin I (High Sensitivity): <2 ng/L (ref <18)
Troponin I (High Sensitivity): 2 ng/L (ref <18)

## 2019-03-26 MED ORDER — SODIUM CHLORIDE 0.9 % IV SOLN
INTRAVENOUS | Status: DC
  Administered 2019-03-26: 10:00:00 via INTRAVENOUS

## 2019-03-26 MED ORDER — ASPIRIN 81 MG PO CHEW
324.0000 mg | CHEWABLE_TABLET | Freq: Once | ORAL | Status: AC
  Administered 2019-03-26: 324 mg via ORAL
  Filled 2019-03-26: qty 4

## 2019-03-26 NOTE — ED Triage Notes (Signed)
Per EMS, pt from home complains of "abnormal sensation" to chest radiating to her left back. Pt states she felt dizzy upon waking, which resolved. Pt states she was lifting things yesterday.  BP 130/66 HR 75 O2 95%

## 2019-03-26 NOTE — Telephone Encounter (Signed)
Pt called stating that she is having a "prickly feeling" and pressure in her chest on the left side into the left back and left arm pain.  Pt denies SOB.  Advised pt that she is to call 911 immediately to be evaluated at ED.  Pt expressed understanding and promised that she would do this immediately after hanging up with me.  Charyl Bigger, CMA

## 2019-03-26 NOTE — Telephone Encounter (Signed)
Called pt back to ensure that she had called 911 since she lives just down the street from our office and have not heard any sirens.  Spoke with pt's spouse who states that pt did not inform him that she was instructed to call 911 and that he planned to transport her by car.  Advised pt's spouse that I still highly suggested that he call 911 for transport/monitoring as her current symptoms could be cardiac related and that cardiac pts can rapidly deteriorate and require assistance of trained medical personnel.  Spouse expressed understanding and stated that he "would make that call" to 911.  Charyl Bigger, CMA

## 2019-03-26 NOTE — ED Provider Notes (Signed)
[facility] DEPT Provider Note   CSN: [id number] Arrival date & time: 03/26/19  0932     History   Chief Complaint Chief Complaint  Patient presents with  . Chest Pain    HPI Mikayla Washington is a 73 y.o. female.     73 year old female who presents with intermittent chest discomfort since yesterday.  States that she was lifting some change yesterday and felt a prickly type sensation in her left chest that went to her left arm.  It lasted for couple seconds and was not associated with diaphoresis, dyspnea.  Occurred today when she woke up similar sensation although this time lasted longer and associated with dizziness.  No prior history of same.  Symptoms have since resolved.  She is unsure how long it lasted for.  Called her doctor and told to come here.     Past Medical History:  Diagnosis Date  . Adenomatous colon polyp 2013   Dr Sharlett Iles  . Breast disease    fibrocystic  . DJD (degenerative joint disease)     Patient Active Problem List   Diagnosis Date Noted  . Atypical nevi * 2 10/03/2018  . Prediabetes 09/27/2018  . Irregular pigmentation of skin 09/27/2018  . Cough in adult 05/14/2018  . Productive cough 11/22/2016  . Acute maxillary sinusitis 11/22/2016  . Status post hysterectomy with oophorectomy in 2000 02/22/2016  . Hemorrhoids 02/22/2016  . Elevated HDL 02/22/2016  . Adopted person 02/22/2016  . Allergic rhinitis 11/28/2014  . Eustachian tube dysfunction 05/31/2014  . Liver cyst 02/18/2014  . Chronic constipation 01/11/2014  . HLD (hyperlipidemia) 11/26/2013  . Hx of adenomatous colonic polyps 11/02/2012  . Vitamin D deficiency 11/02/2012  . Osteopenia 11/02/2012  . Irritable bowel syndrome (IBS) 11/02/2012  . Tinnitus, subjective 11/02/2012  . Menopausal and postmenopausal disorder 11/03/2008  . Osteoarthritis 11/03/2008    Past Surgical History:  Procedure Laterality Date  . ABDOMINAL HYSTERECTOMY    . BREAST  LUMPECTOMY     X 3  . COLONOSCOPY  2002    Dr Sharlett Iles, negative  . colonoscopy with polypectomy  2013   adenomatous polyp  . TONSILLECTOMY    . TOTAL ABDOMINAL HYSTERECTOMY W/ BILATERAL SALPINGOOPHORECTOMY  2001    benign ovarian tumor (BSO also)     OB History   No obstetric history on file.      Home Medications    Prior to Admission medications   Medication Sig Start Date End Date Taking? Authorizing Provider  Calcium-Magnesium-Vitamin D (CALCIUM 1200+D3 PO) Take 1 tablet by mouth daily.    [provider]  fluticasone (FLONASE) 50 MCG/ACT nasal spray Place 2 sprays into both nostrils daily. 05/14/18   Danford, Valetta Fuller D, NP  Multiple Vitamins-Minerals (CENTRUM SILVER PO) Take 1 tablet by mouth daily.    [provider]  Vitamin D, Ergocalciferol, (DRISDOL) 1.25 MG (50000 UT) CAPS capsule Take one tablet wkly 09/27/18   Mellody Dance, DO    Family History Family History  Adopted: Yes    Social History Social History   Tobacco Use  . Smoking status: Never Smoker  . Smokeless tobacco: Never Used  Substance Use Topics  . Alcohol use: No    Comment: rare, 1 x per year or at celebrations   . Drug use: No     Allergies   Patient has no known allergies.   Review of Systems Review of Systems  All other systems reviewed and are negative.  Physical Exam Updated Vital Signs BP (!) 134/92 (BP Location: Left Arm)   Pulse 83   Temp 97.9 F (36.6 C) (Oral)   Resp 13   SpO2 97%   Physical Exam Vitals signs and nursing note reviewed.  Constitutional:      General: She is not in acute distress.    Appearance: Normal appearance. She is well-developed. She is not toxic-appearing.  HENT:     Head: Normocephalic and atraumatic.  Eyes:     General: Lids are normal.     Conjunctiva/sclera: Conjunctivae normal.     Pupils: Pupils are equal, round, and reactive to light.  Neck:     Musculoskeletal: Normal range of motion and neck supple.      Thyroid: No thyroid mass.     Trachea: No tracheal deviation.  Cardiovascular:     Rate and Rhythm: Normal rate and regular rhythm.     Heart sounds: Normal heart sounds. No murmur. No gallop.   Pulmonary:     Effort: Pulmonary effort is normal. No respiratory distress.     Breath sounds: Normal breath sounds. No stridor. No decreased breath sounds, wheezing, rhonchi or rales.  Abdominal:     General: Bowel sounds are normal. There is no distension.     Palpations: Abdomen is soft.     Tenderness: There is no abdominal tenderness. There is no rebound.  Musculoskeletal: Normal range of motion.        General: No tenderness.  Skin:    General: Skin is warm and dry.     Findings: No abrasion or rash.  Neurological:     Mental Status: She is alert and oriented to person, place, and time.     GCS: GCS eye subscore is 4. GCS verbal subscore is 5. GCS motor subscore is 6.     Cranial Nerves: No cranial nerve deficit.     Sensory: No sensory deficit.  Psychiatric:        Speech: Speech normal.        Behavior: Behavior normal.      ED Treatments / Results  Labs (all labs ordered are listed, but only abnormal results are displayed) Labs Reviewed  CBC WITH DIFFERENTIAL/PLATELET  COMPREHENSIVE METABOLIC PANEL  TROPONIN I (HIGH SENSITIVITY)    EKG EKG Interpretation  Date/Time:  Tuesday March 26 2019 09:50:56 EST Ventricular Rate:  80 PR Interval:    QRS Duration: 78 QT Interval:  386 QTC Calculation: 446 R Axis:   76 Text Interpretation: Sinus rhythm Consider left atrial enlargement No old tracing to compare Confirmed by Lacretia Leigh ([id number]) on 03/26/2019 9:54:08 AM   Radiology No results found.  Procedures Procedures (including critical care time)  Medications Ordered in ED Medications  aspirin chewable tablet 324 mg (has no administration in time range)  0.9 %  sodium chloride infusion (has no administration in time range)     Initial Impression / Assessment  and Plan / ED Course  I have reviewed the triage vital signs and the nursing notes.  Pertinent labs & imaging results that were available during my care of the patient were reviewed by me and considered in my medical decision making (see chart for details).        Patient's delta troponin here negative.  EKG without acute ischemic changes.  Labs are reassuring.  Patient upon further questioning states that she has had persistent symptoms of this pinprick sensation across her chest while she has been here.  Work-up is reassuring.  Return precautions given  Final Clinical Impressions(s) / ED Diagnoses   Final diagnoses:  Chest pain    ED Discharge Orders    None       Lacretia Leigh, MD 03/26/19 1410

## 2019-03-29 ENCOUNTER — Encounter: Payer: Self-pay | Admitting: Family Medicine

## 2019-03-29 ENCOUNTER — Ambulatory Visit (INDEPENDENT_AMBULATORY_CARE_PROVIDER_SITE_OTHER): Payer: Medicare Other | Admitting: Family Medicine

## 2019-03-29 ENCOUNTER — Other Ambulatory Visit: Payer: Self-pay

## 2019-03-29 VITALS — Wt 130.0 lb

## 2019-03-29 DIAGNOSIS — Z09 Encounter for follow-up examination after completed treatment for conditions other than malignant neoplasm: Secondary | ICD-10-CM | POA: Diagnosis not present

## 2019-03-29 DIAGNOSIS — E7889 Other lipoprotein metabolism disorders: Secondary | ICD-10-CM

## 2019-03-29 DIAGNOSIS — Z7189 Other specified counseling: Secondary | ICD-10-CM | POA: Insufficient documentation

## 2019-03-29 DIAGNOSIS — R202 Paresthesia of skin: Secondary | ICD-10-CM | POA: Diagnosis not present

## 2019-03-29 DIAGNOSIS — E785 Hyperlipidemia, unspecified: Secondary | ICD-10-CM | POA: Diagnosis not present

## 2019-03-29 DIAGNOSIS — E559 Vitamin D deficiency, unspecified: Secondary | ICD-10-CM

## 2019-03-29 DIAGNOSIS — R7303 Prediabetes: Secondary | ICD-10-CM

## 2019-03-29 NOTE — Progress Notes (Signed)
Telehealth office visit note for Mikayla Washington, D.O- at Primary Care at [facility]   I connected with current patient today and verified that I am speaking with the correct person using two identifiers.   . Location of the patient: Home . Location of the provider: Office Only the patient (+/- their family members at pt's discretion) and myself were participating in the encounter - This visit type was conducted due to national recommendations for restrictions regarding the COVID-19 Pandemic (e.g. social distancing) in an effort to limit this patient's exposure and mitigate transmission in our community.  This format is felt to be most appropriate for this patient at this time.   - The patient did not have access to video technology or had technical difficulties with video requiring transitioning to audio format only. - No physical exam could be performed with this format, beyond that communicated to [place] by the patient/ family members as noted.   - Additionally my office staff/ schedulers discussed with the patient that there may be a monetary charge related to this service, depending on their medical insurance.   The patient expressed understanding, and agreed to proceed.       History of Present Illness: I, Toni Amend, am serving as scribe for Dr. Mellody Washington.   - Lifestyle During COVID Says since she's been wearing a mask this year due to COVID-19, she hasn't experienced any bronchitis.  Notes she carries masks with her often and offers them to folks at the grocery store if she sees people without them.  Denies recent exposure to COVID-19.  Notes she did have an aunt in [place] that recently passed after COVID-19 infection.   - [place] taking Centrum Silver daily.  She does not check her blood pressure at home.  Her vitals in the ER recently included a BP of 134/92 with pulse of 83, and pulse ox at 97%.  Her physical exam was completely  negative.  States she had her mammogram recently and it was negative.  Notes she wishes to continue colonoscopies if possible because she had polyps removed in the past.   - Vitamin D Deficiency Notes she continues supplementation as prescribed. States "I feel okay; I hadn't noticed any problems or anything."   - Recent ED Visit after "Prickly Feeling" and Pressure in the Chest Patient notes she got up on Tuesday morning, December 8, and "just felt terrible."  Notes she didn't have "a temperature or congestion or anything like that, but just felt like there was a weight on my heart" and "little knives going down my chest and across my back and across my left arm."  She called in here to the clinic, and was directed to go for emergency attention, which she obtained.   HPI from ED Visit 12/082020 "73 year old female who presents with intermittent chest discomfort since yesterday.  States that she was lifting some change yesterday and felt a prickly type sensation in her left chest that went to her left arm.  It lasted for couple seconds and was not associated with diaphoresis, dyspnea.  Occurred today when she woke up similar sensation although this time lasted longer and associated with dizziness.  No prior history of same.  Symptoms have since resolved.  She is unsure how long it lasted for.  Called her doctor and told to come here."  Patient notes Dr. Zenia Resides told her that everything looked good, so "I don't know what was going on."  Says she now feels a little bad emotionally because she worried the people at the hospital, adding "you feel kind of guilty if there's not really anything wrong," because "you don't want to take people's time."  Says she was reading her results from the ED visit, and feels reassured by her heart and lungs being healthy.  Denies ongoing symptoms or any ongoing tingling/prickly/chest pressure sensation.  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  - Patient denies any acute concerns or problems with management plan.  - She denies new onset of: myalgias, arthralgias, increased fatigue more than normal, chest pains, exercise intolerance, shortness of breath, dizziness, visual changes, headache, lower extremity swelling or claudication.   Most recent cholesterol panel was:  Lab Results  Component Value Date   CHOL 164 03/28/2018   HDL 62 03/28/2018   LDLCALC 91 03/28/2018   LDLDIRECT 124.2 11/02/2012   TRIG 56 03/28/2018   CHOLHDL 2.6 03/28/2018   Hepatic Function Latest Ref Rng & Units 03/26/2019 03/28/2018 03/24/2016  Total Protein 6.5 - 8.1 g/dL 6.4(L) 6.2 7.0  Albumin 3.5 - 5.0 g/dL 3.8 4.3 4.5  AST 15 - 41 U/L 11(L) 16 15  ALT 0 - 44 U/L _0 Alk Phosphatase 38 - 126 U/L 80 91 82  Total Bilirubin 0.3 - 1.2 mg/dL 0.8 0.4 0.5  Bilirubin, Direct 0.0 - 0.3 mg/dL - - -    BP Readings from Last 3 Encounters:  03/26/19 122/66  10/03/18 94/63  05/28/18 112/72   Filed Weights   03/29/19 0908  Weight: 130 lb (59 kg)    GAD 7 : Generalized Anxiety Score 09/27/2018  Nervous, Anxious, on Edge 0  Control/stop worrying 0  Worry too much - different things 0  Trouble relaxing 0  Restless 0  Easily annoyed or irritable 0  Afraid - awful might happen 0  Total GAD 7  Score 0  Anxiety Difficulty Not difficult at all    Depression screen [facility] 2/9 03/29/2019 09/27/2018 05/28/2018 05/14/2018 03/28/2018  Decreased Interest 0 0 0 0 0  Down, Depressed, Hopeless 0 0 0 0 0  PHQ - 2 Score 0 0 0 0 0  Altered sleeping 1 0 0 0 0  Tired, decreased energy 0 0 0 0 0  Change in appetite 0 0 0 0 0  Feeling bad or failure about yourself  0 0 0 0 0  Trouble concentrating 0 0 0 0 0  Moving slowly or fidgety/restless 0 0 0 0 0  Suicidal thoughts 0 0 0 0 0  PHQ-9 Score 1 0 0 0 0  Difficult doing work/chores Not difficult at all Not difficult at all Not difficult at all Not difficult at all Not difficult at all      Impression and Recommendations:  1. Hospital discharge follow-up   2. Tingling sensation chest into L arm   3. Hyperlipidemia, unspecified hyperlipidemia type   4. Elevated HDL   5. Prediabetes   6. Vitamin D deficiency   7. Counseled about COVID-19 virus infection      Hospital Discharge Follow-Up - Tingling Sensation Chest Into L Arm - Patient recently seen in ED for concerning symptoms, including "pinprick sensation" across her chest which continued while in the ED.  Patient was reassured in ED that workup was negative.   - Troponin found to be negative.  EKG was reassuring w/out acute ischemic changes.  Imaging and labs (CBC, CMP, Chest X-ray, EKG, Troponin levels) were drawn and all reassuring.  Vitals in the ER included BP of 134/92 with pulse of 83, pulse ox of 97%, and physical exam was completely negative.  - Patient was told to follow up with PCP after evaluation. - Reviewed recent symptoms extensively with patient today. - Education provided and all questions answered.   Initial Impression / Assessment and Plan / ED Course 03/26/2019 "I have reviewed the triage vital signs and the nursing notes.  Pertinent labs & imaging results that were available during my care of the patient were reviewed by me and considered in my medical decision  making (see chart for details).  Patient's delta troponin here negative.  EKG without acute ischemic changes.  Labs are reassuring.  Patient upon further questioning states that she has had persistent symptoms of this pinprick sensation across her chest while she has been here.  Work-up is reassuring.  Return precautions given."  - Red flag symptoms and signs discussed in detail.  Patient knows to watch especially for symptoms such as chest pain or pressure that comes on with activity/exertion and is alleviated with rest.  Patient expressed understanding regarding what to do in case of emergency\ urgent symptoms.    - The patient was advised to call back or seek an in-person evaluation if the symptoms worsen or if the condition fails to improve as anticipated.  - To help reduce risk of future incidence of cardiac event, reviewed critical need to control patient's cholesterol, blood pressure, and blood sugar.   Hyperlipidemia, Elevated HDL Last FLP obtained 03/28/2018: - Total Cholesterol = 164. - LDL = 91, down from 98 prior. - HDL = 62, down from 69 prior. - Triglycerides = 56, down from 84 prior.  The 10-year ASCVD risk score Mikey Bussing DC Brooke Bonito., et al., 2013) is: 11.5%   Values used to calculate the score:     Age: 75 years     Sex: Female     Is Non-Hispanic African American: No     Diabetic: No     Tobacco smoker: No     Systolic Blood Pressure: 381 mmHg     Is BP treated: No     HDL Cholesterol: 62 mg/dL     Total Cholesterol: 164 mg/dL  - Reviewed that as both blood pressure and cholesterol increase, patient's ASCVD risk increases.  - Reviewed that cholesterol levels are currently suboptimally controlled. - Discussed option of beginning prescription intervention in the future.  - Prudent dietary changes such as low saturated & trans fat diets for hyperlipidemia and low carb diets for hypertriglyceridemia discussed with patient.    - Encouraged patient to follow AHA guidelines  for regular exercise and also engage in weight loss if BMI above 25.   - Educational handouts provided at patient's desire and/ or told to look online  at the [employer] website for further information.  - We will continue to monitor and re-check as discussed.   Prediabetes - A1c 5.8 last check.  - Counseled patient on prevention of diabetes and discussed dietary and lifestyle modification as first line.   - Per patient, husband has diabetes and she understands appropriate lifestyle changes.  - Importance of low carb, heart-healthy diet discussed with patient in addition to regular aerobic exercise of 80mn 5d/week or more.   - Will continue to monitor and re-check as discussed.   Vitamin D Deficiency - Measured at 27.3 last check one year ago. - Continue supplementation as established.  See med list. - Will continue to monitor and re-check as discussed.   Counseled about COVID-19 virus infection - Novel Covid -19 counseling done; all questions were answered.   - Current CDC / federal and [place] guidelines reviewed with patient  - Reminded pt of extreme importance of social distancing; wearing a mask when out in public; insensate handwashing and cleaning of surfaces, avoiding unnecessary trips for shopping and avoiding ALL but emergency appts etc. - Told patient to be prepared, not scared; and be smart for the sake of others - Patient will call with any additional concerns   Recommendations - Due for yearly physical/Medicare Wellness near future. - Last Medicare Wellness obtained 03/28/2018. - Need for full fasting lab work near future.  - Patient knows to call front desk to arrange upcoming appointments.   - As part of my medical decision making, I reviewed the following data [place] obtained from pt /family, CMA notes reviewed and incorporated if applicable, Labs reviewed, Radiograph/ tests reviewed if applicable and  OV notes from prior OV's with me, as well as other specialists she/he has seen since seeing me last, were all reviewed and used in my medical decision making process today.    - Additionally, discussion had with patient regarding our treatment plan, and their biases/concerns about that plan were used in my medical decision making today.    - The patient agreed with the plan and demonstrated an understanding of the instructions.   No barriers to understanding were identified.    Return for Medicare Wellness & full fasting lab work near future.   I provided 25 minutes of non face-to-face time during this encounter.  Additional time was spent with charting and coordination of care after the actual visit commenced.   Note:  This note was prepared with assistance of Dragon voice recognition software. Occasional wrong-word or sound-a-like substitutions may have occurred due to the inherent limitations of voice recognition software.  This document serves as a record of services personally performed by DMellody Dance DO. It was created on her behalf by KToni Amend a trained medical scribe. The creation of this record is based on the scribe's personal observations and the provider's statements to them.   This case required medical decision making of at least moderate complexity. The above documentation has been reviewed to be accurate and was completed by DMarjory Sneddon D.O.      Patient Care Team    Relationship Specialty Notifications Start End  OMellody Dance DO PCP - General Family Medicine  02/22/16   DCalvert Cantor MD Consulting Physician Ophthalmology  02/22/16   PSable Feil MD Consulting Physician Gastroenterology  02/22/16    Comment: LRudolpho Sevin    -Vitals obtained; medications/ allergies reconciled;  personal medical, social, Sx etc.histories were updated by  CMA, reviewed by me and are reflected in chart   Patient Active Problem List   Diagnosis Date Noted   . Prediabetes 09/27/2018  . Elevated HDL 02/22/2016  . HLD (hyperlipidemia) 11/26/2013  . Adopted person 02/22/2016  . Vitamin D deficiency 11/02/2012  . Counseled about COVID-19 virus infection 03/29/2019  . Tingling sensation chest into L arm 03/29/2019  . Atypical nevi * 2 10/03/2018  . Irregular pigmentation of skin 09/27/2018  . Cough in adult 05/14/2018  . Productive cough 11/22/2016  . Acute maxillary sinusitis 11/22/2016  . Status post hysterectomy with oophorectomy in 2000 02/22/2016  . Hemorrhoids 02/22/2016  . Allergic rhinitis 11/28/2014  . Eustachian tube dysfunction 05/31/2014  . Liver cyst 02/18/2014  . Chronic constipation 01/11/2014  . Hx of adenomatous colonic polyps 11/02/2012  . Osteopenia 11/02/2012  . Irritable bowel syndrome (IBS) 11/02/2012  . Tinnitus, subjective 11/02/2012  . Menopausal and postmenopausal disorder 11/03/2008  . Osteoarthritis 11/03/2008     Current Meds  Medication Sig  . aspirin-acetaminophen-caffeine (EXCEDRIN MIGRAINE) 250-250-65 MG tablet Take 2 tablets by mouth every 6 (six) hours as needed for headache.  . Multiple Vitamins-Minerals (CENTRUM SILVER PO) Take 1 tablet by mouth daily.  . Vitamin D, Ergocalciferol, (DRISDOL) 1.25 MG (50000 UT) CAPS capsule Take one tablet wkly (Patient taking differently: Take 50,000 Units by mouth every 7 (seven) days. Take one tablet wkly)     Allergies:  No Known Allergies   ROS:  See above HPI for pertinent positives and negatives   Objective:   Weight 130 lb (59 kg).  (if some vitals are omitted, this means that patient was UNABLE to obtain them even though they were asked to get them prior to OV today.  They were asked to call us at their earliest convenience with these once obtained. )  General: A & O * 3; sounds in no acute distress; in usual state of health.  Skin: Pt confirms warm and dry extremities and pink fingertips HEENT: Pt confirms lips non-cyanotic Chest: Patient  confirms normal chest excursion and movement Respiratory: speaking in full sentences, no conversational dyspnea; patient confirms no use of accessory muscles Psych: insight appears good, mood- appears full

## 2019-04-02 ENCOUNTER — Other Ambulatory Visit: Payer: Self-pay

## 2019-04-02 ENCOUNTER — Other Ambulatory Visit: Payer: Medicare Other

## 2019-04-02 DIAGNOSIS — E559 Vitamin D deficiency, unspecified: Secondary | ICD-10-CM

## 2019-04-02 DIAGNOSIS — Z0000 Encounter for general adult medical examination without abnormal findings: Secondary | ICD-10-CM

## 2019-04-02 DIAGNOSIS — E7889 Other lipoprotein metabolism disorders: Secondary | ICD-10-CM

## 2019-04-02 DIAGNOSIS — E785 Hyperlipidemia, unspecified: Secondary | ICD-10-CM

## 2019-04-02 DIAGNOSIS — R7303 Prediabetes: Secondary | ICD-10-CM

## 2019-04-02 DIAGNOSIS — M8589 Other specified disorders of bone density and structure, multiple sites: Secondary | ICD-10-CM

## 2019-04-03 LAB — COMPREHENSIVE METABOLIC PANEL
ALT: 10 IU/L (ref 0–32)
AST: 13 IU/L (ref 0–40)
Albumin/Globulin Ratio: 2.3 — ABNORMAL HIGH (ref 1.2–2.2)
Albumin: 4.4 g/dL (ref 3.7–4.7)
Alkaline Phosphatase: 99 IU/L (ref 39–117)
BUN/Creatinine Ratio: 24 (ref 12–28)
BUN: 17 mg/dL (ref 8–27)
Bilirubin Total: 0.3 mg/dL (ref 0.0–1.2)
CO2: 24 mmol/L (ref 20–29)
Calcium: 9.5 mg/dL (ref 8.7–10.3)
Chloride: 101 mmol/L (ref 96–106)
Creatinine, Ser: 0.72 mg/dL (ref 0.57–1.00)
GFR calc Af Amer: 96 mL/min/{1.73_m2} (ref >59)
GFR calc non Af Amer: 83 mL/min/{1.73_m2} (ref >59)
Globulin, Total: 1.9 g/dL (ref 1.5–4.5)
Glucose: 86 mg/dL (ref 65–99)
Potassium: 4.3 mmol/L (ref 3.5–5.2)
Sodium: 140 mmol/L (ref 134–144)
Total Protein: 6.3 g/dL (ref 6.0–8.5)

## 2019-04-03 LAB — CBC WITH DIFFERENTIAL/PLATELET
Basophils Absolute: 0 10*3/uL (ref 0.0–0.2)
Basos: 1 %
EOS (ABSOLUTE): 0.2 10*3/uL (ref 0.0–0.4)
Eos: 3 %
Hematocrit: 38 % (ref 34.0–46.6)
Hemoglobin: 12.8 g/dL (ref 11.1–15.9)
Immature Grans (Abs): 0 10*3/uL (ref 0.0–0.1)
Immature Granulocytes: 0 %
Lymphocytes Absolute: 2.1 10*3/uL (ref 0.7–3.1)
Lymphs: 34 %
MCH: 29.7 pg (ref 26.6–33.0)
MCHC: 33.7 g/dL (ref 31.5–35.7)
MCV: 88 fL (ref 79–97)
Monocytes Absolute: 0.6 10*3/uL (ref 0.1–0.9)
Monocytes: 9 %
Neutrophils Absolute: 3.4 10*3/uL (ref 1.4–7.0)
Neutrophils: 53 %
Platelets: 217 10*3/uL (ref 150–450)
RBC: 4.31 x10E6/uL (ref 3.77–5.28)
RDW: 13.4 % (ref 11.7–15.4)
WBC: 6.3 10*3/uL (ref 3.4–10.8)

## 2019-04-03 LAB — HEMOGLOBIN A1C
Est. average glucose Bld gHb Est-mCnc: 117 mg/dL
Hgb A1c MFr Bld: 5.7 % — ABNORMAL HIGH (ref 4.8–5.6)

## 2019-04-03 LAB — LIPID PANEL
Chol/HDL Ratio: 2.6 ratio (ref 0.0–4.4)
Cholesterol, Total: 171 mg/dL (ref 100–199)
HDL: 65 mg/dL (ref >39)
LDL Chol Calc (NIH): 94 mg/dL (ref 0–99)
Triglycerides: 60 mg/dL (ref 0–149)
VLDL Cholesterol Cal: 12 mg/dL (ref 5–40)

## 2019-04-03 LAB — VITAMIN D 25 HYDROXY (VIT D DEFICIENCY, FRACTURES): Vit D, 25-Hydroxy: 68.3 ng/mL (ref 30.0–100.0)

## 2019-04-03 LAB — T3: T3, Total: 127 ng/dL (ref 71–180)

## 2019-04-03 LAB — T4, FREE: Free T4: 1.29 ng/dL (ref 0.82–1.77)

## 2019-04-03 LAB — TSH: TSH: 1.94 u[IU]/mL (ref 0.450–4.500)

## 2019-04-04 ENCOUNTER — Encounter: Payer: Self-pay | Admitting: Family Medicine

## 2019-04-04 ENCOUNTER — Ambulatory Visit (INDEPENDENT_AMBULATORY_CARE_PROVIDER_SITE_OTHER): Payer: Medicare Other | Admitting: Family Medicine

## 2019-04-04 ENCOUNTER — Other Ambulatory Visit: Payer: Self-pay

## 2019-04-04 VITALS — Wt 130.0 lb

## 2019-04-04 DIAGNOSIS — R7303 Prediabetes: Secondary | ICD-10-CM

## 2019-04-04 DIAGNOSIS — Z86010 Personal history of colonic polyps: Secondary | ICD-10-CM

## 2019-04-04 DIAGNOSIS — E785 Hyperlipidemia, unspecified: Secondary | ICD-10-CM | POA: Diagnosis not present

## 2019-04-04 DIAGNOSIS — Z13820 Encounter for screening for osteoporosis: Secondary | ICD-10-CM

## 2019-04-04 DIAGNOSIS — E559 Vitamin D deficiency, unspecified: Secondary | ICD-10-CM

## 2019-04-04 DIAGNOSIS — E7889 Other lipoprotein metabolism disorders: Secondary | ICD-10-CM | POA: Diagnosis not present

## 2019-04-04 DIAGNOSIS — Z0000 Encounter for general adult medical examination without abnormal findings: Secondary | ICD-10-CM

## 2019-04-04 DIAGNOSIS — Z7189 Other specified counseling: Secondary | ICD-10-CM | POA: Diagnosis not present

## 2019-04-04 DIAGNOSIS — M810 Age-related osteoporosis without current pathological fracture: Secondary | ICD-10-CM | POA: Insufficient documentation

## 2019-04-04 DIAGNOSIS — E2839 Other primary ovarian failure: Secondary | ICD-10-CM | POA: Diagnosis not present

## 2019-04-04 MED ORDER — ATORVASTATIN CALCIUM 40 MG PO TABS: 40.0000 mg | ORAL_TABLET | Freq: Every day | ORAL | 1 refills | Status: DC

## 2019-04-04 NOTE — Progress Notes (Signed)
Subjective:   Mikayla Washington is a 73 y.o. female who presents for Medicare Annual (Subsequent) preventive examination.  HPI: Says she thinks everything is going well with her overall, and "that Vitamin D medicine seems to be helping."   Patient wonders when she may be able to obtain the COVID-19 vaccine in the upcoming months. Notes she is concerned about herself and her husband since they are both over the age of 34, and her husband recently had a pacemaker placed.   States she has concerns about her upcoming colonoscopy in 2023 because she has had several polyps in the past, wonders if she needs to go sooner.   Done by Dr Hilarie Fredrickson   Pt has questions about recent labs that were done despite me sending her a mychart message earlier.   - Elevated LDL- The 10-year ASCVD risk score Mikey Bussing DC Jr., et al., 2013) is: 11.5%   Values used to calculate the score:     Age: 12 years     Sex: Female     Is Non-Hispanic African American: No     Diabetic: No     Tobacco smoker: No     Systolic Blood Pressure: 123XX123 mmHg     Is BP treated: No     HDL Cholesterol: 65 mg/dL     Total Cholesterol: 171 mg/dL Patient states she will begin statin if it is absolutely necessary - finally agrees to it after several times in the past trying to get her to go on one/ Notes she has no idea what to expect out of a statin and no assumptions/preconceived notions about them.  Her husband is managed on a statin because he is diabetic- lipitor.    States during her recent incident of hospitalization, they checked her heart and lungs as well. Patient continues to deny red flag cardiac symptoms. She denies chest pain or tightness, or symptoms coming on with activity that are relieved with rest.  She does not see a cardiologist.  Notes she was told in the past that she has an irregular heartbeat and "mitral valve problem."  Remains Asx   Review of Systems: General:   Denies fever, chills, unexplained weight loss.   Optho/Auditory:   Denies visual changes, blurred vision/LOV Respiratory:   Denies SOB, DOE more than baseline levels.  Cardiovascular:   Denies chest pain, palpitations, new onset peripheral edema  Gastrointestinal:   Denies nausea, vomiting, diarrhea.  Genitourinary: Denies dysuria, freq/ urgency, flank pain or discharge from genitals.  Endocrine:     Denies hot or cold intolerance, polyuria, polydipsia. Musculoskeletal:   Denies unexplained myalgias, joint swelling, unexplained arthralgias, gait problems.  Skin:  Denies rash, suspicious lesions Neurological:     Denies dizziness, unexplained weakness, numbness  Psychiatric/Behavioral:   Denies mood changes, suicidal or homicidal ideations, hallucinations     Objective:    Vitals: Wt 130 lb (59 kg)   BMI 23.03 kg/m   Body mass index is 23.03 kg/m.   Tobacco Social History   Tobacco Use  Smoking Status Never Smoker  Smokeless Tobacco Never Used      Past Medical History:  Diagnosis Date  . Adenomatous colon polyp 2013   Dr Sharlett Iles  . Breast disease    fibrocystic  . DJD (degenerative joint disease)     Past Surgical History:  Procedure Laterality Date  . ABDOMINAL HYSTERECTOMY    . BREAST LUMPECTOMY     X 3  . COLONOSCOPY  2002  Dr Sharlett Iles, negative  . colonoscopy with polypectomy  2013   adenomatous polyp  . TONSILLECTOMY    . TOTAL ABDOMINAL HYSTERECTOMY W/ BILATERAL SALPINGOOPHORECTOMY  2001    benign ovarian tumor (BSO also)    Family History  Adopted: Yes    Social History   Socioeconomic History  . Marital status: Married    Spouse name: Not on file  . Number of children: Not on file  . Years of education: Not on file  . Highest education level: Not on file  Occupational History  . Not on file  Tobacco Use  . Smoking status: Never Smoker  . Smokeless tobacco: Never Used  Substance and Sexual Activity  . Alcohol use: No    Comment: rare, 1 x per year or at celebrations   . Drug use:  No  . Sexual activity: Yes    Birth control/protection: Surgical  Other Topics Concern  . Not on file  Social History Narrative  . Not on file   Social Determinants of Health   Financial Resource Strain:   . Difficulty of Paying Living Expenses: Not on file  Food Insecurity:   . Worried About Charity fundraiser in the Last Year: Not on file  . Ran Out of Food in the Last Year: Not on file  Transportation Needs:   . Lack of Transportation (Medical): Not on file  . Lack of Transportation (Non-Medical): Not on file  Physical Activity:   . Days of Exercise per Week: Not on file  . Minutes of Exercise per Session: Not on file  Stress:   . Feeling of Stress : Not on file  Social Connections:   . Frequency of Communication with Friends and Family: Not on file  . Frequency of Social Gatherings with Friends and Family: Not on file  . Attends Religious Services: Not on file  . Active Member of Clubs or Organizations: Not on file  . Attends Archivist Meetings: Not on file  . Marital Status: Not on file     Outpatient Encounter Medications as of 04/04/2019  Medication Sig  . aspirin-acetaminophen-caffeine (EXCEDRIN MIGRAINE) 250-250-65 MG tablet Take 2 tablets by mouth every 6 (six) hours as needed for headache.  . Multiple Vitamins-Minerals (CENTRUM SILVER PO) Take 1 tablet by mouth daily.  . Vitamin D, Ergocalciferol, (DRISDOL) 1.25 MG (50000 UT) CAPS capsule Take one tablet wkly (Patient taking differently: Take 50,000 Units by mouth every 7 (seven) days. Take one tablet wkly)  . atorvastatin (LIPITOR) 40 MG tablet Take 1 tablet (40 mg total) by mouth at bedtime.   No facility-administered encounter medications on file as of 04/04/2019.    Activities of Daily Living In your present state of health, do you have any difficulty performing the following activities: 04/04/2019 03/29/2019  Hearing? N Y  Vision? N N  Difficulty concentrating or making decisions? N N   Walking or climbing stairs? N N  Dressing or bathing? N N  Doing errands, shopping? N N  Some recent data might be hidden     Patient Care Team: Mellody Dance, DO as PCP - General (Family Medicine) Calvert Cantor, MD as Consulting Physician (Ophthalmology) Sable Feil, MD as Consulting Physician (Gastroenterology)    Assessment:   This is a routine wellness examination for Beatrice.   Exercise Activities and Dietary recommendations - discussed AHA guidelines for heart healthy/ mediterranean diet along with AHA goal of at least 150 min/week of moderate intensity aerobic activity.  -  recommend  and discussed weight bearing activities for bone health  - recommend "brain activities" esp something like learning a new language or a new instrument, to help prevent memory issues as pt ages   Fall Risk Fall Risk  04/04/2019 03/28/2018 05/03/2017 01/17/2017 03/24/2016  Falls in the past year? 0 0 No Yes No  Number falls in past yr: 0 - - 1 -  Injury with Fall? 0 - - Yes -  Follow up Falls evaluation completed - - - -   Is the patient's home free of loose throw rugs in walkways, pet beds, electrical cords, etc?   yes      Grab bars in the bathroom? yes      Handrails on the stairs?   yes      Adequate lighting?   yes  Timed Get Up and Go performed: Telehealth   Depression Screen PHQ 2/9 Scores 04/04/2019 03/29/2019 09/27/2018 05/28/2018  PHQ - 2 Score 0 0 0 0  PHQ- 9 Score 0 1 0 0     Cognitive Function 6CIT Screen 04/04/2019 03/28/2018 03/24/2016  What Year? 0 points 0 points 0 points  What month? 0 points 0 points 0 points  What time? 0 points 0 points 0 points  Count back from 20 0 points 0 points 0 points  Months in reverse 0 points 0 points 0 points  Repeat phrase 0 points 0 points 0 points  Total Score 0 0 0     Immunization History  Administered Date(s) Administered  . Influenza, High Dose Seasonal PF 12/23/2017, 12/08/2018  . Influenza,inj,Quad PF,6+ Mos  02/18/2014  . Influenza-Unspecified 01/08/2016, 12/23/2017  . Pneumococcal Conjugate-13 11/28/2014, 02/05/2019  . Pneumococcal Polysaccharide-23 04/09/2007, 02/28/2011  . Td 11/16/2001  . Tdap 03/24/2016  . Zoster 05/07/2007  . Zoster Recombinat (Shingrix) 05/04/2017, 07/14/2017    Qualifies for Shingles Vaccine?UTD Pneumococcal Vaccine: UTD Influenza Vaccine: UTD   Screening Tests Health Maintenance  Topic Date Due  . MAMMOGRAM  02/06/2020  . COLONOSCOPY  03/20/2022  . TETANUS/TDAP  03/24/2026  . INFLUENZA VACCINE  Completed  . DEXA SCAN  Completed  . Hepatitis C Screening  Completed  . PNA vac Low Risk Adult  Completed     Cancer Screenings: Lung: Low Dose CT Chest recommended if Age 73-80 years, 30 pack-year currently smoking OR have quit w/in 15years. Patient does not qualify.  Breast:  Up to date on Mammogram? Yes    Up to date of Bone Density/Dexa? Ordered today  Colorectal: UTD     Additional Screenings: Hepatitis C Screening: Done 03/24/2016  HIV Screening: Done 03/24/2016      Plan:     PLAN:   - Need for updated bone density DEXA scan.   - Obtained mammogram in October 21 of 2020, which was negative. - Patient prefers to keep applicable imaging and screenings at [facility].   - Last obtained colonoscopy in December 2018 with Dr. Hilarie Fredrickson. - Due for repeat December of 2023.   - Counseling provided today regarding COVID-19 moving forward, including expectations for vaccinations. - Health counseling provided and all questions answered.   HLD:    - R/w pt in detail results of recent lab work, patient agrees to begin statin today. The 10-year ASCVD risk score Mikey Bussing DC Brooke Bonito., et al., 2013) is: 11.5%  -  Prescription provided. See med list.  - lipitor script - To minimize risk of side-effects, advised patient to hydrate adequately and engage in regular physical activity.  - heart  healthy, low chol diet d/c pt - Discussed need to repeat FLP and check liver &  kidney function in 2 months after starting statin.   - As discussed last OV after patient's hospital follow-up and negative cardiac work-up, reassured patient that her heart health appears stable.  - However, reviewed critical importance to maintain control over blood pressure, cholesterol, and blood sugar.   I have personally reviewed and noted the following in the patient's chart:   . Medical and social history . Use of alcohol, tobacco or illicit drugs  . Current medications and supplements . Functional ability and status . Nutritional status . Physical activity . Advanced directives . List of other physicians . Hospitalizations, surgeries, and ER visits in previous 12 months . Vitals . Screenings to include cognitive, depression, and falls . Referrals and appointments  In addition, I have reviewed and discussed with patient certain preventive protocols, quality metrics, and best practice recommendations. A written personalized care plan for preventive services as well as general preventive health recommendations were provided to patient.     Mellody Dance, DO  04/07/2019

## 2019-04-04 NOTE — Patient Instructions (Signed)

## 2019-05-16 LAB — HM MAMMOGRAPHY

## 2019-05-31 IMAGING — DX DG CHEST 2V
2 series · 2 of 2 positions shown · non-contrast
Comparison: Chest x-ray of 01/13/2014

CLINICAL DATA: Productive cough

EXAM:
CHEST  2 VIEW

[chest pa]
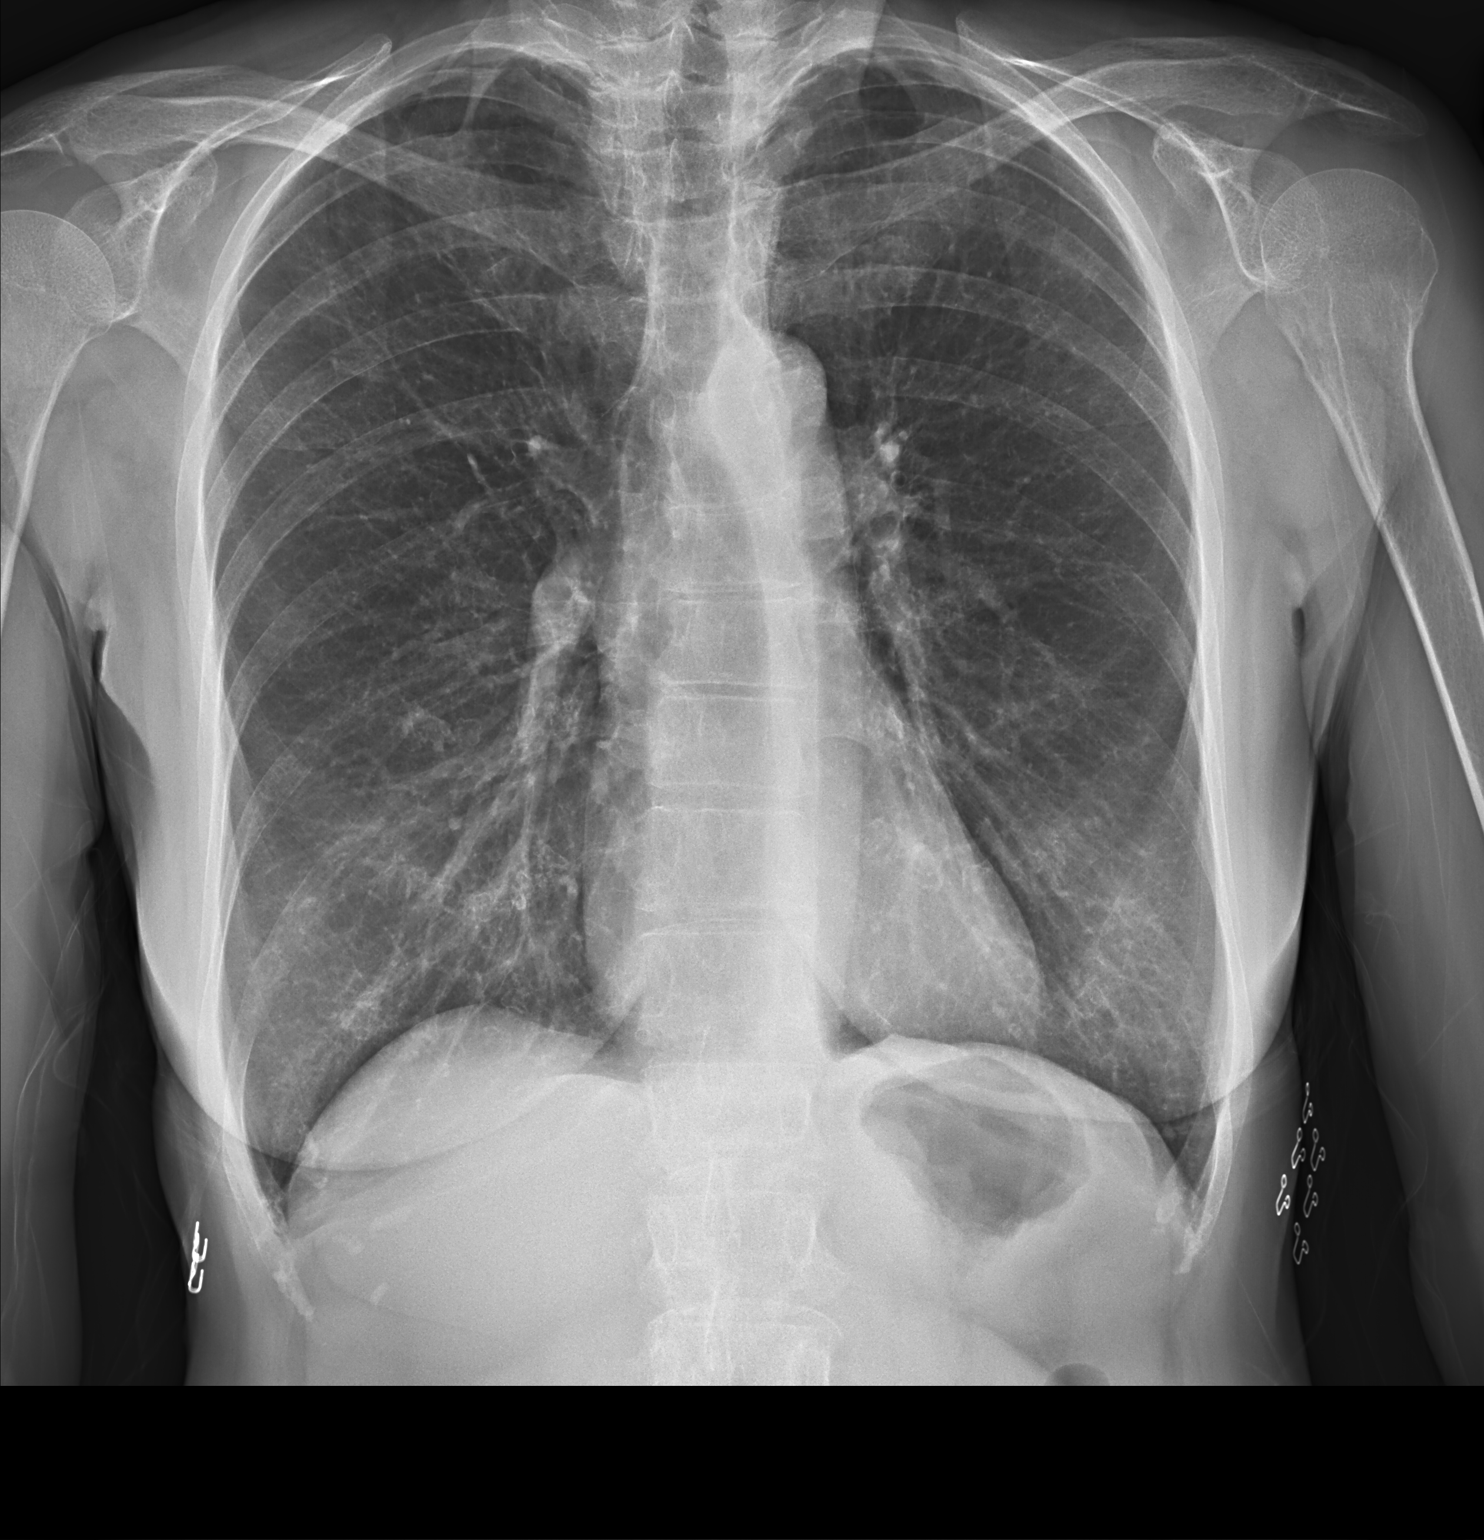

[chest lat]
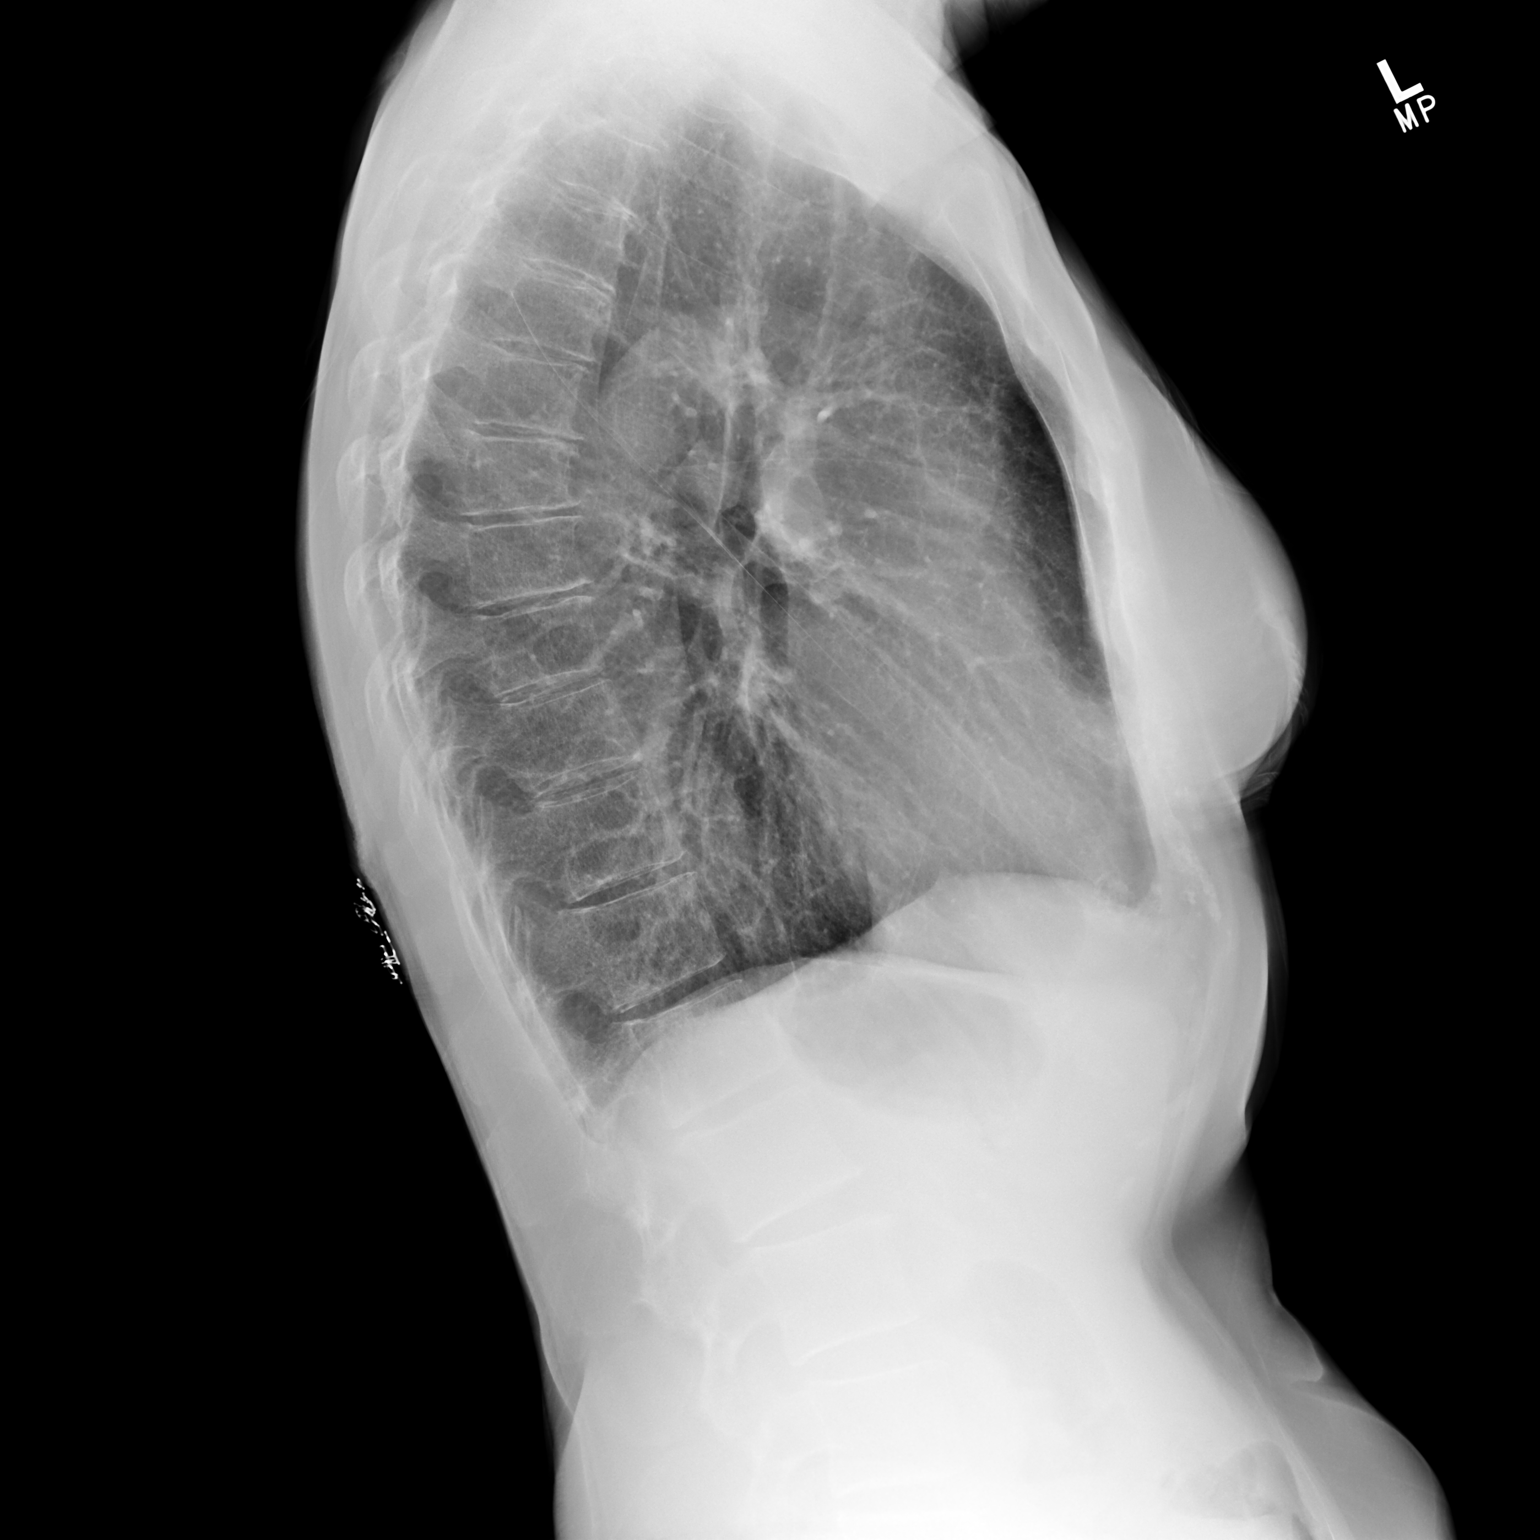

[2 of 2 positions shown; findings below may reference images not displayed]

FINDINGS: No active infiltrate or effusion is seen. The lungs are somewhat
hyperaerated. Mediastinal and hilar contours are unremarkable. The
heart is within normal limits in size. No acute bony abnormality is
seen.
IMPRESSION: Slight hyper aeration.  No active lung disease.

## 2019-06-02 ENCOUNTER — Ambulatory Visit: Payer: Medicare Other

## 2019-06-12 ENCOUNTER — Telehealth: Payer: Self-pay | Admitting: Family Medicine

## 2019-06-12 NOTE — Telephone Encounter (Signed)
Patient called states has Hx of fibroid since having Hysterectomy (does not have a GYN since then but has experiencing some ABD swelling & pain-- informed her provider out of office today & if possible to seek immediate medical attention. No available appt w/ provider tomorrow 2/25 unless there are cancellations--- Pt agreeable gv her contact information to [facility] [place] [facility].  ---Juluis Rainier to med asst.  --glh

## 2019-06-13 ENCOUNTER — Telehealth: Payer: Self-pay | Admitting: Family Medicine

## 2019-06-13 ENCOUNTER — Ambulatory Visit
Admission: EM | Admit: 2019-06-13 | Discharge: 2019-06-13 | Disposition: A | Discharge status: Home / Self Care | Payer: Medicare PPO

## 2019-06-13 DIAGNOSIS — N8110 Cystocele, unspecified: Secondary | ICD-10-CM | POA: Diagnosis not present

## 2019-06-13 NOTE — ED Triage Notes (Signed)
Pt c/o vaginal area pressure x1wk. States has had a hysterectomy in 2000. States "I feel something protruding down there". Pt urinary sx's.

## 2019-06-13 NOTE — Discharge Instructions (Signed)
As discussed, exam is consistent with prolapse. This happens over age, pregnancy, straining, surgery. No signs of infection. Will need to follow up with PCP/GYN for further evaluation and treatment options.

## 2019-06-13 NOTE — ED Provider Notes (Signed)
[facility]    CSN: [id number] Arrival date & time: 06/13/19  1134      History   Chief Complaint Chief Complaint  Patient presents with  . vaginal pressure    HPI Mikayla Washington is a 74 y.o. female.   74 year old female comes in for 1 week history of vaginal pressure. Denies injury, trauma. Denies heavy lifting. Does have constipation at baseline with straining, but denies significant straining out of normal. Denies abdominal pain, nausea, vomiting. denies vaginal pain, discharge, itching rash. Denies urinary frequency, dysuria, hematuria. At baseline, she reports occasionally has hesitation creating urine stream, helps with pushing on abdomen. Also at baseline with urinary urgency, stress incontinence.  Denies any changes to the symptoms. S/p abdominal hysterectomy.      Past Medical History:  Diagnosis Date  . Adenomatous colon polyp 2013   Dr Sharlett Iles  . Breast disease    fibrocystic  . DJD (degenerative joint disease)     Patient Active Problem List   Diagnosis Date Noted  . Estrogen deficiency 04/04/2019  . Postmenopausal bone loss 04/04/2019  . Counseled about COVID-19 virus infection 03/29/2019  . Tingling sensation chest into L arm 03/29/2019  . Atypical nevi * 2 10/03/2018  . Prediabetes 09/27/2018  . Irregular pigmentation of skin 09/27/2018  . Cough in adult 05/14/2018  . Productive cough 11/22/2016  . Acute maxillary sinusitis 11/22/2016  . Status post hysterectomy with oophorectomy in 2000 02/22/2016  . Hemorrhoids 02/22/2016  . Elevated HDL 02/22/2016  . Adopted person 02/22/2016  . Allergic rhinitis 11/28/2014  . Eustachian tube dysfunction 05/31/2014  . Liver cyst 02/18/2014  . Chronic constipation 01/11/2014  . HLD (hyperlipidemia) 11/26/2013  . Hx of adenomatous colonic polyps 11/02/2012  . Vitamin D deficiency 11/02/2012  . Osteopenia 11/02/2012  . Irritable bowel syndrome (IBS) 11/02/2012  . Tinnitus, subjective 11/02/2012  .  Menopausal and postmenopausal disorder 11/03/2008  . Osteoarthritis 11/03/2008    Past Surgical History:  Procedure Laterality Date  . ABDOMINAL HYSTERECTOMY    . BREAST LUMPECTOMY     X 3  . COLONOSCOPY  2002    Dr Sharlett Iles, negative  . colonoscopy with polypectomy  2013   adenomatous polyp  . TONSILLECTOMY    . TOTAL ABDOMINAL HYSTERECTOMY W/ BILATERAL SALPINGOOPHORECTOMY  2001    benign ovarian tumor (BSO also)    OB History   No obstetric history on file.      Home Medications    Prior to Admission medications   Medication Sig Start Date End Date Taking? Authorizing Provider  atorvastatin (LIPITOR) 40 MG tablet Take 1 tablet (40 mg total) by mouth at bedtime. 04/04/19  Yes Opalski, Neoma Laming, DO  aspirin-acetaminophen-caffeine (EXCEDRIN MIGRAINE) [phone] MG tablet Take 2 tablets by mouth every 6 (six) hours as needed for headache.    [provider]  Multiple Vitamins-Minerals (CENTRUM SILVER PO) Take 1 tablet by mouth daily.    [provider]  Vitamin D, Ergocalciferol, (DRISDOL) 1.25 MG (50000 UT) CAPS capsule Take one tablet wkly Patient taking differently: Take 50,000 Units by mouth every 7 (seven) days. Take one tablet wkly 09/27/18   Mellody Dance, DO    Family History Family History  Adopted: Yes    Social History Social History   Tobacco Use  . Smoking status: Never Smoker  . Smokeless tobacco: Never Used  Substance Use Topics  . Alcohol use: No    Comment: rare, 1 x per year or at celebrations   .  Drug use: No     Allergies   Patient has no known allergies.   Review of Systems Review of Systems  Reason unable to perform ROS: See HPI as above.     Physical Exam Triage Vital Signs ED Triage Vitals  Enc Vitals Group     BP 06/13/19 1145 108/68     Pulse Rate 06/13/19 1145 83     Resp 06/13/19 1145 18     Temp 06/13/19 1145 98 F (36.7 C)     Temp Source 06/13/19 1145 Oral     SpO2 06/13/19 1145 98 %     Weight  --      Height --      Head Circumference --      Peak Flow --      Pain Score 06/13/19 1150 0     Pain Loc --      Pain Edu? --      Excl. in [place]? --    No data found.  Updated Vital Signs BP 108/68 (BP Location: Left Arm)   Pulse 83   Temp 98 F (36.7 C) (Oral)   Resp 18   SpO2 98%   Physical Exam Exam conducted with a chaperone present.  Constitutional:      General: She is not in acute distress.    Appearance: Normal appearance. She is well-developed. She is not toxic-appearing or diaphoretic.  HENT:     Head: Normocephalic and atraumatic.  Eyes:     Conjunctiva/sclera: Conjunctivae normal.     Pupils: Pupils are equal, round, and reactive to light.  Pulmonary:     Effort: Pulmonary effort is normal. No respiratory distress.     Comments: Speaking in full sentences without difficulty Abdominal:     General: Bowel sounds are normal.     Palpations: Abdomen is soft.     Tenderness: There is no abdominal tenderness. There is no guarding or rebound.  Genitourinary:    Comments: Vaginal canal with tissue prolapse visible to opening, protruding with cough and abdominal palpation.  Musculoskeletal:     Cervical back: Normal range of motion and neck supple.  Skin:    General: Skin is warm and dry.  Neurological:     Mental Status: She is alert and oriented to person, place, and time.      UC Treatments / Results  Labs (all labs ordered are listed, but only abnormal results are displayed) Labs Reviewed - No data to display  EKG   Radiology No results found.  Procedures Procedures (including critical care time)  Medications Ordered in UC Medications - No data to display  Initial Impression / Assessment and Plan / UC Course  I have reviewed the triage vital signs and the nursing notes.  Pertinent labs & imaging results that were available during my care of the patient were reviewed by me and considered in my medical decision making (see chart for  details).    History and exam consistent with vaginal/bladder prolapse. After discussion, speculum exam was deferred as speculum unable to separate to fully assess for prolapse. Will have patient follow up with GYN/PCP for further evaluation and management needed. Return precautions given. Patient expresses understanding and agrees to plan.  Final Clinical Impressions(s) / UC Diagnoses   Final diagnoses:  Female bladder prolapse    ED Prescriptions    None     PDMP not reviewed this encounter.   Ok Edwards, PA-C 06/13/19 1345

## 2019-06-13 NOTE — Telephone Encounter (Signed)
Pt has [facility] referral must be to a in network provider (pt has located [facility] request to med asst & referral Coord.  --glh

## 2019-06-20 LAB — HM DEXA SCAN

## 2019-06-25 ENCOUNTER — Telehealth: Payer: Self-pay | Admitting: Family Medicine

## 2019-06-25 NOTE — Telephone Encounter (Signed)
Patient returned medical asst's message to call office.  --glh

## 2019-07-02 ENCOUNTER — Encounter: Payer: Self-pay | Admitting: Obstetrics and Gynecology

## 2019-07-02 ENCOUNTER — Other Ambulatory Visit: Payer: Self-pay

## 2019-07-02 ENCOUNTER — Ambulatory Visit: Payer: Medicare PPO | Admitting: Obstetrics and Gynecology

## 2019-07-02 DIAGNOSIS — N8189 Other female genital prolapse: Secondary | ICD-10-CM

## 2019-07-02 NOTE — Patient Instructions (Signed)
Pelvic Organ Prolapse Pelvic organ prolapse is the stretching, bulging, or dropping of pelvic organs into an abnormal position. It happens when the muscles and tissues that surround and support pelvic structures become weak or stretched. Pelvic organ prolapse can involve the:  Vagina (vaginal prolapse).  Uterus (uterine prolapse).  Bladder (cystocele).  Rectum (rectocele).  Intestines (enterocele). When organs other than the vagina are involved, they often bulge into the vagina or protrude from the vagina, depending on how severe the prolapse is. What are the causes? This condition may be caused by:  Pregnancy, labor, and childbirth.  Past pelvic surgery.  Decreased production of the hormone estrogen associated with menopause.  Consistently lifting more than 50 lb (23 kg).  Obesity.  Long-term inability to pass stool (chronic constipation).  A cough that lasts a long time (chronic).  Buildup of fluid in the abdomen due to certain diseases and other conditions. What are the signs or symptoms? Symptoms of this condition include:  Passing a little urine (loss of bladder control) when you cough, sneeze, strain, and exercise (stress incontinence). This may be worse immediately after childbirth. It may gradually improve over time.  Feeling pressure in your pelvis or vagina. This pressure may increase when you cough or when you are passing stool.  A bulge that protrudes from the opening of your vagina.  Difficulty passing urine or stool.  Pain in your lower back.  Pain, discomfort, or disinterest in sex.  Repeated bladder infections (urinary tract infections).  Difficulty inserting a tampon. In some people, this condition causes no symptoms. How is this diagnosed? This condition may be diagnosed based on a vaginal and rectal exam. During the exam, you may be asked to cough and strain while you are lying down, sitting, and standing up. Your health care provider will  determine if other tests are required, such as bladder function tests. How is this treated? Treatment for this condition may depend on your symptoms. Treatment may include:  Lifestyle changes, such as changes to your diet.  Emptying your bladder at scheduled times (bladder training therapy). This can help reduce or avoid urinary incontinence.  Estrogen. Estrogen may help mild prolapse by increasing the strength and tone of pelvic floor muscles.  Kegel exercises. These may help mild cases of prolapse by strengthening and tightening the muscles of the pelvic floor.  A soft, flexible device that helps support the vaginal walls and keep pelvic organs in place (pessary). This is inserted into your vagina by your health care provider.  Surgery. This is often the only form of treatment for severe prolapse. Follow these instructions at home:  Avoid drinking beverages that contain caffeine or alcohol.  Increase your intake of high-fiber foods. This can help decrease constipation and straining during bowel movements.  Lose weight if recommended by your health care provider.  Wear a sanitary pad or adult diapers if you have urinary incontinence.  Avoid heavy lifting and straining with exercise and work. Do not hold your breath when you perform mild to moderate lifting and exercise activities. Limit your activities as directed by your health care provider.  Do Kegel exercises as directed by your health care provider. To do this: ? Squeeze your pelvic floor muscles tight. You should feel a tight lift in your rectal area and a tightness in your vaginal area. Keep your stomach, buttocks, and legs relaxed. ? Hold the muscles tight for up to 10 seconds. ? Relax your muscles. ? Repeat this exercise 50 times a day,   or as many times as told by your health care provider. Continue to do this exercise for at least 4-6 weeks, or for as long as told by your health care provider.  Take over-the-counter and  prescription medicines only as told by your health care provider.  If you have a pessary, take care of it as told by your health care provider.  Keep all follow-up visits as told by your health care provider. This is important. Contact a health care provider if you:  Have symptoms that interfere with your daily activities or sex life.  Need medicine to help with the discomfort.  Notice bleeding from your vagina that is not related to your period.  Have a fever.  Have pain or bleeding when you urinate.  Have bleeding when you pass stool.  Pass urine when you have sex.  Have chronic constipation.  Have a pessary that falls out.  Have bad smelling vaginal discharge.  Have an unusual, low pain in your abdomen. Summary  Pelvic organ prolapse is the stretching, bulging, or dropping of pelvic organs into an abnormal position. It happens when the muscles and tissues that surround and support pelvic structures become weak or stretched.  When organs other than the vagina are involved, they often bulge into the vagina or protrude from the vagina, depending on how severe the prolapse is.  In most cases, this condition needs to be treated only if it produces symptoms. Treatment may include lifestyle changes, estrogen, Kegel exercises, pessary insertion, or surgery.  Avoid heavy lifting and straining with exercise and work. Do not hold your breath when you perform mild to moderate lifting and exercise activities. Limit your activities as directed by your health care provider. This information is not intended to replace advice given to you by your health care provider. Make sure you discuss any questions you have with your health care provider. Document Revised: 04/26/2017 Document Reviewed: 04/26/2017 Elsevier Patient Education  2020 [employer]  

## 2019-07-02 NOTE — Progress Notes (Signed)
Ms Abston presents in referral form PCP for possible vaginal prolapse. Pt has noted increase pelvic pressure and a "bulge" over the last month. She denies any frank Sx of incontinence. She does wear a pad now for the last month. She has had a few accidents over the last month as well.  She has had problems with constipation in the past but this is better she says. She is sexual active occassionally. She denies excessive caffeine use. Voids 2-3 times during the day and 1-2 times at night. She had a TAH/BSO over 20 yrs ago. 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  PE AF VSS Lungs clear Heart RRR Abd soft + BS  GU nl EGBUS, enterocele/cystocele to hymenal ring in supine position, past hymenal ring with Valsalva, vaginal mucosa is atrophic, cervix and uterus absent, bladder non tender, no adnexal masses, uterosacral ligaments attenuated, perineal body @ 1 cm, rectal confirms above  A/P Pelvic relaxation  Dx discussed with pt. Management options reviewed. Pt undecided at this time but request referral to [facility] for surgerical evaluation. Referral placed. F/U PRN More than 50 % of time spent face to face discussing management.

## 2019-07-22 ENCOUNTER — Encounter: Payer: Self-pay | Admitting: Family Medicine

## 2019-07-22 ENCOUNTER — Ambulatory Visit: Payer: Medicare PPO | Admitting: Family Medicine

## 2019-07-22 ENCOUNTER — Other Ambulatory Visit: Payer: Self-pay

## 2019-07-22 VITALS — BP 108/62 | HR 89 | Temp 98.3°F | Ht 63.25 in | Wt 125.5 lb

## 2019-07-22 DIAGNOSIS — M810 Age-related osteoporosis without current pathological fracture: Secondary | ICD-10-CM | POA: Diagnosis not present

## 2019-07-22 MED ORDER — ALENDRONATE SODIUM 70 MG PO TABS: 70.0000 mg | ORAL_TABLET | ORAL | 0 refills | Status: DC

## 2019-07-22 NOTE — Progress Notes (Signed)
Impression and Recommendations:    1. Osteoporosis without current pathological fracture, unspecified osteoporosis type   2. Postmenopausal bone loss      - Strongly advised patient to utilize a pillbox for for her medications.  Osteoporosis - Patient's last DEXA scan obtained 06/20/2019. - Reviewed patient's recent DEXA in comparison to prior scan in 2017. - Advised patient that she has osteoporosis and qualifies for prescription treatment.  - Discussed risks, benefits, and alternatives to prescription treatment. - Extensive education provided and all questions answered.  - Advised patient to begin prescription Fosamax today.  See med list. - Continue Calcium and Vitamin D supplementation as well.  - Discussed that some people cannot tolerate medication due to various S-E, and that if the patient experiences intolerable side-effects, we may transition patient's prescription plan to an alternative as needed.  - Encouraged patient to continue to engage in regular weight-bearing exercise.  - Will continue to monitor and re-check DEXA as advised.     No orders of the defined types were placed in this encounter.   Meds ordered this encounter  Medications  . alendronate (FOSAMAX) 70 MG tablet    Sig: Take 1 tablet (70 mg total) by mouth every 7 (seven) days.    Dispense:  12 tablet    Refill:  0    Medications Discontinued During This Encounter  Medication Reason  . aspirin-acetaminophen-caffeine (EXCEDRIN MIGRAINE) 250-250-65 MG tablet   . atorvastatin (LIPITOR) 40 MG tablet       Please see AVS handed out to patient at the end of our visit for further patient instructions/ counseling done pertaining to today's office visit.   Return for Medicare Wellness once yearly as scheduled, otherwise f/up every 4 months.     Note:  This note was prepared with assistance of Dragon voice recognition software. Occasional wrong-word or sound-a-like substitutions may have  occurred due to the inherent limitations of voice recognition software.   The [place] was signed into law in 2016 which includes the topic of electronic health records.  This provides immediate access to information in MyChart.  This includes consultation notes, operative notes, office notes, lab results and pathology reports.  If you have any questions about what you read please let us know at your next visit or call us at the office.  We are right here with you.    This case required medical decision making of at least moderate complexity.  This document serves as a record of services personally performed by Mellody Dance, DO. It was created on her behalf by Toni Amend, a trained medical scribe. The creation of this record is based on the scribe's personal observations and the provider's statements to them.    The above documentation from Toni Amend, medical scribe, has been reviewed by Marjory Sneddon, D.O.  --------------------------------------------------------------------------------------------------------------------------------------------------------------------------------------------------------------------------------------------    Subjective:     Mikayla Washington, am serving as scribe for Dr.Natoria Archibald.   HPI: Mikayla Washington is a 74 y.o. female who presents to [facility] today for issues as discussed below.  - Osteoporosis States that she continues taking calcium with Vitamin D, "religiously."  She has never been treated for osteoporosis prior.  Patient has not had cancer in the past.  She had a thyroid tumor removed in the past, but it was not cancerous.  She has had "pre-cancerous places in my intestines," confirming that these were polyps.  She has had  pre-cancerous places taken off of her arms and legs, but never officially cancer.  She had issues with constipation in the past, but "the past few  years, it's been the opposite."  She walks about an hour per day, and notes "I work Scientist, research (medical), and honestly run most of that."  She works at [employer]  - Bladder/Vaginal Prolapse; seen by specialists Notes she has had a possible bladder/vaginal prolapse, but Dr. Rip Harbour of OBGYN could not provide the surgery.  She wanted to stay in the [facility] system, but was referred to Dr. Zigmund Daniel of Urology.    Wt Readings from Last 3 Encounters:  07/22/19 125 lb 8 oz (56.9 kg)  07/02/19 128 lb (58.1 kg)  04/04/19 130 lb (59 kg)   BP Readings from Last 3 Encounters:  07/22/19 108/62  07/02/19 106/63  06/13/19 108/68   Pulse Readings from Last 3 Encounters:  07/22/19 89  07/02/19 80  06/13/19 83   BMI Readings from Last 3 Encounters:  07/22/19 22.06 kg/m  07/02/19 22.67 kg/m  04/04/19 23.03 kg/m     Patient Care Team    Relationship Specialty Notifications Start End  Mellody Dance, DO PCP - General Family Medicine  02/22/16   Calvert Cantor, MD Consulting Physician Ophthalmology  02/22/16   Sable Feil, MD Consulting Physician Gastroenterology  02/22/16    Comment: Rudolpho Sevin     Patient Active Problem List   Diagnosis Date Noted  . Prediabetes 09/27/2018  . Elevated HDL 02/22/2016  . HLD (hyperlipidemia) 11/26/2013  . Adopted person 02/22/2016  . Vitamin D deficiency 11/02/2012  . Osteoporosis without current pathological fracture 07/22/2019  . Pelvic floor relaxation 07/02/2019  . Estrogen deficiency 04/04/2019  . Postmenopausal bone loss 04/04/2019  . Counseled about COVID-19 virus infection 03/29/2019  . Tingling sensation chest into L arm 03/29/2019  . Atypical nevi * 2 10/03/2018  . Irregular pigmentation of skin 09/27/2018  . Cough in adult 05/14/2018  . Productive cough 11/22/2016  . Status post hysterectomy with oophorectomy in 2000 02/22/2016  . Hemorrhoids 02/22/2016  . Allergic rhinitis 11/28/2014  . Eustachian tube dysfunction 05/31/2014  . Liver cyst  02/18/2014  . Chronic constipation 01/11/2014  . Hx of adenomatous colonic polyps 11/02/2012  . Osteopenia 11/02/2012  . Irritable bowel syndrome (IBS) 11/02/2012  . Tinnitus, subjective 11/02/2012  . Menopausal and postmenopausal disorder 11/03/2008  . Osteoarthritis 11/03/2008    Past Medical history, Surgical history, Family history, Social history, Allergies and Medications have been entered into the medical record, reviewed and changed as needed.    Current Meds  Medication Sig  . Multiple Vitamins-Minerals (CENTRUM SILVER PO) Take 1 tablet by mouth daily.  . Vitamin D, Ergocalciferol, (DRISDOL) 1.25 MG (50000 UT) CAPS capsule Take one tablet wkly (Patient taking differently: Take 50,000 Units by mouth every 7 (seven) days. Take one tablet wkly)    Allergies:  No Known Allergies   Review of Systems:  A fourteen system review of systems was performed and found to be positive as per HPI.   Objective:   Blood pressure 108/62, pulse 89, temperature 98.3 F (36.8 C), temperature source Oral, height 5' 3.25" (1.607 m), weight 125 lb 8 oz (56.9 kg), SpO2 97 %. Body mass index is 22.06 kg/m. General:  Well Developed, well nourished, appropriate for stated age.  Neuro:  Alert and oriented,  extra-ocular muscles intact  HEENT:  Normocephalic, atraumatic, neck supple, no carotid bruits appreciated  Skin:  no gross rash, warm, pink. Cardiac:  RRR, S1 S2 Respiratory:  ECTA B/L and A/P, Not using accessory muscles, speaking in full sentences- unlabored. Vascular:  Ext warm, no cyanosis apprec.; cap RF less 2 sec. Psych:  No HI/SI, judgement and insight good, Euthymic mood. Full Affect.

## 2019-07-22 NOTE — Patient Instructions (Addendum)
If tolerating new med well. Please make sure you call for refills and continue it until we state otherwise  You do not need any labs today.  You are up-to-date and everything looks great.  Please continue to watch sugars, for your pre-Diabetes condition and cont your 1 hour or more a day walking   Bone Health Bones protect organs, store calcium, anchor muscles, and support the whole body. Keeping your bones strong is important, especially as you get older. You can take actions to help keep your bones strong and healthy. Why is keeping my bones healthy important?  Keeping your bones healthy is important because your body constantly replaces bone cells. Cells get old, and new cells take their place. As we age, we lose bone cells because the body may not be able to make enough new cells to replace the old cells. The amount of bone cells and bone tissue you have is referred to as bone mass. The higher your bone mass, the stronger your bones. The aging process leads to an overall loss of bone mass in the body, which can increase the likelihood of:  Joint pain and stiffness.  Broken bones.  A condition in which the bones become weak and brittle (osteoporosis). A large decline in bone mass occurs in older adults. In women, it occurs about the time of menopause. What actions can I take to keep my bones healthy? Good health habits are important for maintaining healthy bones. This includes eating nutritious foods and exercising regularly. To have healthy bones, you need to get enough of the right minerals and vitamins. Most nutrition experts recommend getting these nutrients from the foods that you eat. In some cases, taking supplements may also be recommended. Doing certain types of exercise is also important for bone health. What are the nutritional recommendations for healthy bones?  Eating a well-balanced diet with plenty of calcium and vitamin D will help to protect your bones. Nutritional  recommendations vary from person to person. Ask your health care provider what is healthy for you. Here are some general guidelines. Get enough calcium Calcium is the most important (essential) mineral for bone health. Most people can get enough calcium from their diet, but supplements may be recommended for people who are at risk for osteoporosis. Good sources of calcium include:  Dairy products, such as low-fat or nonfat milk, cheese, and yogurt.  Dark green leafy vegetables, such as bok choy and broccoli.  Calcium-fortified foods, such as orange juice, cereal, bread, soy beverages, and tofu products.  Nuts, such as almonds. Follow these recommended amounts for daily calcium intake:  Children, age 30-3: 700 mg.  Children, age 73-8: 1,000 mg.  Children, age 76-13: 1,300 mg.  Teens, age 62-18: 1,300 mg.  Adults, age 738-50: 1,000 mg.  Adults, age 3-70: ? Men: 1,000 mg. ? Women: 1,200 mg.  Adults, age 58 or older: 1,200 mg.  Pregnant and breastfeeding females: ? Teens: 1,300 mg. ? Adults: 1,000 mg. Get enough vitamin D Vitamin D is the most essential vitamin for bone health. It helps the body absorb calcium. Sunlight stimulates the skin to make vitamin D, so be sure to get enough sunlight. If you live in a cold climate or you do not get outside often, your health care provider may recommend that you take vitamin D supplements. Good sources of vitamin D in your diet include:  Egg yolks.  Saltwater fish.  Milk and cereal fortified with vitamin D. Follow these recommended amounts for daily vitamin  D intake:  Children and teens, age 69-18: 600 international units.  Adults, age 27 or younger: 400-800 international units.  Adults, age 21 or older: 800-1,000 international units. Get other important nutrients Other nutrients that are important for bone health include:  Phosphorus. This mineral is found in meat, poultry, dairy foods, nuts, and legumes. The recommended daily intake  for adult men and adult women is 700 mg.  Magnesium. This mineral is found in seeds, nuts, dark green vegetables, and legumes. The recommended daily intake for adult men is 400-420 mg. For adult women, it is 310-320 mg.  Vitamin K. This vitamin is found in green leafy vegetables. The recommended daily intake is 120 mg for adult men and 90 mg for adult women. What type of physical activity is best for building and maintaining healthy bones? Weight-bearing and strength-building activities are important for building and maintaining healthy bones. Weight-bearing activities cause muscles and bones to work against gravity. Strength-building activities increase the strength of the muscles that support bones. Weight-bearing and muscle-building activities include:  Walking and hiking.  Jogging and running.  Dancing.  Gym exercises.  Lifting weights.  Tennis and racquetball.  Climbing stairs.  Aerobics. Adults should get at least 30 minutes of moderate physical activity on most days. Children should get at least 60 minutes of moderate physical activity on most days. Ask your health care provider what type of exercise is best for you. How can I find out if my bone mass is low? Bone mass can be measured with an X-ray test called a bone mineral density (BMD) test. This test is recommended for all women who are age 64 or older. It may also be recommended for:  Men who are age 42 or older.  People who are at risk for osteoporosis because of: ? Having bones that break easily. ? Having a long-term disease that weakens bones, such as kidney disease or rheumatoid arthritis. ? Having menopause earlier than normal. ? Taking medicine that weakens bones, such as steroids, thyroid hormones, or hormone treatment for breast cancer or prostate cancer. ? Smoking. ? Drinking three or more alcoholic drinks a day. If you find that you have a low bone mass, you may be able to prevent osteoporosis or further bone  loss by changing your diet and lifestyle. Where can I find more information? For more information, check out the following websites:  [place]: AviationTales.fr  [employer] of Health: www.bones.SouthExposed.es  International Osteoporosis Foundation: Administrator.iofbonehealth.org Summary  The aging process leads to an overall loss of bone mass in the body, which can increase the likelihood of broken bones and osteoporosis.  Eating a well-balanced diet with plenty of calcium and vitamin D will help to protect your bones.  Weight-bearing and strength-building activities are also important for building and maintaining strong bones.  Bone mass can be measured with an X-ray test called a bone mineral density (BMD) test. This information is not intended to replace advice given to you by your health care provider. Make sure you discuss any questions you have with your health care provider. Document Revised: 05/01/2017 Document Reviewed: 05/01/2017 Elsevier Patient Education  2020 [employer] for Osteoporosis Osteoporosis causes your bones to become weak and brittle. This puts you at greater risk for bone breaks (fractures) from small bumps or falls. Making changes to your diet and increasing your physical activity can help strengthen your bones and improve your overall health. Calcium and vitamin D are nutrients that  play an important role in bone health. Vitamin D helps your body use calcium and strengthen bones. Therefore, it is important to get enough calcium and vitamin D as part of your eating plan for osteoporosis. What are tips for following this plan? Reading food labels  Try to get at least 1,000 milligrams (mg) of calcium each day.  Look for foods that have at least 50 mg of calcium per serving.  Talk with your health care provider about taking a calcium supplement if you do not get enough calcium from food.  Do not have more than 2,500 mg  of calcium each day. This is the upper limit for food and nutritional supplements combined. Too much calcium may cause constipation and prevent you from absorbing other important nutrients.  Choose foods that contain vitamin D.  Take a daily vitamin supplement that contains 800-1,000 international units (IU) of vitamin D. The amount may be different depending on your age, body weight, ethnicity, and where you live. Talk with your dietitian or health care provider about how much vitamin D is right for you.  Avoid foods that have more than 300 mg of sodium per serving. Too much sodium can cause your body to lose calcium.  Talk with your dietitian or health care provider about how much sodium you are allowed each day. Shopping  Do not buy foods with added salt, including: ? Salted snacks. ? Angie Fava. ? Canned soups. ? Canned meats. ? Processed meats, such as bacon or cold cuts. ? Smoked fish. Meal planning  Eat balanced meals that contain protein foods, fruits and vegetables, and foods rich in calcium and vitamin D.  Eat at least 5 servings of fruits and vegetables each day.  Eat 5-6 oz. of lean meat, poultry, fish, eggs, or beans each day. Lifestyle  Do not use any products that contain nicotine or tobacco, such as cigarettes and e-cigarettes. If you need help quitting, ask your health care provider.  If your health care provider recommends that you lose weight: ? Work with a dietitian to develop an eating plan that will help you reach your desired weight goal. ? Exercise for at least 30 minutes a day, 5 or more days a week, or as told by your health care provider.  Work with a physical therapist to develop an exercise plan that includes flexibility, balance, and strength exercises.  If you drink alcohol, limit how much you have. This means: ? 0-1 drink a day for women. ? 0-2 drinks a day for men. ? Be aware of how much alcohol is in your drink. In the [place] one drink equals one  typical bottle of beer (12 oz), one-half glass of wine (5 oz), or one shot of hard liquor (1 oz). What foods should I eat? Foods high in calcium   Yogurt. Yogurt with fruit.  Milk. Evaporated skim milk. Dry milk powder.  Calcium-fortified orange juice.  Parmesan cheese. Part-skim ricotta cheese. Natural hard cheese. Cream cheese. Cottage cheese.  Canned sardines. Canned salmon.  Calcium-treated tofu. Calcium-fortified cereal bar. Calcium-fortified cereal. Calcium-fortified graham crackers.  Cooked collard greens. Turnip greens. Broccoli. Kale.  Almonds.  White beans.  Corn tortilla. Foods high in vitamin D  Cod liver oil. Fatty fish, such as tuna, mackerel, and salmon.  Milk. Fortified soy milk. Fortified fruit juice.  Yogurt. Margarine.  Egg yolks. Foods high in protein  Beef. Lamb. Pork tenderloin.  Chicken breast.  Tuna (canned). Fish fillet.  Tofu.  Soy beans (cooked). Soy patty. Beans (  canned or cooked).  Cottage cheese.  Yogurt.  Peanut butter.  Pumpkin seeds. Nuts. Sunflower seeds.  Hard cheese.  Milk or other milk products, such as soy milk. The items listed above may not be a complete list of foods and beverages you can eat. Contact a dietitian for more options. Summary  Calcium and vitamin D are nutrients that play an important role in bone health and are an important part of your eating plan for osteoporosis.  Eat balanced meals that contain protein foods, fruits and vegetables, and foods rich in calcium and vitamin D.  Avoid foods that have more than 300 mg of sodium per serving. Too much sodium can cause your body to lose calcium.  Exercise is an important part of prevention and treatment of osteoporosis. Aim for at least 30 minutes a day, 5 days a week. This information is not intended to replace advice given to you by your health care provider. Make sure you discuss any questions you have with your health care provider. Document  Revised: 06/12/2017 Document Reviewed: 06/12/2017 Elsevier Patient Education  2020 [employer].

## 2019-09-11 DIAGNOSIS — K469 Unspecified abdominal hernia without obstruction or gangrene: Secondary | ICD-10-CM | POA: Insufficient documentation

## 2019-09-12 DIAGNOSIS — N952 Postmenopausal atrophic vaginitis: Secondary | ICD-10-CM | POA: Insufficient documentation

## 2019-10-14 ENCOUNTER — Other Ambulatory Visit: Payer: Self-pay | Admitting: Family Medicine

## 2019-10-14 DIAGNOSIS — M810 Age-related osteoporosis without current pathological fracture: Secondary | ICD-10-CM

## 2019-10-23 DIAGNOSIS — K5909 Other constipation: Secondary | ICD-10-CM | POA: Diagnosis not present

## 2019-10-23 DIAGNOSIS — N812 Incomplete uterovaginal prolapse: Secondary | ICD-10-CM | POA: Diagnosis not present

## 2019-10-23 DIAGNOSIS — N816 Rectocele: Secondary | ICD-10-CM | POA: Diagnosis not present

## 2019-10-23 DIAGNOSIS — R159 Full incontinence of feces: Secondary | ICD-10-CM | POA: Diagnosis not present

## 2019-10-23 DIAGNOSIS — K469 Unspecified abdominal hernia without obstruction or gangrene: Secondary | ICD-10-CM | POA: Diagnosis not present

## 2019-10-23 DIAGNOSIS — N3946 Mixed incontinence: Secondary | ICD-10-CM | POA: Diagnosis not present

## 2019-10-23 DIAGNOSIS — N952 Postmenopausal atrophic vaginitis: Secondary | ICD-10-CM | POA: Diagnosis not present

## 2019-10-31 DIAGNOSIS — R195 Other fecal abnormalities: Secondary | ICD-10-CM | POA: Diagnosis not present

## 2019-10-31 DIAGNOSIS — K5909 Other constipation: Secondary | ICD-10-CM | POA: Diagnosis not present

## 2019-10-31 DIAGNOSIS — N812 Incomplete uterovaginal prolapse: Secondary | ICD-10-CM | POA: Diagnosis not present

## 2019-10-31 DIAGNOSIS — N815 Vaginal enterocele: Secondary | ICD-10-CM | POA: Diagnosis not present

## 2019-10-31 DIAGNOSIS — N186 End stage renal disease: Secondary | ICD-10-CM | POA: Diagnosis not present

## 2019-10-31 DIAGNOSIS — N816 Rectocele: Secondary | ICD-10-CM | POA: Diagnosis not present

## 2019-10-31 DIAGNOSIS — Z86010 Personal history of colonic polyps: Secondary | ICD-10-CM | POA: Diagnosis not present

## 2019-10-31 DIAGNOSIS — N952 Postmenopausal atrophic vaginitis: Secondary | ICD-10-CM | POA: Diagnosis not present

## 2019-10-31 DIAGNOSIS — R151 Fecal smearing: Secondary | ICD-10-CM | POA: Diagnosis not present

## 2019-11-15 ENCOUNTER — Other Ambulatory Visit: Payer: Self-pay | Admitting: Family Medicine

## 2019-11-15 DIAGNOSIS — E559 Vitamin D deficiency, unspecified: Secondary | ICD-10-CM

## 2019-12-09 ENCOUNTER — Telehealth: Payer: Self-pay | Admitting: Physician Assistant

## 2019-12-09 DIAGNOSIS — E559 Vitamin D deficiency, unspecified: Secondary | ICD-10-CM

## 2019-12-09 MED ORDER — VITAMIN D (ERGOCALCIFEROL) 1.25 MG (50000 UNIT) PO CAPS: ORAL_CAPSULE | ORAL | 3 refills | Status: AC

## 2019-12-09 NOTE — Addendum Note (Signed)
Addended by: Mickel Crow on: 12/09/2019 02:45 PM   Modules accepted: Orders

## 2019-12-09 NOTE — Telephone Encounter (Signed)
Med sent to requested pharmacy. AS, CMA 

## 2019-12-09 NOTE — Telephone Encounter (Signed)
Patient is requesting a refill of her Vit D, order went to previous PCP so she has been waiting on med. If approved please send to [facility] on [address] in [place]

## 2019-12-11 DIAGNOSIS — Z09 Encounter for follow-up examination after completed treatment for conditions other than malignant neoplasm: Secondary | ICD-10-CM | POA: Diagnosis not present

## 2019-12-11 DIAGNOSIS — N816 Rectocele: Secondary | ICD-10-CM | POA: Diagnosis not present

## 2019-12-11 DIAGNOSIS — N952 Postmenopausal atrophic vaginitis: Secondary | ICD-10-CM | POA: Diagnosis not present

## 2019-12-11 DIAGNOSIS — R8290 Unspecified abnormal findings in urine: Secondary | ICD-10-CM | POA: Diagnosis not present

## 2019-12-11 DIAGNOSIS — R82998 Other abnormal findings in urine: Secondary | ICD-10-CM | POA: Diagnosis not present

## 2019-12-11 DIAGNOSIS — K469 Unspecified abdominal hernia without obstruction or gangrene: Secondary | ICD-10-CM | POA: Diagnosis not present

## 2020-02-04 ENCOUNTER — Ambulatory Visit
Admission: EM | Admit: 2020-02-04 | Discharge: 2020-02-04 | Disposition: A | Discharge status: Home / Self Care | Payer: Medicare PPO | Attending: Emergency Medicine | Admitting: Emergency Medicine

## 2020-02-04 ENCOUNTER — Ambulatory Visit (INDEPENDENT_AMBULATORY_CARE_PROVIDER_SITE_OTHER): Payer: Medicare PPO

## 2020-02-04 DIAGNOSIS — M79671 Pain in right foot: Secondary | ICD-10-CM | POA: Diagnosis not present

## 2020-02-04 DIAGNOSIS — W19XXXA Unspecified fall, initial encounter: Secondary | ICD-10-CM | POA: Diagnosis not present

## 2020-02-04 DIAGNOSIS — M7989 Other specified soft tissue disorders: Secondary | ICD-10-CM | POA: Diagnosis not present

## 2020-02-04 DIAGNOSIS — S99921A Unspecified injury of right foot, initial encounter: Secondary | ICD-10-CM

## 2020-02-04 DIAGNOSIS — S92901A Unspecified fracture of right foot, initial encounter for closed fracture: Secondary | ICD-10-CM

## 2020-02-04 NOTE — ED Triage Notes (Signed)
[place] the [employer] help with food prep and a can of food fell from top shelf on to her rt foot. Redness and swelling noted. States applied ice right after.

## 2020-02-04 NOTE — ED Provider Notes (Signed)
[facility]    CSN: [id number] Arrival date & time: 02/04/20  1228      History   Chief Complaint Chief Complaint  Patient presents with  . Foot Injury    HPI Mikayla Washington is a 74 y.o. female  Presenting for right foot pain s/p injury.  Patient provides history: States around 1130 this morning a can of food fell from the top shelf and hit her foot.  Denies bleeding, open wound, numbness or deformity.  Has applied ice with some relief.  Past Medical History:  Diagnosis Date  . Adenomatous colon polyp 2013   Dr Sharlett Iles  . Breast disease    fibrocystic  . DJD (degenerative joint disease)     Patient Active Problem List   Diagnosis Date Noted  . Osteoporosis without current pathological fracture 07/22/2019  . Pelvic floor relaxation 07/02/2019  . Estrogen deficiency 04/04/2019  . Postmenopausal bone loss 04/04/2019  . Counseled about COVID-19 virus infection 03/29/2019  . Tingling sensation chest into L arm 03/29/2019  . Atypical nevi * 2 10/03/2018  . Prediabetes 09/27/2018  . Irregular pigmentation of skin 09/27/2018  . Cough in adult 05/14/2018  . Productive cough 11/22/2016  . Status post hysterectomy with oophorectomy in 2000 02/22/2016  . Hemorrhoids 02/22/2016  . Elevated HDL 02/22/2016  . Adopted person 02/22/2016  . Allergic rhinitis 11/28/2014  . Eustachian tube dysfunction 05/31/2014  . Liver cyst 02/18/2014  . Chronic constipation 01/11/2014  . HLD (hyperlipidemia) 11/26/2013  . Hx of adenomatous colonic polyps 11/02/2012  . Vitamin D deficiency 11/02/2012  . Osteopenia 11/02/2012  . Irritable bowel syndrome (IBS) 11/02/2012  . Tinnitus, subjective 11/02/2012  . Menopausal and postmenopausal disorder 11/03/2008  . Osteoarthritis 11/03/2008    Past Surgical History:  Procedure Laterality Date  . ABDOMINAL HYSTERECTOMY    . BREAST LUMPECTOMY     X 3  . COLONOSCOPY  2002    Dr Sharlett Iles, negative  . colonoscopy with polypectomy   2013   adenomatous polyp  . TONSILLECTOMY    . TOTAL ABDOMINAL HYSTERECTOMY W/ BILATERAL SALPINGOOPHORECTOMY  2001    benign ovarian tumor (BSO also)    OB History    Gravida  3   Para  2   Term  2   Preterm      AB  1   Living  2     SAB  1   TAB      Ectopic      Multiple      Live Births  2            Home Medications    Prior to Admission medications   Medication Sig Start Date End Date Taking? Authorizing Provider  Multiple Vitamins-Minerals (CENTRUM SILVER PO) Take 1 tablet by mouth daily.    [provider]  Vitamin D, Ergocalciferol, (DRISDOL) 1.25 MG (50000 UNIT) CAPS capsule Take one tablet wkly 12/09/19   Lorrene Reid, PA-C    Family History Family History  Adopted: Yes    Social History Social History   Tobacco Use  . Smoking status: Never Smoker  . Smokeless tobacco: Never Used  Vaping Use  . Vaping Use: Never used  Substance Use Topics  . Alcohol use: No    Comment: rare, 1 x per year or at celebrations   . Drug use: No     Allergies   Patient has no known allergies.   Review of Systems As per HPI   Physical  Exam Triage Vital Signs ED Triage Vitals  Enc Vitals Group     BP 02/04/20 1336 128/74     Pulse Rate 02/04/20 1336 85     Resp 02/04/20 1336 18     Temp 02/04/20 1336 97.7 F (36.5 C)     Temp Source 02/04/20 1336 Oral     SpO2 02/04/20 1336 96 %     Weight --      Height --      Head Circumference --      Peak Flow --      Pain Score 02/04/20 1337 8     Pain Loc --      Pain Edu? --      Excl. in [place]? --    No data found.  Updated Vital Signs BP 128/74 (BP Location: Left Arm)   Pulse 85   Temp 97.7 F (36.5 C) (Oral)   Resp 18   SpO2 96%   Visual Acuity Right Eye Distance:   Left Eye Distance:   Bilateral Distance:    Right Eye Near:   Left Eye Near:    Bilateral Near:     Physical Exam Constitutional:      General: She is not in acute distress. HENT:     Head:  Normocephalic and atraumatic.  Eyes:     General: No scleral icterus.    Pupils: Pupils are equal, round, and reactive to light.  Cardiovascular:     Rate and Rhythm: Normal rate.  Pulmonary:     Effort: Pulmonary effort is normal.  Musculoskeletal:        General: Tenderness present. No swelling. Normal range of motion.     Comments: Dorsal aspect of foot, slightly medial.  No bony deformity/crepitus  Skin:    Coloration: Skin is not jaundiced or pale.     Findings: Bruising present.  Neurological:     Mental Status: She is alert and oriented to person, place, and time.      UC Treatments / Results  Labs (all labs ordered are listed, but only abnormal results are displayed) Labs Reviewed - No data to display  EKG   Radiology DG Foot Complete Right  Result Date: 02/04/2020 CLINICAL DATA:  Fall. EXAM: RIGHT FOOT COMPLETE - 3+ VIEW COMPARISON:  None. FINDINGS: Diffuse osteopenia and degenerative change. Subtle fracture fragment along the medial aspect of the navicula versus accessory ossicle noted. Calcaneal spurring. No other focal bony abnormality identified. Tiny metallic densities noted the soft tissues along the plantar aspect of the posterior portion of foot. IMPRESSION: 1. Subtle fracture fragment along the medial aspect of the navicula versus accessory ossicle. No other focal bony abnormality identified. 2. Diffuse osteopenia and degenerative change. 3. Tiny metallic densities noted in the soft tissues of the posterior portion of the foot. Electronically Signed   By: Marcello Moores  Register   On: 02/04/2020 14:49    Procedures Procedures (including critical care time)  Medications Ordered in UC Medications - No data to display  Initial Impression / Assessment and Plan / UC Course  I have reviewed the triage vital signs and the nursing notes.  Pertinent labs & imaging results that were available during my care of the patient were reviewed by me and considered in my medical  decision making (see chart for details).     X-ray with diffuse osteopenia degenerative changes.  Patient does have subtle fracture fragment along the medial aspect of the navicula, though could also be accessory ossicle.  Discussed findings with patient.  Given age, fall risk, will give cam walker boot in lieu of nonweightbearing with crutches.  Will follow up with Ortho within the next 48 hours for further eval/mgmt.  Return precautions discussed, pt verbalized understanding and is agreeable to plan. Final Clinical Impressions(s) / UC Diagnoses   Final diagnoses:  Injury of right foot, initial encounter  Closed fracture of right foot, initial encounter   Discharge Instructions   None    ED Prescriptions    None     PDMP not reviewed this encounter.   [place], [place], [place] 02/04/20 1530

## 2020-02-06 DIAGNOSIS — M79671 Pain in right foot: Secondary | ICD-10-CM | POA: Diagnosis not present

## 2020-02-13 DIAGNOSIS — Z1231 Encounter for screening mammogram for malignant neoplasm of breast: Secondary | ICD-10-CM | POA: Diagnosis not present

## 2020-02-13 LAB — HM MAMMOGRAPHY

## 2020-02-24 ENCOUNTER — Encounter: Payer: Self-pay | Admitting: Physician Assistant

## 2020-02-27 DIAGNOSIS — M79671 Pain in right foot: Secondary | ICD-10-CM | POA: Diagnosis not present

## 2020-02-27 DIAGNOSIS — S92324D Nondisplaced fracture of second metatarsal bone, right foot, subsequent encounter for fracture with routine healing: Secondary | ICD-10-CM | POA: Diagnosis not present

## 2020-02-27 DIAGNOSIS — S92254D Nondisplaced fracture of navicular [scaphoid] of right foot, subsequent encounter for fracture with routine healing: Secondary | ICD-10-CM | POA: Diagnosis not present

## 2020-03-26 DIAGNOSIS — S92254D Nondisplaced fracture of navicular [scaphoid] of right foot, subsequent encounter for fracture with routine healing: Secondary | ICD-10-CM | POA: Diagnosis not present

## 2020-03-26 DIAGNOSIS — S92324D Nondisplaced fracture of second metatarsal bone, right foot, subsequent encounter for fracture with routine healing: Secondary | ICD-10-CM | POA: Diagnosis not present

## 2020-04-30 DIAGNOSIS — S92254D Nondisplaced fracture of navicular [scaphoid] of right foot, subsequent encounter for fracture with routine healing: Secondary | ICD-10-CM | POA: Diagnosis not present

## 2020-04-30 DIAGNOSIS — S92324D Nondisplaced fracture of second metatarsal bone, right foot, subsequent encounter for fracture with routine healing: Secondary | ICD-10-CM | POA: Diagnosis not present

## 2020-05-06 ENCOUNTER — Telehealth: Payer: Medicare PPO | Admitting: Family Medicine

## 2020-05-06 ENCOUNTER — Encounter: Payer: Self-pay | Admitting: Family Medicine

## 2020-05-06 ENCOUNTER — Other Ambulatory Visit: Payer: Self-pay

## 2020-05-06 ENCOUNTER — Other Ambulatory Visit: Payer: Medicare PPO

## 2020-05-06 ENCOUNTER — Other Ambulatory Visit (INDEPENDENT_AMBULATORY_CARE_PROVIDER_SITE_OTHER): Payer: Medicare PPO

## 2020-05-06 VITALS — Temp 97.7°F | Wt 130.0 lb

## 2020-05-06 DIAGNOSIS — Z20822 Contact with and (suspected) exposure to covid-19: Secondary | ICD-10-CM

## 2020-05-06 LAB — POC COVID19 BINAXNOW: SARS Coronavirus 2 Ag: NEGATIVE

## 2020-05-06 NOTE — Progress Notes (Signed)
   Subjective:    Patient ID: Mikayla Washington, female    DOB: 1945-07-08, 75 y.o.   MRN: [id number]  HPI I connected with  LEWIS GRIVAS on 05/06/20 by a video enabled telemedicine application and verified that I am speaking with the correct person using two identifiers.  Caregility used.Me: Office.  Patient at home. I discussed the limitations of evaluation and management by telemedicine. The patient expressed understanding and agreed to proceed. Her husband was diagnosed with COVID yesterday.  She does have slight rhinorrhea but states that that has been going on for several months and is nothing new.  No fever, chills, sore throat, cough or congestion.  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  Explained that if she is wearing her mask she should be fine for the commercial work but I would avoid tutoring since it is much closer encounter until we have all the results back.  She was comfortable with that. 20 minutes spent reviewing her medical record today, history consultation and documentation.

## 2020-05-08 LAB — NOVEL CORONAVIRUS, NAA: SARS-CoV-2, NAA: NOT DETECTED

## 2020-05-08 LAB — SARS-COV-2, NAA 2 DAY TAT

## 2020-07-02 ENCOUNTER — Other Ambulatory Visit: Payer: Self-pay

## 2020-07-02 ENCOUNTER — Encounter: Payer: Self-pay | Admitting: Nurse Practitioner

## 2020-07-02 ENCOUNTER — Ambulatory Visit (INDEPENDENT_AMBULATORY_CARE_PROVIDER_SITE_OTHER): Payer: Medicare PPO | Admitting: Nurse Practitioner

## 2020-07-02 VITALS — BP 100/67 | HR 96 | Temp 97.9°F | Ht 63.0 in | Wt 130.6 lb

## 2020-07-02 DIAGNOSIS — Z0000 Encounter for general adult medical examination without abnormal findings: Secondary | ICD-10-CM | POA: Diagnosis not present

## 2020-07-02 DIAGNOSIS — R300 Dysuria: Secondary | ICD-10-CM | POA: Insufficient documentation

## 2020-07-02 DIAGNOSIS — R194 Change in bowel habit: Secondary | ICD-10-CM | POA: Insufficient documentation

## 2020-07-02 DIAGNOSIS — N8110 Cystocele, unspecified: Secondary | ICD-10-CM | POA: Diagnosis not present

## 2020-07-02 DIAGNOSIS — M25511 Pain in right shoulder: Secondary | ICD-10-CM | POA: Insufficient documentation

## 2020-07-02 DIAGNOSIS — E559 Vitamin D deficiency, unspecified: Secondary | ICD-10-CM | POA: Diagnosis not present

## 2020-07-02 DIAGNOSIS — Z1211 Encounter for screening for malignant neoplasm of colon: Secondary | ICD-10-CM | POA: Insufficient documentation

## 2020-07-02 LAB — POCT URINALYSIS DIPSTICK
Bilirubin, UA: NEGATIVE
Blood, UA: NEGATIVE
Glucose, UA: NEGATIVE
Ketones, UA: NEGATIVE
Nitrite, UA: NEGATIVE
Protein, UA: NEGATIVE
Spec Grav, UA: >=1.03 — AB (ref 1.010–1.025)
Urobilinogen, UA: 0.2 E.U./dL
pH, UA: 5.5 (ref 5.0–8.0)

## 2020-07-02 NOTE — Patient Instructions (Signed)
Shoulder Pain Many things can cause shoulder pain, including:  An injury.  Moving the shoulder in the same way again and again (overuse).  Joint pain (arthritis). Pain can come from:  Swelling and irritation (inflammation) of any part of the shoulder.  An injury to the shoulder joint.  An injury to: ? Tissues that connect muscle to bone (tendons). ? Tissues that connect bones to each other (ligaments). ? Bones. Follow these instructions at home: Watch for changes in your symptoms. Let your doctor know about them. Follow these instructions to help with your pain. If you have a sling:  Wear the sling as told by your doctor. Remove it only as told by your doctor.  Loosen the sling if your fingers: ? Tingle. ? Become numb. ? Turn cold and blue.  Keep the sling clean.  If the sling is not waterproof: ? Do not let it get wet. ? Take the sling off when you shower or bathe. Managing pain, stiffness, and swelling  If told, put ice on the painful area: ? Put ice in a plastic bag. ? Place a towel between your skin and the bag. ? Leave the ice on for 20 minutes, 2-3 times a day. Stop putting ice on if it does not help with the pain.  Squeeze a soft ball or a foam pad as much as possible. This prevents swelling in the shoulder. It also helps to strengthen the arm.   General instructions  Take over-the-counter and prescription medicines only as told by your doctor.  Keep all follow-up visits as told by your doctor. This is important. Contact a doctor if:  Your pain gets worse.  Medicine does not help your pain.  You have new pain in your arm, hand, or fingers. Get help right away if:  Your arm, hand, or fingers: ? Tingle. ? Are numb. ? Are swollen. ? Are painful. ? Turn white or blue. Summary  Shoulder pain can be caused by many things. These include injury, moving the shoulder in the same away again and again, and joint pain.  Watch for changes in your symptoms.  Let your doctor know about them.  This condition may be treated with a sling, ice, and pain medicine.  Contact your doctor if the pain gets worse or you have new pain. Get help right away if your arm, hand, or fingers tingle or get numb, swollen, or painful.  Keep all follow-up visits as told by your doctor. This is important. This information is not intended to replace advice given to you by your health care provider. Make sure you discuss any questions you have with your health care provider. Document Revised: 10/17/2017 Document Reviewed: 10/17/2017 Elsevier Patient Education  2021 [employer].  Pelvic Organ Prolapse Pelvic organ prolapse is a condition in women that involves the stretching, bulging, or dropping of pelvic organs into an abnormal position, past the opening of the vagina. It happens when the muscles and tissues that surround and support pelvic structures become weak or stretched. Pelvic organ prolapse can involve the:  Vagina (vaginal prolapse).  Uterus (uterine prolapse).  Bladder (cystocele).  Rectum (rectocele).  Intestines (enterocele). When organs other than the vagina are involved, they often bulge into the vagina or protrude from the vagina, depending on how severe the prolapse is. What are the causes? This condition may be caused by:  Pregnancy, labor, and childbirth.  Past pelvic surgery.  Lower levels of the hormone estrogen due to menopause.  Consistently lifting more  than 50 lb (23 kg).  Obesity.  Long-term difficulty passing stool (chronic constipation).  Long-term, or chronic, cough.  Fluid buildup in the abdomen due to certain conditions. What are the signs or symptoms? Symptoms of this condition include:  Leaking a little urine (loss of bladder control) when you cough, sneeze, strain, and exercise (stress incontinence). This may be worse immediately after childbirth. It may gradually improve over time.  Feeling pressure in your pelvis  or vagina. This pressure may increase when you cough or when you are passing stool.  A bulge that protrudes from the opening of your vagina.  Difficulty passing urine or stool.  Pain in your lower back.  Pain or discomfort during sex, or decreased interest in sex.  Repeated bladder infections (urinary tract infections).  Difficulty inserting a tampon. In some people, this condition causes no symptoms. How is this diagnosed? This condition may be diagnosed based on a vaginal and rectal exam. During the exam, you may be asked to cough and strain while you are lying down, sitting, and standing up. Your health care provider will determine if other tests are required, such as bladder function tests. How is this treated? Treatment for this condition may depend on your symptoms. Treatment may include:  Lifestyle changes, such as drinking plenty of fluids and eating foods that are high in fiber.  Emptying your bladder at scheduled times (bladder training therapy). This can help reduce or avoid urinary incontinence.  Estrogen. This may help mild prolapse by increasing the strength and tone of pelvic floor muscles.  Kegel exercises. These may help mild cases of prolapse by strengthening and tightening the muscles of the pelvic floor.  A soft, flexible device that helps support the vaginal walls and keep pelvic organs in place (pessary). This is inserted into your vagina by your health care provider.  Surgery. This is often the only form of treatment for severe prolapse. Follow these instructions at home: Eating and drinking  Avoid drinking beverages that contain caffeine or alcohol.  Increase your intake of high-fiber foods to decrease constipation and straining during bowel movements. Activity  Lose weight if recommended by your health care provider.  Avoid heavy lifting and straining with exercise and work. Do not hold your breath when you perform mild to moderate lifting and exercise  activities. Limit your activities as directed by your health care provider.  Do Kegel exercises as directed by your health care provider. To do this: ? Squeeze your pelvic floor muscles tight. You should feel a tight lift in your rectal area and a tightness in your vaginal area. Keep your stomach, buttocks, and legs relaxed. ? Hold the muscles tight for up to 10 seconds. Then relax your muscles. ? Repeat this exercise 50 times a day, or as much as told by your health care provider. Continue to do this exercise for at least 4-6 weeks, or for as long as told by your health care provider. General instructions  Take over-the-counter and prescription medicines only as told by your health care provider.  Wear a sanitary pad or adult diapers if you have urinary incontinence.  If you have a pessary, take care of it as told by your health care provider.  Keep all follow-up visits. This is important. Contact a health care provider if you:  Have symptoms that interfere with your daily activities or sex life.  Need medicine to help with the discomfort.  Notice bleeding from your vagina that is not related to your menstrual  period.  Have a fever.  Have pain or bleeding when you urinate.  Have bleeding when you pass stool.  Pass urine when you have sex.  Have chronic constipation.  Have a pessary that falls out.  Have a foul-smelling vaginal discharge.  Have an unusual, low pain in your abdomen. Get help right away if you:  Cannot pass urine. Summary  Pelvic organ prolapse is the stretching, bulging, or dropping of pelvic organs into an abnormal position. It happens when the muscles and tissues that surround and support pelvic structures become weak or stretched.  When organs other than the vagina are involved, they often bulge into the vagina or protrude from it, depending on how severe the prolapse is.  In most cases, this condition needs to be treated only if it produces symptoms.  Treatment may include lifestyle changes, estrogen, Kegel exercises, pessary insertion, or surgery.  Avoid heavy lifting and straining with exercise and work. Do not hold your breath when you perform mild to moderate lifting and exercise activities. Limit your activities as directed by your health care provider. This information is not intended to replace advice given to you by your health care provider. Make sure you discuss any questions you have with your health care provider. Document Revised: 09/30/2019 Document Reviewed: 09/30/2019 Elsevier Patient Education  [place].

## 2020-07-02 NOTE — Progress Notes (Deleted)
Subjective:   Mikayla Washington is a 75 y.o. female who presents for Medicare Annual (Subsequent) preventive examination.  Review of Systems    ***       Objective:    Today's Vitals   07/02/20 0917  BP: 100/67  Pulse: 96  Temp: 97.9 F (36.6 C)  SpO2: 100%  Weight: 130 lb 9.6 oz (59.2 kg)  Height: 5\' 3"  (1.6 m)   Body mass index is 23.13 kg/m.  No flowsheet data found.  Current Medications (verified) Outpatient Encounter Medications as of 07/02/2020  Medication Sig  . Multiple Vitamins-Minerals (CENTRUM SILVER PO) Take 1 tablet by mouth daily.  . Vitamin D, Ergocalciferol, (DRISDOL) 1.25 MG (50000 UNIT) CAPS capsule Take one tablet wkly   No facility-administered encounter medications on file as of 07/02/2020.    Allergies (verified) Patient has no known allergies.   History: Past Medical History:  Diagnosis Date  . Adenomatous colon polyp 2013   Dr Sharlett Iles  . Breast disease    fibrocystic  . DJD (degenerative joint disease)    Past Surgical History:  Procedure Laterality Date  . ABDOMINAL HYSTERECTOMY    . BREAST LUMPECTOMY     X 3  . COLONOSCOPY  2002    Dr Sharlett Iles, negative  . colonoscopy with polypectomy  2013   adenomatous polyp  . TONSILLECTOMY    . TOTAL ABDOMINAL HYSTERECTOMY W/ BILATERAL SALPINGOOPHORECTOMY  2001    benign ovarian tumor (BSO also)   Family History  Adopted: Yes   Social History   Socioeconomic History  . Marital status: Married    Spouse name: Not on file  . Number of children: Not on file  . Years of education: Not on file  . Highest education level: Not on file  Occupational History  . Not on file  Tobacco Use  . Smoking status: Never Smoker  . Smokeless tobacco: Never Used  Vaping Use  . Vaping Use: Never used  Substance and Sexual Activity  . Alcohol use: No    Comment: rare, 1 x per year or at celebrations   . Drug use: No  . Sexual activity: Yes    Birth control/protection: Surgical  Other Topics  Concern  . Not on file  Social History Narrative  . Not on file   Social Determinants of Health   Financial Resource Strain: Not on file  Food Insecurity: Not on file  Transportation Needs: Not on file  Physical Activity: Not on file  Stress: Not on file  Social Connections: Not on file    Tobacco Counseling Counseling given: Not Answered   Clinical Intake:                 Diabetic?***         Activities of Daily Living No flowsheet data found.  Patient Care Team: Lorrene Reid, PA-C as PCP - General Calvert Cantor, MD as Consulting Physician (Ophthalmology) Sable Feil, MD as Consulting Physician (Gastroenterology)  Indicate any recent Medical Services you may have received from other than [facility] providers in the past year (date may be approximate).     Assessment:   This is a routine wellness examination for Mikayla Washington.  Hearing/Vision screen No exam data present  Dietary issues and exercise activities discussed:    Goals   None    Depression Screen PHQ 2/9 Scores 07/22/2019 04/04/2019 03/29/2019 09/27/2018 05/28/2018 05/14/2018 03/28/2018  PHQ - 2 Score 0 0 0 0 0 0 0  PHQ- 9  Score 0 0 1 0 0 0 0    Fall Risk Fall Risk  04/04/2019 03/28/2018 05/03/2017 01/17/2017 03/24/2016  Falls in the past year? 0 0 No Yes No  Number falls in past yr: 0 - - 1 -  Injury with Fall? 0 - - Yes -  Follow up Falls evaluation completed - - - -    FALL RISK PREVENTION PERTAINING TO THE HOME:  Any stairs in or around the home? {YES/NO:21197} If so, are there any without handrails? {YES/NO:21197} Home free of loose throw rugs in walkways, pet beds, electrical cords, etc? {YES/NO:21197} Adequate lighting in your home to reduce risk of falls? {YES/NO:21197}  ASSISTIVE DEVICES UTILIZED TO PREVENT FALLS:  Life alert? {YES/NO:21197} Use of a cane, walker or w/c? {YES/NO:21197} Grab bars in the bathroom? {YES/NO:21197} Shower chair or bench in shower?  {YES/NO:21197} Elevated toilet seat or a handicapped toilet? {YES/NO:21197}  TIMED UP AND GO:  Was the test performed? {YES/NO:21197}.  Length of time to ambulate 10 feet: *** sec.   {Appearance of XLKG:4010272}  Cognitive Function:     6CIT Screen 04/04/2019 03/28/2018 03/24/2016  What Year? 0 points 0 points 0 points  What month? 0 points 0 points 0 points  What time? 0 points 0 points 0 points  Count back from 20 0 points 0 points 0 points  Months in reverse 0 points 0 points 0 points  Repeat phrase 0 points 0 points 0 points  Total Score 0 0 0    Immunizations Immunization History  Administered Date(s) Administered  . Influenza, High Dose Seasonal PF 12/23/2017, 12/08/2018  . Influenza,inj,Quad PF,6+ Mos 02/18/2014  . Influenza-Unspecified 01/08/2016, 12/23/2017  . PFIZER(Purple Top)SARS-COV-2 Vaccination 05/24/2019, 06/14/2019  . Pneumococcal Conjugate-13 11/28/2014, 02/05/2019  . Pneumococcal Polysaccharide-23 04/09/2007, 02/28/2011  . Td 11/16/2001  . Tdap 03/24/2016  . Zoster 05/07/2007  . Zoster Recombinat (Shingrix) 05/04/2017, 07/14/2017    {TDAP status:2101805}  {Flu Vaccine status:2101806}  {Pneumococcal vaccine status:2101807}  {Covid-19 vaccine status:2101808}  Qualifies for Shingles Vaccine? {YES/NO:21197}  Zostavax completed {YES/NO:21197}  {Shingrix Completed?:2101804}  Screening Tests Health Maintenance  Topic Date Due  . COVID-19 Vaccine (3 - Pfizer risk 4-dose series) 07/12/2019  . INFLUENZA VACCINE  11/17/2019  . MAMMOGRAM  02/12/2022  . COLONOSCOPY (Pts 45-43yrs Insurance coverage will need to be confirmed)  03/20/2022  . TETANUS/TDAP  03/24/2026  . DEXA SCAN  Completed  . Hepatitis C Screening  Completed  . PNA vac Low Risk Adult  Completed  . HPV VACCINES  Aged Out    Health Maintenance  Health Maintenance Due  Topic Date Due  . COVID-19 Vaccine (3 - Pfizer risk 4-dose series) 07/12/2019  . INFLUENZA VACCINE  11/17/2019     {Colorectal cancer screening:2101809}  {Mammogram status:21018020}  {Bone Density status:21018021}  Lung Cancer Screening: (Low Dose CT Chest recommended if Age 21-80 years, 30 pack-year currently smoking OR have quit w/in 15years.) {DOES NOT does:27190::"does not"} qualify.   Lung Cancer Screening Referral: ***  Additional Screening:  Hepatitis C Screening: {DOES NOT does:27190::"does not"} qualify; Completed ***  Vision Screening: Recommended annual ophthalmology exams for early detection of glaucoma and other disorders of the eye. Is the patient up to date with their annual eye exam?  {YES/NO:21197} Who is the provider or what is the name of the office in which the patient attends annual eye exams? *** If pt is not established with a provider, would they like to be referred to a provider to establish care? {YES/NO:21197}.  Dental Screening: Recommended annual dental exams for proper oral hygiene  Community Resource Referral / Chronic Care Management: CRR required this visit?  {YES/NO:21197}  CCM required this visit?  {YES/NO:21197}     Plan:     I have personally reviewed and noted the following in the patient's chart:   . Medical and social history . Use of alcohol, tobacco or illicit drugs  . Current medications and supplements . Functional ability and status . Nutritional status . Physical activity . Advanced directives . List of other physicians . Hospitalizations, surgeries, and ER visits in previous 12 months . Vitals . Screenings to include cognitive, depression, and falls . Referrals and appointments  In addition, I have reviewed and discussed with patient certain preventive protocols, quality metrics, and best practice recommendations. A written personalized care plan for preventive services as well as general preventive health recommendations were provided to patient.     Vivia [place], [place]   07/02/2020   Nurse Notes: ***

## 2020-07-02 NOTE — Progress Notes (Signed)
Established Patient Office Visit  Subjective:  Patient ID: Mikayla Washington, female    DOB: [birth year]  Age: 75 y.o. MRN: [id number]  CC:  Chief Complaint  Patient presents with  . Annual Exam  . Medicare Wellness    HPI [name] presents for annual complete physical exam. Today, she c/o right shoulder pain. She states that it hurts the most when reaching her arm behind her back to fasten her bra or put on her coat. Also hurts more when reaching her right arm far in front of her. She denies injury to the right arm or trauma. She states that this has been bothering her for the last month or so and gradually getting worse.  She states that she can still use her right arm. This pain is not limiting her from most of her routine activities.  The patient states that she had surgery 10/31/2019 to repair rectocele and bladder prolapse. She feels like something is just not correct. Feels like her intestines may be "dropping" again. She feels pressure and discomfort in vaginal area. Feels bulging sensation in vaginal area again. She has seen GYN and GYN surgeon for this and is unsure if she needs to have a new referral to get this checked out.  Due to have a colonoscopy. She does not feel like her bowels have been right since she had the surgery to fix the rectocele. She states that she is frequently constipated and this will alternate with very loose stools. States that she will wear a pad when going out of the home. There are times when she has a loose bowel movement without the sensation of needing to use the bathroom or with very little warning of needing to use the bathroom.  The patient denies chest pain, chest pressure, or palpitations. She denies headaches, blurry vision, or dizziness. She is due to have fasting blood work done. She should also be scheduled for medicare wellness visit.    Past Medical History:  Diagnosis Date  . Adenomatous colon polyp 2013   Dr Sharlett Iles  . Breast disease     fibrocystic  . DJD (degenerative joint disease)     Past Surgical History:  Procedure Laterality Date  . ABDOMINAL HYSTERECTOMY    . BREAST LUMPECTOMY     X 3  . COLONOSCOPY  2002    Dr Sharlett Iles, negative  . colonoscopy with polypectomy  2013   adenomatous polyp  . TONSILLECTOMY    . TOTAL ABDOMINAL HYSTERECTOMY W/ BILATERAL SALPINGOOPHORECTOMY  2001    benign ovarian tumor (BSO also)    Family History  Adopted: Yes    Social History   Socioeconomic History  . Marital status: Married    Spouse name: Not on file  . Number of children: Not on file  . Years of education: Not on file  . Highest education level: Not on file  Occupational History  . Not on file  Tobacco Use  . Smoking status: Never Smoker  . Smokeless tobacco: Never Used  Vaping Use  . Vaping Use: Never used  Substance and Sexual Activity  . Alcohol use: No    Comment: rare, 1 x per year or at celebrations   . Drug use: No  . Sexual activity: Yes    Birth control/protection: Surgical  Other Topics Concern  . Not on file  Social History Narrative  . Not on file   Social Determinants of Health   Financial Resource Strain: Not on file  Food Insecurity: Not on file  Transportation Needs: Not on file  Physical Activity: Not on file  Stress: Not on file  Social Connections: Not on file  Intimate Partner Violence: Not on file    Outpatient Medications Prior to Visit  Medication Sig Dispense Refill  . Multiple Vitamins-Minerals (CENTRUM SILVER PO) Take 1 tablet by mouth daily.    . Vitamin D, Ergocalciferol, (DRISDOL) 1.25 MG (50000 UNIT) CAPS capsule Take one tablet wkly 12 capsule 3   No facility-administered medications prior to visit.    No Known Allergies  ROS Review of Systems  Constitutional: Negative for activity change, chills and fever.  HENT: Negative for congestion, postnasal drip, rhinorrhea, sinus pain and sore throat.   Respiratory: Negative for cough, shortness of breath and  wheezing.   Cardiovascular: Negative for chest pain and palpitations.  Gastrointestinal: Positive for constipation, diarrhea and rectal pain.       Rectal discomfort   Endocrine: Negative for cold intolerance, heat intolerance, polydipsia and polyuria.  Genitourinary: Positive for dysuria, frequency and urgency.  Musculoskeletal: Positive for arthralgias. Negative for back pain and myalgias.       Right shoulder pain. Worse when reaching her hand behind her and when stretching it out far in front of her.   Skin: Negative for rash.       Small lesion on the right trunk area which has rough texture.   Allergic/Immunologic: Negative for environmental allergies.  Neurological: Negative for dizziness, weakness and headaches.  Hematological: Negative for adenopathy.  Psychiatric/Behavioral: Negative for decreased concentration. The patient is not nervous/anxious.   All other systems reviewed and are negative.     Objective:    Physical Exam Vitals and nursing note reviewed.  Constitutional:      Appearance: Normal appearance. She is well-developed.  HENT:     Head: Normocephalic and atraumatic.     Right Ear: Ear canal and external ear normal.     Left Ear: Ear canal and external ear normal.     Nose: Nose normal.  Eyes:     Conjunctiva/sclera: Conjunctivae normal.     Pupils: Pupils are equal, round, and reactive to light.  Neck:     Vascular: No carotid bruit.  Cardiovascular:     Rate and Rhythm: Normal rate and regular rhythm.     Pulses: Normal pulses.     Heart sounds: Normal heart sounds.  Pulmonary:     Effort: Pulmonary effort is normal.     Breath sounds: Normal breath sounds.  Abdominal:     General: Bowel sounds are normal.     Palpations: Abdomen is soft.     Tenderness: There is no abdominal tenderness.  Musculoskeletal:        General: Normal range of motion.     Cervical back: Normal range of motion and neck supple.     Comments: Right shoulder tenderness.  Scratch test positive on right side. ROM and strength of the right arm is limited when lifting the arm past 90 degrees in any direction. No palpable abnormalities or deformities are noted.   Skin:    General: Skin is warm and dry.     Capillary Refill: Capillary refill takes less than 2 seconds.     Findings: Lesion present.     Comments: Small, round, slightly brown lesion present on right flank. Smooth edges, measures about 38mm in diameter. Non tender with no erythema present.   Neurological:     General: No focal deficit present.  Mental Status: She is alert and oriented to person, place, and time.     Today's Vitals   07/02/20 0917  BP: 100/67  Pulse: 96  Temp: 97.9 F (36.6 C)  SpO2: 100%  Weight: 130 lb 9.6 oz (59.2 kg)  Height: 5\' 3"  (1.6 m)   Body mass index is 23.13 kg/m.    Wt Readings from Last 3 Encounters:  07/02/20 130 lb 9.6 oz (59.2 kg)  05/06/20 130 lb (59 kg)  07/22/19 125 lb 8 oz (56.9 kg)     Health Maintenance Due  Topic Date Due  . COVID-19 Vaccine (3 - Pfizer risk 4-dose series) 07/12/2019  . INFLUENZA VACCINE  11/17/2019    There are no preventive care reminders to display for this patient.  Lab Results  Component Value Date   TSH 1.940 04/02/2019   Lab Results  Component Value Date   WBC 6.3 04/02/2019   HGB 12.8 04/02/2019   HCT 38.0 04/02/2019   MCV 88 04/02/2019   PLT 217 04/02/2019   Lab Results  Component Value Date   NA 140 04/02/2019   K 4.3 04/02/2019   CO2 24 04/02/2019   GLUCOSE 86 04/02/2019   BUN 17 04/02/2019   CREATININE 0.72 04/02/2019   BILITOT 0.3 04/02/2019   ALKPHOS 99 04/02/2019   AST 13 04/02/2019   ALT 10 04/02/2019   PROT 6.3 04/02/2019   ALBUMIN 4.4 04/02/2019   CALCIUM 9.5 04/02/2019   ANIONGAP 9 03/26/2019   GFR 76.71 11/28/2014   Lab Results  Component Value Date   CHOL 171 04/02/2019   Lab Results  Component Value Date   HDL 65 04/02/2019   Lab Results  Component Value Date    LDLCALC 94 04/02/2019   Lab Results  Component Value Date   TRIG 60 04/02/2019   Lab Results  Component Value Date   CHOLHDL 2.6 04/02/2019   Lab Results  Component Value Date   HGBA1C 5.7 (H) 04/02/2019      Assessment & Plan:  1. Health care maintenance Annual health maintenance exam today. Routine,fasting blood work drawn in the office today, - POCT Urinalysis Dipstick - CBC with Differential/Platelet - Comprehensive metabolic panel - T4, free - TSH - Lipid panel  2. Acute pain of right shoulder Suspect injury to rotator cuff. Refer to orthopedic provider for further evaluation and treatment.  - Ambulatory referral to Orthopedic Surgery  3. Dysuria Urine sample positive for small white blood cells. Will send for culture and treat as indicated.  - POCT Urinalysis Dipstick - Urine Culture  4. Bladder prolapse, female, acquired Likely contributing to dysuria. Will refer back to GYN for further evaluation and treatment.  - Ambulatory referral to Gynecology  5. Change in bowel habits Refer to GI for screening colonoscopy and further evaluation and treatment.  - Ambulatory referral to Gastroenterology  6. Screening for colon cancer Refer to gi for further evaluation.  - Ambulatory referral to Gastroenterology  7. Vitamin D deficiency Check vitamin d level today. Treat as indicated.  - Vitamin D 1,25 dihydroxy   Problem List Items Addressed This Visit      Genitourinary   Bladder prolapse, female, acquired   Relevant Orders   Ambulatory referral to Gynecology     Other   Vitamin D deficiency (Chronic)   Relevant Orders   Vitamin D 1,25 dihydroxy   Health care maintenance - Primary   Relevant Orders   POCT Urinalysis Dipstick (Completed)   CBC with Differential/Platelet  Comprehensive metabolic panel   T4, free   TSH   Lipid panel   Acute pain of right shoulder   Relevant Orders   Ambulatory referral to Orthopedic Surgery   Dysuria   Relevant  Orders   POCT Urinalysis Dipstick (Completed)   Urine Culture   Change in bowel habits   Relevant Orders   Ambulatory referral to Gastroenterology   Screening for colon cancer   Relevant Orders   Ambulatory referral to Gastroenterology     Time spent with the patient was approximately 45 minutes. This time included reviewing progress notes, labs, imaging studies, and discussing plan for follow up.   Follow-up: Return in about 4 months (around 11/01/2020) for medicare wellness.    Ronnell Freshwater, NP

## 2020-07-02 NOTE — Progress Notes (Deleted)
.  well

## 2020-07-02 NOTE — Progress Notes (Signed)
Sending urine sample for culture.

## 2020-07-03 NOTE — Progress Notes (Signed)
Very mild elevation of LDL and total cholesterol. Otherwise, labs good. Waiting on all results.

## 2020-07-04 LAB — URINE CULTURE

## 2020-07-09 ENCOUNTER — Ambulatory Visit: Payer: Medicare PPO | Admitting: Family Medicine

## 2020-07-09 ENCOUNTER — Encounter: Payer: Self-pay | Admitting: Family Medicine

## 2020-07-09 ENCOUNTER — Other Ambulatory Visit: Payer: Self-pay

## 2020-07-09 DIAGNOSIS — M25511 Pain in right shoulder: Secondary | ICD-10-CM

## 2020-07-09 NOTE — Progress Notes (Signed)
Office Visit Note   Patient: Mikayla Washington           Date of Birth: 1945/10/25           MRN: [id number] Visit Date: 07/09/2020 Requested by: Lorrene Reid, PA-C [facility] [place],  [place] [id number] PCP: Lorrene Reid, PA-C  Subjective: Chief Complaint  Patient presents with  . Right Shoulder - Pain    Pain and decreased ROM (especially behind her back) post fall on the ice 2 months ago --- it was a gradual problem for her. Some difficulty sleeping. Had this issue with the left shoulder years ago (went to [employer]) -- got better with PT.     HPI: She is here with right shoulder pain.  2 months ago she slipped and fell on ice.  She had no pain in her shoulder at that point.  It was not until a month later that she started noticing pain when reaching behind her back.  Now she is losing range of motion in her shoulder.  Sometimes the pain makes it hard to sleep at night.  She is left-hand dominant.  She had a similar problem with her left shoulder years ago and was treated with physical therapy at [place] and did very well.  She has never had problems with the right shoulder.  She is otherwise in good health, she is very active with her church, [employer], and still works part-time.               ROS:   All other systems were reviewed and are negative.  Objective: Vital Signs: There were no vitals taken for this visit.  Physical Exam:  General:  Alert and oriented, in no acute distress. Pulm:  Breathing unlabored. Psy:  Normal mood, congruent affect. Skin: No rash Right shoulder: She has slightly limited behind the back reach compared to the left.  She has pain with passive abduction and internal as well as external rotation.  Isometric rotator cuff strength is 5/5 throughout and does not cause any pain.  Speeds test is negative.  Mild tenderness in the posterior subacromial space.   Imaging: No results found.  Assessment &  Plan: 1.  Right shoulder impingement with adhesive capsulitis.  Doubt rotator cuff tear or fracture given the delay in onset of pain relative to when she fell. -Discussed options with her and elected to try a subacromial injection followed by physical therapy at [facility].  If she fails to improve we will then order x-rays and possibly image with ultrasound.     Procedures: Right shoulder subacromial injection: After sterile prep with Betadine, injected 3 cc 0.25% bupivacaine and 6 mg betamethasone from posterior approach.       PMFS History: Patient Active Problem List   Diagnosis Date Noted  . Health care maintenance 07/02/2020  . Acute pain of right shoulder 07/02/2020  . Dysuria 07/02/2020  . Bladder prolapse, female, acquired 07/02/2020  . Change in bowel habits 07/02/2020  . Screening for colon cancer 07/02/2020  . Vaginal atrophy 09/12/2019  . Enterocele 09/11/2019  . Osteoporosis without current pathological fracture 07/22/2019  . Pelvic floor relaxation 07/02/2019  . Estrogen deficiency 04/04/2019  . Postmenopausal bone loss 04/04/2019  . Counseled about COVID-19 virus infection 03/29/2019  . Tingling sensation chest into L arm 03/29/2019  . Atypical nevi * 2 10/03/2018  . Prediabetes 09/27/2018  . Irregular pigmentation of skin 09/27/2018  . Cough in adult 05/14/2018  .  Productive cough 11/22/2016  . Status post hysterectomy with oophorectomy in 2000 02/22/2016  . Elevated HDL 02/22/2016  . Adopted person 02/22/2016  . Eustachian tube dysfunction 05/31/2014  . Liver cyst 02/18/2014  . Chronic constipation 01/11/2014  . HLD (hyperlipidemia) 11/26/2013  . Hx of adenomatous colonic polyps 11/02/2012  . Vitamin D deficiency 11/02/2012  . Osteopenia 11/02/2012  . Irritable bowel syndrome (IBS) 11/02/2012  . Tinnitus, subjective 11/02/2012  . Menopausal and postmenopausal disorder 11/03/2008  . Osteoarthritis 11/03/2008   Past Medical History:  Diagnosis  Date  . Adenomatous colon polyp 2013   Dr Sharlett Iles  . Breast disease    fibrocystic  . DJD (degenerative joint disease)     Family History  Adopted: Yes    Past Surgical History:  Procedure Laterality Date  . ABDOMINAL HYSTERECTOMY    . BREAST LUMPECTOMY     X 3  . COLONOSCOPY  2002    Dr Sharlett Iles, negative  . colonoscopy with polypectomy  2013   adenomatous polyp  . TONSILLECTOMY    . TOTAL ABDOMINAL HYSTERECTOMY W/ BILATERAL SALPINGOOPHORECTOMY  2001    benign ovarian tumor (BSO also)   Social History   Occupational History  . Not on file  Tobacco Use  . Smoking status: Never Smoker  . Smokeless tobacco: Never Used  Vaping Use  . Vaping Use: Never used  Substance and Sexual Activity  . Alcohol use: No    Comment: rare, 1 x per year or at celebrations   . Drug use: No  . Sexual activity: Yes    Birth control/protection: Surgical

## 2020-07-12 LAB — CBC WITH DIFFERENTIAL/PLATELET
Basophils Absolute: 0.1 10*3/uL (ref 0.0–0.2)
Basos: 1 %
EOS (ABSOLUTE): 0.2 10*3/uL (ref 0.0–0.4)
Eos: 2 %
Hematocrit: 41.7 % (ref 34.0–46.6)
Hemoglobin: 13.7 g/dL (ref 11.1–15.9)
Immature Grans (Abs): 0 10*3/uL (ref 0.0–0.1)
Immature Granulocytes: 0 %
Lymphocytes Absolute: 2 10*3/uL (ref 0.7–3.1)
Lymphs: 31 %
MCH: 29.7 pg (ref 26.6–33.0)
MCHC: 32.9 g/dL (ref 31.5–35.7)
MCV: 90 fL (ref 79–97)
Monocytes Absolute: 0.5 10*3/uL (ref 0.1–0.9)
Monocytes: 8 %
Neutrophils Absolute: 3.7 10*3/uL (ref 1.4–7.0)
Neutrophils: 58 %
Platelets: 246 10*3/uL (ref 150–450)
RBC: 4.62 x10E6/uL (ref 3.77–5.28)
RDW: 13.4 % (ref 11.7–15.4)
WBC: 6.4 10*3/uL (ref 3.4–10.8)

## 2020-07-12 LAB — LIPID PANEL
Chol/HDL Ratio: 2.9 ratio (ref 0.0–4.4)
Cholesterol, Total: 201 mg/dL — ABNORMAL HIGH (ref 100–199)
HDL: 70 mg/dL (ref >39)
LDL Chol Calc (NIH): 119 mg/dL — ABNORMAL HIGH (ref 0–99)
Triglycerides: 65 mg/dL (ref 0–149)
VLDL Cholesterol Cal: 12 mg/dL (ref 5–40)

## 2020-07-12 LAB — COMPREHENSIVE METABOLIC PANEL
ALT: 9 IU/L (ref 0–32)
AST: 17 IU/L (ref 0–40)
Albumin/Globulin Ratio: 2 (ref 1.2–2.2)
Albumin: 4.6 g/dL (ref 3.7–4.7)
Alkaline Phosphatase: 102 IU/L (ref 44–121)
BUN/Creatinine Ratio: 21 (ref 12–28)
BUN: 16 mg/dL (ref 8–27)
Bilirubin Total: 0.4 mg/dL (ref 0.0–1.2)
CO2: 23 mmol/L (ref 20–29)
Calcium: 9.8 mg/dL (ref 8.7–10.3)
Chloride: 103 mmol/L (ref 96–106)
Creatinine, Ser: 0.78 mg/dL (ref 0.57–1.00)
Globulin, Total: 2.3 g/dL (ref 1.5–4.5)
Glucose: 89 mg/dL (ref 65–99)
Potassium: 4.3 mmol/L (ref 3.5–5.2)
Sodium: 142 mmol/L (ref 134–144)
Total Protein: 6.9 g/dL (ref 6.0–8.5)
eGFR: 80 mL/min/{1.73_m2} (ref >59)

## 2020-07-12 LAB — TSH: TSH: 1.77 u[IU]/mL (ref 0.450–4.500)

## 2020-07-12 LAB — T4, FREE: Free T4: 1.31 ng/dL (ref 0.82–1.77)

## 2020-07-12 LAB — VITAMIN D 1,25 DIHYDROXY
Vitamin D 1, 25 (OH)2 Total: 36 pg/mL
Vitamin D2 1, 25 (OH)2: 23 pg/mL
Vitamin D3 1, 25 (OH)2: 13 pg/mL

## 2020-07-20 ENCOUNTER — Encounter: Payer: Self-pay | Admitting: Physician Assistant

## 2020-07-30 DIAGNOSIS — M7501 Adhesive capsulitis of right shoulder: Secondary | ICD-10-CM | POA: Diagnosis not present

## 2020-07-30 DIAGNOSIS — M25511 Pain in right shoulder: Secondary | ICD-10-CM | POA: Diagnosis not present

## 2020-08-06 DIAGNOSIS — M7501 Adhesive capsulitis of right shoulder: Secondary | ICD-10-CM | POA: Diagnosis not present

## 2020-08-06 DIAGNOSIS — M25511 Pain in right shoulder: Secondary | ICD-10-CM | POA: Diagnosis not present

## 2020-08-13 DIAGNOSIS — M25511 Pain in right shoulder: Secondary | ICD-10-CM | POA: Diagnosis not present

## 2020-08-13 DIAGNOSIS — M7501 Adhesive capsulitis of right shoulder: Secondary | ICD-10-CM | POA: Diagnosis not present

## 2020-08-19 ENCOUNTER — Encounter: Payer: Self-pay | Admitting: Internal Medicine

## 2020-08-20 DIAGNOSIS — M7501 Adhesive capsulitis of right shoulder: Secondary | ICD-10-CM | POA: Diagnosis not present

## 2020-08-20 DIAGNOSIS — M25511 Pain in right shoulder: Secondary | ICD-10-CM | POA: Diagnosis not present

## 2020-09-03 DIAGNOSIS — M7501 Adhesive capsulitis of right shoulder: Secondary | ICD-10-CM | POA: Diagnosis not present

## 2020-09-03 DIAGNOSIS — M25511 Pain in right shoulder: Secondary | ICD-10-CM | POA: Diagnosis not present

## 2020-09-10 DIAGNOSIS — M7501 Adhesive capsulitis of right shoulder: Secondary | ICD-10-CM | POA: Diagnosis not present

## 2020-09-10 DIAGNOSIS — M25511 Pain in right shoulder: Secondary | ICD-10-CM | POA: Diagnosis not present

## 2020-09-15 ENCOUNTER — Encounter: Payer: Self-pay | Admitting: *Deleted

## 2020-09-17 DIAGNOSIS — M7501 Adhesive capsulitis of right shoulder: Secondary | ICD-10-CM | POA: Diagnosis not present

## 2020-09-17 DIAGNOSIS — M25511 Pain in right shoulder: Secondary | ICD-10-CM | POA: Diagnosis not present

## 2020-09-19 DIAGNOSIS — Z20822 Contact with and (suspected) exposure to covid-19: Secondary | ICD-10-CM | POA: Diagnosis not present

## 2020-09-30 ENCOUNTER — Encounter: Payer: Self-pay | Admitting: Internal Medicine

## 2020-09-30 ENCOUNTER — Other Ambulatory Visit: Payer: Self-pay

## 2020-09-30 ENCOUNTER — Ambulatory Visit: Payer: Medicare PPO | Admitting: Internal Medicine

## 2020-09-30 DIAGNOSIS — R198 Other specified symptoms and signs involving the digestive system and abdomen: Secondary | ICD-10-CM | POA: Diagnosis not present

## 2020-09-30 DIAGNOSIS — Z86010 Personal history of colonic polyps: Secondary | ICD-10-CM | POA: Diagnosis not present

## 2020-09-30 MED ORDER — SUPREP BOWEL PREP KIT 17.5-3.13-1.6 GM/177ML PO SOLN: 1.0000 | ORAL | 0 refills | Status: DC

## 2020-09-30 NOTE — Progress Notes (Signed)
Patient ID: Mikayla Washington, female   DOB: December 28, 1945, 75 y.o.   MRN: [id number] HPI: Mikayla Washington is a 75 year-old with a history of adenomatous polyps, enterocele and rectocele status post repair who is seen to discuss alternating bowel habits and colonoscopy surveillance.  She is here alone today.  She is known to me from her surveillance colonoscopy which I performed on 03/20/2017.  This revealed 3 polyps ranging from 6 to 8 mm in size which were removed with cold snare.  These were adenomatous.  The exam was otherwise normal.  She reports that she met with Dr. Maryland Pink with urogynecology and underwent a posterior colporrhaphy and sacrospinous ligament fixation on 10/31/2019.  She reports this went well but she feels like things are "still not quite right".  She will also soon follow-up with Dr. Zigmund Daniel.  She reports that she has alternating constipation with loose stools and fecal urgency.  She has had some anal pain and low volume red blood with wiping intermittently.  She uses Desitin to the perianal skin to help with irritation.  At times she feels a lower abdominal discomfort.  Since her surgery she notes she is having more regular bowel movements but still alternates with diarrhea and constipation.  No upper GI or hepatobiliary complaint including no dysphagia, frequent heartburn, nausea, vomiting, early satiety or poor appetite.  Past Medical History:  Diagnosis Date   Adenomatous colon polyp 2013   Dr Sharlett Iles   Breast disease    fibrocystic   DJD (degenerative joint disease)     Past Surgical History:  Procedure Laterality Date   ABDOMINAL HYSTERECTOMY     ANTERIOR AND POSTERIOR VAGINAL REPAIR     BREAST LUMPECTOMY     X 3   COLONOSCOPY  2002    Dr Sharlett Iles, negative   colonoscopy with polypectomy  2013   adenomatous polyp   RECTOCELE REPAIR     TONSILLECTOMY     TOTAL ABDOMINAL HYSTERECTOMY W/ BILATERAL SALPINGOOPHORECTOMY  2001    benign ovarian tumor (BSO also)     Outpatient Medications Prior to Visit  Medication Sig Dispense Refill   Multiple Vitamins-Minerals (CENTRUM SILVER PO) Take 1 tablet by mouth daily.     Vitamin D, Ergocalciferol, (DRISDOL) 1.25 MG (50000 UNIT) CAPS capsule Take one tablet wkly 12 capsule 3   No facility-administered medications prior to visit.    Not on File  Family History  Adopted: Yes    Social History   Tobacco Use   Smoking status: Never   Smokeless tobacco: Never  Vaping Use   Vaping Use: Never used  Substance Use Topics   Alcohol use: No    Comment: rare, 1 x per year or at celebrations    Drug use: No    ROS: As per history of present illness, otherwise negative  BP (!) 106/50   Pulse 94   Ht $R'5\' 3"'Ol$  (1.6 m)   Wt 131 lb (59.4 kg)   BMI 23.21 kg/m  Gen: awake, alert, NAD HEENT: anicteric, CV: RRR, no mrg Pulm: CTA b/l Abd: soft, NT/ND, +BS throughout Ext: no c/c/e Neuro: nonfocal  RELEVANT LABS AND IMAGING: CBC    Component Value Date/Time   WBC 6.4 07/02/2020 1021   WBC 5.9 03/26/2019 1008   RBC 4.62 07/02/2020 1021   RBC 4.26 03/26/2019 1008   HGB 13.7 07/02/2020 1021   HCT 41.7 07/02/2020 1021   PLT 246 07/02/2020 1021   MCV 90 07/02/2020 1021   MCH  29.7 07/02/2020 1021   MCH 29.8 03/26/2019 1008   MCHC 32.9 07/02/2020 1021   MCHC 32.2 03/26/2019 1008   RDW 13.4 07/02/2020 1021   LYMPHSABS 2.0 07/02/2020 1021   MONOABS 0.4 03/26/2019 1008   EOSABS 0.2 07/02/2020 1021   BASOSABS 0.1 07/02/2020 1021    CMP     Component Value Date/Time   NA 142 07/02/2020 1021   K 4.3 07/02/2020 1021   CL 103 07/02/2020 1021   CO2 23 07/02/2020 1021   GLUCOSE 89 07/02/2020 1021   GLUCOSE 100 (H) 03/26/2019 1008   BUN 16 07/02/2020 1021   CREATININE 0.78 07/02/2020 1021   CREATININE 0.75 03/24/2016 1619   CALCIUM 9.8 07/02/2020 1021   PROT 6.9 07/02/2020 1021   ALBUMIN 4.6 07/02/2020 1021   AST 17 07/02/2020 1021   ALT 9 07/02/2020 1021   ALKPHOS 102 07/02/2020 1021    BILITOT 0.4 07/02/2020 1021   GFRNONAA 83 04/02/2019 0913   GFRNONAA 81 03/24/2016 1619   GFRAA 96 04/02/2019 0913   GFRAA >89 03/24/2016 1619    ASSESSMENT/PLAN: 75 year-old with a history of adenomatous polyps, enterocele and rectocele status post repair who is seen to discuss alternating bowel habits and colonoscopy surveillance.  Alternating diarrhea and constipation --likely an irritable bowel type symptoms but this also may relate to her anatomy.  She is due surveillance colonoscopy, see #2.  I think she would benefit from fiber supplementation. --Citrucel 1 working to 2 heaping tablespoons daily --Colonoscopy as below  2.  History of adenomatous colon polyps --surveillance colonoscopy slightly overdue and recommended today.  We reviewed the risk, benefits and alternatives and she is agreeable and wishes to proceed --Colonoscopy in the [facility]  3.  History of rectocele/enterocele --she will follow-up with urogynecology, Dr. Zigmund Daniel, in the coming month    ZX:[facility], [place], [address]. [place],  [place] [id number]

## 2020-09-30 NOTE — Patient Instructions (Addendum)
If you are age 75 or older, your body mass index should be between 23-30. Your Body mass index is 23.21 kg/m. If this is out of the aforementioned range listed, please consider follow up with your Primary Care Provider. __________________________________________________________  The [facility] providers would like to encourage you to use [facility] to communicate with providers for non-urgent requests or questions.  Due to long hold times on the telephone, sending your provider a message by [facility] may be a faster and more efficient way to get a response.  Please allow 48 business hours for a response.  Please remember that this is for non-urgent requests.  __________________________________________________________  Mikayla Washington have been scheduled for a colonoscopy. Please follow written instructions given to you at your visit today.  Please pick up your prep supplies at the pharmacy within the next 1-3 days. If you use inhalers (even only as needed), please bring them with you on the day of your procedure.  Due to recent changes in healthcare laws, you may see the results of your imaging and laboratory studies on MyChart before your provider has had a chance to review them.  We understand that in some cases there may be results that are confusing or concerning to you. Not all laboratory results come back in the same time frame and the provider may be waiting for multiple results in order to interpret others.  Please give [place] 48 hours in order for your provider to thoroughly review all the results before contacting the office for clarification of your results.   Please start taking citrucel (orange flavored) powder fiber supplement.  This may cause some bloating at first but that usually goes away. Begin with one tablespoon and work your way up to two spoonfuls.  HOLD Citrucel 5 days prior to colonoscopy.  We have sent a referral to Dr Sharlet Salina.  Someone should contact with you regarding the appointment in 1 to 2  weeks.  Please contact our office at [phone] if they do not reach out to.  Thank you for entrusting me with your care and choosing [facility].  Dr Hilarie Fredrickson

## 2020-11-05 ENCOUNTER — Ambulatory Visit: Payer: Medicare PPO | Admitting: Nurse Practitioner

## 2020-11-12 ENCOUNTER — Ambulatory Visit: Payer: Medicare PPO | Admitting: Nurse Practitioner

## 2020-12-16 ENCOUNTER — Encounter: Payer: Self-pay | Admitting: Internal Medicine

## 2020-12-16 ENCOUNTER — Other Ambulatory Visit: Payer: Self-pay

## 2020-12-16 ENCOUNTER — Ambulatory Visit (AMBULATORY_SURGERY_CENTER): Payer: Medicare PPO | Admitting: Internal Medicine

## 2020-12-16 VITALS — BP 139/76 | HR 87 | Temp 97.1°F | Resp 12 | Ht 63.0 in | Wt 131.0 lb

## 2020-12-16 DIAGNOSIS — Z86010 Personal history of colonic polyps: Secondary | ICD-10-CM | POA: Diagnosis not present

## 2020-12-16 DIAGNOSIS — R198 Other specified symptoms and signs involving the digestive system and abdomen: Secondary | ICD-10-CM

## 2020-12-16 DIAGNOSIS — D120 Benign neoplasm of cecum: Secondary | ICD-10-CM

## 2020-12-16 MED ORDER — SODIUM CHLORIDE 0.9 % IV SOLN: 500.0000 mL | Freq: Once | INTRAVENOUS | Status: DC

## 2020-12-16 NOTE — Progress Notes (Signed)
GASTROENTEROLOGY PROCEDURE H&P NOTE   Primary Care Physician: Lorrene Reid, PA-C    Reason for Procedure:  History of colon polyps and alternating bowel habits  Plan:    Colonoscopy  Patient is appropriate for endoscopic procedure(s) in the ambulatory ([facility]) setting.  The nature of the procedure, as well as the risks, benefits, and alternatives were carefully and thoroughly reviewed with the patient. Ample time for discussion and questions allowed. The patient understood, was satisfied, and agreed to proceed.     HPI: Mikayla Washington is a 75 y.o. female who presents for colonoscopy for polyp surveillance.  Medical history as below.  No complaints today including no recent chest pain or shortness of breath.  No significant changes in medical history since she was seen in the office in June of this year.  Past Medical History:  Diagnosis Date   Adenomatous colon polyp 04/19/2011   Dr Sharlett Iles   Breast disease    fibrocystic   DJD (degenerative joint disease)    Mitral valve prolapse    Osteopenia     Past Surgical History:  Procedure Laterality Date   ABDOMINAL HYSTERECTOMY     ANTERIOR AND POSTERIOR VAGINAL REPAIR     BREAST LUMPECTOMY     X 3   CATARACT EXTRACTION Left    COLONOSCOPY  2002   Dr Sharlett Iles, negative   colonoscopy with polypectomy  2013   adenomatous polyp   RECTOCELE REPAIR     TONSILLECTOMY     TOTAL ABDOMINAL HYSTERECTOMY W/ BILATERAL SALPINGOOPHORECTOMY  2001    benign ovarian tumor (BSO also)    Prior to Admission medications   Medication Sig Start Date End Date Taking? Authorizing Provider  Multiple Vitamins-Minerals (CENTRUM SILVER PO) Take 1 tablet by mouth daily.   Yes [provider]  Vitamin D, Ergocalciferol, (DRISDOL) 1.25 MG (50000 UNIT) CAPS capsule Take one tablet wkly 12/09/19  Yes Lorrene Reid, PA-C    Current Outpatient Medications  Medication Sig Dispense Refill   Multiple Vitamins-Minerals (CENTRUM SILVER PO)  Take 1 tablet by mouth daily.     Vitamin D, Ergocalciferol, (DRISDOL) 1.25 MG (50000 UNIT) CAPS capsule Take one tablet wkly 12 capsule 3   Current Facility-Administered Medications  Medication Dose Route Frequency Provider Last Rate Last Admin   0.9 %  sodium chloride infusion  500 mL Intravenous Once Orlandus Borowski, Lajuan Lines, MD        Allergies as of 12/16/2020   (No Known Allergies)    Family History  Adopted: Yes    Social History   Socioeconomic History   Marital status: Married    Spouse name: Not on file   Number of children: Not on file   Years of education: Not on file   Highest education level: Not on file  Occupational History   Occupation: Retired Pharmacist, hospital  Tobacco Use   Smoking status: Never   Smokeless tobacco: Never  Vaping Use   Vaping Use: Never used  Substance and Sexual Activity   Alcohol use: No    Comment: rare, 1 x per year or at celebrations    Drug use: No   Sexual activity: Yes    Birth control/protection: Surgical  Other Topics Concern   Not on file  Social History Narrative   Not on file   Social Determinants of Health   Financial Resource Strain: Not on file  Food Insecurity: Not on file  Transportation Needs: Not on file  Physical Activity: Not on file  Stress:  Not on file  Social Connections: Not on file  Intimate Partner Violence: Not on file    Physical Exam: Vital signs in last 24 hours: '@BP'$  92/62   Pulse 93   Temp (!) 97.1 F (36.2 C)   Ht '5\' 3"'$  (1.6 m)   Wt 131 lb (59.4 kg)   SpO2 96%   BMI 23.21 kg/m  GEN: NAD EYE: Sclerae anicteric ENT: MMM CV: Non-tachycardic Pulm: CTA b/l GI: Soft, NT/ND NEURO:  Alert & Oriented x 3   Zenovia Jarred, MD [facility]  12/16/2020 1:37 PM

## 2020-12-16 NOTE — Op Note (Signed)
[facility] Patient Name: Mikayla Washington Procedure Date: 12/16/2020 1:21 PM MRN: [id number] Endoscopist: Jerene Bears , MD Age: 75 Referring MD:  Date of Birth: 04-22-1945 Gender: Female Account #: [id number] Procedure:                Colonoscopy Indications:              High risk colon cancer surveillance: Personal                            history of non-advanced adenoma, Last colonoscopy:                            December 2018 Medicines:                Monitored Anesthesia Care Procedure:                Pre-Anesthesia Assessment:                           - Prior to the procedure, a History and Physical                            was performed, and patient medications and                            allergies were reviewed. The patient's tolerance of                            previous anesthesia was also reviewed. The risks                            and benefits of the procedure and the sedation                            options and risks were discussed with the patient.                            All questions were answered, and informed consent                            was obtained. Prior Anticoagulants: The patient has                            taken no previous anticoagulant or antiplatelet                            agents. ASA Grade Assessment: II - A patient with                            mild systemic disease. After reviewing the risks                            and benefits, the patient was deemed in  satisfactory condition to undergo the procedure.                           After obtaining informed consent, the colonoscope                            was passed under direct vision. Throughout the                            procedure, the patient's blood pressure, pulse, and                            oxygen saturations were monitored continuously. The                            0441 PCF-H190TL Slim SB Colonoscope was introduced                             through the anus and advanced to the cecum,                            identified by appendiceal orifice and ileocecal                            valve. The colonoscopy was performed without                            difficulty. The patient tolerated the procedure                            well. The quality of the bowel preparation was                            good. The ileocecal valve, appendiceal orifice, and                            rectum were photographed. Scope In: 1:41:09 PM Scope Out: 2:05:02 PM Scope Withdrawal Time: 0 hours 12 minutes 59 seconds  Total Procedure Duration: 0 hours 23 minutes 53 seconds  Findings:                 The digital rectal exam was normal.                           A 3 mm polyp was found in the cecum. The polyp was                            sessile. The polyp was removed with a cold snare.                            Resection and retrieval were complete.                           The exam was otherwise without abnormality on  direct and retroflexion views. Complications:            No immediate complications. Estimated Blood Loss:     Estimated blood loss was minimal. Impression:               - One 3 mm polyp in the cecum, removed with a cold                            snare. Resected and retrieved.                           - The examination was otherwise normal on direct                            and retroflexion views. Recommendation:           - Patient has a contact number available for                            emergencies. The signs and symptoms of potential                            delayed complications were discussed with the                            patient. Return to normal activities tomorrow.                            Written discharge instructions were provided to the                            patient.                           - Resume previous diet.                            - Continue present medications.                           - Await pathology results.                           - No repeat colonoscopy due to age at next                            screening/surveillance interval (> 80 yrs). Jerene Bears, MD 12/16/2020 2:12:42 PM This report has been signed electronically.

## 2020-12-16 NOTE — Patient Instructions (Addendum)
Handout on polyps given to you today  Await pathology results on polyp removed    Try Metamucil 2 teaspoons ( fiber supplement ) in 8 oz water or more 1-2 times a day- per Dr Hilarie Fredrickson    YOU HAD AN ENDOSCOPIC PROCEDURE TODAY AT THE [facility]:   Refer to the procedure report that was given to you for any specific questions about what was found during the examination.  If the procedure report does not answer your questions, please call your gastroenterologist to clarify.  If you requested that your care partner not be given the details of your procedure findings, then the procedure report has been included in a sealed envelope for you to review at your convenience later.  YOU SHOULD EXPECT: Some feelings of bloating in the abdomen. Passage of more gas than usual.  Walking can help get rid of the air that was put into your GI tract during the procedure and reduce the bloating. If you had a lower endoscopy (such as a colonoscopy or flexible sigmoidoscopy) you may notice spotting of blood in your stool or on the toilet paper. If you underwent a bowel prep for your procedure, you may not have a normal bowel movement for a few days.  Please Note:  You might notice some irritation and congestion in your nose or some drainage.  This is from the oxygen used during your procedure.  There is no need for concern and it should clear up in a day or so.  SYMPTOMS TO REPORT IMMEDIATELY:  Following lower endoscopy (colonoscopy or flexible sigmoidoscopy):  Excessive amounts of blood in the stool  Significant tenderness or worsening of abdominal pains  Swelling of the abdomen that is new, acute  Fever of 100F or higher  For urgent or emergent issues, a gastroenterologist can be reached at any hour by calling [phone]. Do not use MyChart messaging for urgent concerns.    DIET:  We do recommend a small meal at first, but then you may proceed to your regular diet.  Drink plenty of fluids  but you should avoid alcoholic beverages for 24 hours.  ACTIVITY:  You should plan to take it easy for the rest of today and you should NOT DRIVE or use heavy machinery until tomorrow (because of the sedation medicines used during the test).    FOLLOW UP: Our staff will call the number listed on your records 48-72 hours following your procedure to check on you and address any questions or concerns that you may have regarding the information given to you following your procedure. If we do not reach you, we will leave a message.  We will attempt to reach you two times.  During this call, we will ask if you have developed any symptoms of COVID 19. If you develop any symptoms (ie: fever, flu-like symptoms, shortness of breath, cough etc.) before then, please call [phone].  If you test positive for Covid 19 in the 2 weeks post procedure, please call and report this information to [place].    If any biopsies were taken you will be contacted by phone or by letter within the next 1-3 weeks.  Please call us at [phone] if you have not heard about the biopsies in 3 weeks.    SIGNATURES/CONFIDENTIALITY: You and/or your care partner have signed paperwork which will be entered into your electronic medical record.  These signatures attest to the fact that that the information above on your After Visit Summary  has been reviewed and is understood.  Full responsibility of the confidentiality of this discharge information lies with you and/or your care-partner.  

## 2020-12-16 NOTE — Progress Notes (Signed)
Called to room to assist during endoscopic procedure.  Patient ID and intended procedure confirmed with present staff. Received instructions for my participation in the procedure from the performing physician.  

## 2020-12-16 NOTE — Progress Notes (Signed)
VS- Mikayla Washington. °

## 2020-12-16 NOTE — Progress Notes (Signed)
Sedate, gd SR's, VSS, report to RN 

## 2020-12-18 ENCOUNTER — Telehealth: Payer: Self-pay | Admitting: *Deleted

## 2020-12-18 NOTE — Telephone Encounter (Signed)
  Follow up Call-  Call back number 12/16/2020  Post procedure Call Back phone  # [phone]  Permission to leave phone message Yes  Some recent data might be hidden     Patient questions:  Do you have a fever, pain , or abdominal swelling? No. Pain Score  0 *  Have you tolerated food without any problems? Yes.    Have you been able to return to your normal activities? Yes.    Do you have any questions about your discharge instructions: Diet   No. Medications  No. Follow up visit  No.  Do you have questions or concerns about your Care? No.  Actions: * If pain score is 4 or above: No action needed, pain <4.  Have you developed a fever since your procedure? no  2.   Have you had an respiratory symptoms (SOB or cough) since your procedure? no  3.   Have you tested positive for COVID 19 since your procedure no  4.   Have you had any family members/close contacts diagnosed with the COVID 19 since your procedure?  no   If yes to any of these questions please route to Joylene John, RN and Joella Prince, RN

## 2020-12-22 ENCOUNTER — Encounter: Payer: Self-pay | Admitting: Internal Medicine

## 2020-12-24 ENCOUNTER — Ambulatory Visit (INDEPENDENT_AMBULATORY_CARE_PROVIDER_SITE_OTHER): Payer: Medicare PPO | Admitting: Physician Assistant

## 2020-12-24 ENCOUNTER — Encounter: Payer: Self-pay | Admitting: Physician Assistant

## 2020-12-24 ENCOUNTER — Other Ambulatory Visit: Payer: Self-pay

## 2020-12-24 VITALS — BP 106/68 | HR 81 | Temp 98.4°F | Ht 64.0 in | Wt 130.9 lb

## 2020-12-24 DIAGNOSIS — E559 Vitamin D deficiency, unspecified: Secondary | ICD-10-CM | POA: Diagnosis not present

## 2020-12-24 DIAGNOSIS — R140 Abdominal distension (gaseous): Secondary | ICD-10-CM

## 2020-12-24 DIAGNOSIS — Z0000 Encounter for general adult medical examination without abnormal findings: Secondary | ICD-10-CM | POA: Diagnosis not present

## 2020-12-24 DIAGNOSIS — Z23 Encounter for immunization: Secondary | ICD-10-CM

## 2020-12-24 DIAGNOSIS — R7303 Prediabetes: Secondary | ICD-10-CM

## 2020-12-24 DIAGNOSIS — K5900 Constipation, unspecified: Secondary | ICD-10-CM | POA: Diagnosis not present

## 2020-12-24 DIAGNOSIS — E785 Hyperlipidemia, unspecified: Secondary | ICD-10-CM

## 2020-12-24 NOTE — Progress Notes (Signed)
Subjective:   Mikayla Washington is a 75 y.o. female who presents for Medicare Annual (Subsequent) preventive examination.  Review of Systems    General:   No F/C, wt loss Pulm:   No DIB, SOB, pleuritic chest pain Card:  No CP, palpitations Abd:  No n/v/d, +bloating, +constipation Ext:  No inc edema from baseline   Objective:    Today's Vitals   12/24/20 1041 12/24/20 1305  BP: (!) 95/59 106/68  Pulse: 81   Temp: 98.4 F (36.9 C)   SpO2: 95%   Weight: 130 lb 14.4 oz (59.4 kg)   Height: '5\' 4"'$  (1.626 m)    Body mass index is 22.47 kg/m.  No flowsheet data found.  Physical exam: General:  Well Developed, well nourished, in no acute distress Neuro:  Alert and oriented,  extra-ocular muscles intact  HEENT:  Normocephalic, atraumatic, neck supple Skin:  no gross rash, warm, pink. Abdomen: +BS, non-tender, slight distension, negative fluid wave, no guarding or rebound tenderness  Cardiac:  RRR, S1 S2 Respiratory:  ECTA B/L and A/P, Not using accessory muscles, speaking in full sentences- unlabored. Vascular:  Ext warm, no cyanosis apprec.; cap RF less 2 sec. Psych:  No HI/SI, judgement and insight good, Euthymic mood. Full Affect.   Current Medications (verified) Outpatient Encounter Medications as of 12/24/2020  Medication Sig   Multiple Vitamins-Minerals (CENTRUM SILVER PO) Take 1 tablet by mouth daily.   Vitamin D, Ergocalciferol, (DRISDOL) 1.25 MG (50000 UNIT) CAPS capsule Take one tablet wkly   No facility-administered encounter medications on file as of 12/24/2020.    Allergies (verified) Patient has no known allergies.   History: Past Medical History:  Diagnosis Date   Adenomatous colon polyp 04/19/2011   Dr Sharlett Iles   Breast disease    fibrocystic   DJD (degenerative joint disease)    Mitral valve prolapse    Osteopenia    Past Surgical History:  Procedure Laterality Date   ABDOMINAL HYSTERECTOMY     ANTERIOR AND POSTERIOR VAGINAL REPAIR     BREAST  LUMPECTOMY     X 3   CATARACT EXTRACTION Left    COLONOSCOPY  2002   Dr Sharlett Iles, negative   colonoscopy with polypectomy  2013   adenomatous polyp   RECTOCELE REPAIR     TONSILLECTOMY     TOTAL ABDOMINAL HYSTERECTOMY W/ BILATERAL SALPINGOOPHORECTOMY  2001    benign ovarian tumor (BSO also)   Family History  Adopted: Yes   Social History   Socioeconomic History   Marital status: Married    Spouse name: Not on file   Number of children: Not on file   Years of education: Not on file   Highest education level: Not on file  Occupational History   Occupation: Retired Pharmacist, hospital  Tobacco Use   Smoking status: Never   Smokeless tobacco: Never  Vaping Use   Vaping Use: Never used  Substance and Sexual Activity   Alcohol use: No    Comment: rare, 1 x per year or at celebrations    Drug use: No   Sexual activity: Yes    Birth control/protection: Surgical  Other Topics Concern   Not on file  Social History Narrative   Not on file   Social Determinants of Health   Financial Resource Strain: Not on file  Food Insecurity: Not on file  Transportation Needs: Not on file  Physical Activity: Not on file  Stress: Not on file  Social Connections: Not on file  Tobacco Counseling Counseling given: Not Answered     Diabetic?no   Activities of Daily Living In your present state of health, do you have any difficulty performing the following activities: 12/24/2020 07/02/2020  Hearing? N N  Vision? N N  Difficulty concentrating or making decisions? N N  Walking or climbing stairs? N N  Dressing or bathing? N N  Doing errands, shopping? N N  Some recent data might be hidden    Patient Care Team: Lorrene Reid, PA-C as PCP - General Calvert Cantor, MD as Consulting Physician (Ophthalmology) Sable Feil, MD as Consulting Physician (Gastroenterology)  Indicate any recent Medical Services you may have received from other than [facility] providers in the past year (date may  be approximate).     Assessment:   This is a routine wellness examination for Mikayla Washington.  Hearing/Vision screen No results found.  Dietary issues and exercise activities discussed: -Recommend to continue with stretches and exercises. Follow a heart healthy diet and monitor simple carbohydrates. Stay well hydrated.   Goals Addressed   None   Depression Screen PHQ 2/9 Scores 12/24/2020 07/02/2020 07/22/2019 04/04/2019 03/29/2019 09/27/2018 05/28/2018  PHQ - 2 Score 0 0 0 0 0 0 0  PHQ- 9 Score 0 0 0 0 1 0 0    Fall Risk Fall Risk  12/24/2020 07/02/2020 04/04/2019 03/28/2018 05/03/2017  Falls in the past year? 0 1 0 0 No  Number falls in past yr: 0 - 0 - -  Injury with Fall? 0 - 0 - -  Risk for fall due to : No Fall Risks - - - -  Follow up Falls evaluation completed Falls evaluation completed Falls evaluation completed - -    FALL RISK PREVENTION PERTAINING TO THE HOME:  Any stairs in or around the home? Yes  If so, are there any without handrails? Yes  Home free of loose throw rugs in walkways, pet beds, electrical cords, etc? Yes  Adequate lighting in your home to reduce risk of falls? Yes   ASSISTIVE DEVICES UTILIZED TO PREVENT FALLS:  Life alert? No  Use of a cane, walker or w/c? No  Grab bars in the bathroom? Yes  Shower chair or bench in shower? Yes  Elevated toilet seat or a handicapped toilet? Yes   TIMED UP AND GO:  Was the test performed? Yes .  Length of time to ambulate 10 feet: 12 sec.   Gait steady and fast without use of assistive device  Cognitive Function: wnl's     6CIT Screen 12/24/2020 04/04/2019 03/28/2018 03/24/2016  What Year? 0 points 0 points 0 points 0 points  What month? 0 points 0 points 0 points 0 points  What time? 0 points 0 points 0 points 0 points  Count back from 20 0 points 0 points 0 points 0 points  Months in reverse 0 points 0 points 0 points 0 points  Repeat phrase 0 points 0 points 0 points 0 points  Total Score 0 0 0 0     Immunizations Immunization History  Administered Date(s) Administered   Fluad Quad(high Dose 65+) 12/24/2020   Influenza, High Dose Seasonal PF 12/23/2017, 12/08/2018   Influenza,inj,Quad PF,6+ Mos 02/18/2014   Influenza-Unspecified 01/08/2016, 12/23/2017   PFIZER(Purple Top)SARS-COV-2 Vaccination 05/24/2019, 06/14/2019   Pneumococcal Conjugate-13 11/28/2014, 02/05/2019   Pneumococcal Polysaccharide-23 04/09/2007, 02/28/2011   Td 11/16/2001   Tdap 03/24/2016   Zoster Recombinat (Shingrix) 05/04/2017, 07/14/2017   Zoster, Live 05/07/2007    TDAP status: Up to date  Flu Vaccine status: Completed at today's visit  Pneumococcal vaccine status: Up to date  Covid-19 vaccine status: Completed vaccines  Qualifies for Shingles Vaccine? Yes   Zostavax completed Yes   Shingrix Completed?: Yes  Screening Tests Health Maintenance  Topic Date Due   COVID-19 Vaccine (3 - Pfizer risk series) 07/12/2019   TETANUS/TDAP  03/24/2026   COLONOSCOPY (Pts 45-76yr Insurance coverage will need to be confirmed)  12/17/2027   INFLUENZA VACCINE  Completed   DEXA SCAN  Completed   Hepatitis C Screening  Completed   PNA vac Low Risk Adult  Completed   Zoster Vaccines- Shingrix  Completed   HPV VACCINES  Aged Out    Health Maintenance  Health Maintenance Due  Topic Date Due   COVID-19 Vaccine (3 - Pfizer risk series) 07/12/2019    Colorectal cancer screening: No longer required.   Mammogram status: No longer required due to age.  Bone Density status: Completed 06/20/2019. Results reflect: Bone density results: OSTEOPOROSIS. Repeat every 5 years.  Lung Cancer Screening: (Low Dose CT Chest recommended if Age 75-80years, 30 pack-year currently smoking OR have quit w/in 15years.) does not qualify.   Lung Cancer Screening Referral: n/a  Additional Screening:  Hepatitis C Screening: does qualify; Completed 03/24/2016  Vision Screening: Recommended annual ophthalmology exams for early  detection of glaucoma and other disorders of the eye. Is the patient up to date with their annual eye exam?  No  plans to schedule  Who is the provider or what is the name of the office in which the patient attends annual eye exams? Dr. DBing PlumeIf pt is not established with a provider, would they like to be referred to a provider to establish care? No .   Dental Screening: Recommended annual dental exams for proper oral hygiene  Community Resource Referral / Chronic Care Management: CRR required this visit?  No   CCM required this visit?  No      Plan:  -Patient has c/o abdominal bloating and is concerned given hx of benign ovarian tumor. Patient is s/p hysterectomy. Will place order for abd pelvis CT w/ contrast for further evaluation. UTD with colonoscopy, benign poly. -Will collect fasting labs. -Pending Vitamin D results, will send additional refills of Vitamin D once a week if indicated. -Recommend to continue with metamucil and probiotic. -Follow up in 4 months for HLD, Vitamin D def  I have personally reviewed and noted the following in the patient's chart:   Medical and social history Use of alcohol, tobacco or illicit drugs  Current medications and supplements including opioid prescriptions.  Functional ability and status Nutritional status Physical activity Advanced directives List of other physicians Hospitalizations, surgeries, and ER visits in previous 12 months Vitals Screenings to include cognitive, depression, and falls Referrals and appointments  In addition, I have reviewed and discussed with patient certain preventive protocols, quality metrics, and best practice recommendations. A written personalized care plan for preventive services as well as general preventive health recommendations were provided to patient.     MLorrene Reid PA-C   12/24/2020

## 2020-12-24 NOTE — Patient Instructions (Signed)
Preventive Care 40 Years and Older, Female Preventive care refers to lifestyle choices and visits with your health care provider that can promote health and wellness. This includes: A yearly physical exam. This is also called an annual wellness visit. Regular dental and eye exams. Immunizations. Screening for certain conditions. Healthy lifestyle choices, such as: Eating a healthy diet. Getting regular exercise. Not using drugs or products that contain nicotine and tobacco. Limiting alcohol use. What can I expect for my preventive care visit? Physical exam Your health care provider will check your: Height and weight. These may be used to calculate your BMI (body mass index). BMI is a measurement that tells if you are at a healthy weight. Heart rate and blood pressure. Body temperature. Skin for abnormal spots. Counseling Your health care provider may ask you questions about your: Past medical problems. Family's medical history. Alcohol, tobacco, and drug use. Emotional well-being. Home life and relationship well-being. Sexual activity. Diet, exercise, and sleep habits. History of falls. Memory and ability to understand (cognition). Work and work Statistician. Pregnancy and menstrual history. Access to firearms. What immunizations do I need? Vaccines are usually given at various ages, according to a schedule. Your health care provider will recommend vaccines for you based on your age, medical history, and lifestyle or other factors, such as travel or where you work. What tests do I need? Blood tests Lipid and cholesterol levels. These may be checked every 5 years, or more often depending on your overall health. Hepatitis C test. Hepatitis B test. Screening Lung cancer screening. You may have this screening every year starting at age 75 if you have a 30-pack-year history of smoking and currently smoke or have quit within the past 15 years. Colorectal cancer screening. All  adults should have this screening starting at age 75 and continuing until age 75. Your health care provider may recommend screening at age 75 if you are at increased risk. You will have tests every 1-10 years, depending on your results and the type of screening test. Diabetes screening. This is done by checking your blood sugar (glucose) after you have not eaten for a while (fasting). You may have this done every 1-3 years. Mammogram. This may be done every 1-2 years. Talk with your health care provider about how often you should have regular mammograms. Abdominal aortic aneurysm (AAA) screening. You may need this if you are a current or former smoker. BRCA-related cancer screening. This may be done if you have a family history of breast, ovarian, tubal, or peritoneal cancers. Other tests STD (sexually transmitted disease) testing, if you are at risk. Bone density scan. This is done to screen for osteoporosis. You may have this done starting at age 75. Talk with your health care provider about your test results, treatment options, and if necessary, the need for more tests. Follow these instructions at home: Eating and drinking  Eat a diet that includes fresh fruits and vegetables, whole grains, lean protein, and low-fat dairy products. Limit your intake of foods with high amounts of sugar, saturated fats, and salt. Take vitamin and mineral supplements as recommended by your health care provider. Do not drink alcohol if your health care provider tells you not to drink. If you drink alcohol: Limit how much you have to 0-1 drink a day. Be aware of how much alcohol is in your drink. In the [place] one drink equals one 12 oz bottle of beer (355 mL), one 5 oz glass of wine (148 mL), or one  1 oz glass of hard liquor (44 mL). Lifestyle Take daily care of your teeth and gums. Brush your teeth every morning and night with fluoride toothpaste. Floss one time each day. Stay active. Exercise for at least  30 minutes 5 or more days each week. Do not use any products that contain nicotine or tobacco, such as cigarettes, e-cigarettes, and chewing tobacco. If you need help quitting, ask your health care provider. Do not use drugs. If you are sexually active, practice safe sex. Use a condom or other form of protection in order to prevent STIs (sexually transmitted infections). Talk with your health care provider about taking a low-dose aspirin or statin. Find healthy ways to cope with stress, such as: Meditation, yoga, or listening to music. Journaling. Talking to a trusted person. Spending time with friends and family. Safety Always wear your seat belt while driving or riding in a vehicle. Do not drive: If you have been drinking alcohol. Do not ride with someone who has been drinking. When you are tired or distracted. While texting. Wear a helmet and other protective equipment during sports activities. If you have firearms in your house, make sure you follow all gun safety procedures. What's next? Visit your health care provider once a year for an annual wellness visit. Ask your health care provider how often you should have your eyes and teeth checked. Stay up to date on all vaccines. This information is not intended to replace advice given to you by your health care provider. Make sure you discuss any questions you have with your health care provider. Document Revised: 06/12/2020 Document Reviewed: 03/29/2018 Elsevier Patient Education  2022 [employer].

## 2020-12-25 LAB — COMPREHENSIVE METABOLIC PANEL
ALT: 8 IU/L (ref 0–32)
AST: 14 IU/L (ref 0–40)
Albumin/Globulin Ratio: 2.1 (ref 1.2–2.2)
Albumin: 4.4 g/dL (ref 3.7–4.7)
Alkaline Phosphatase: 97 IU/L (ref 44–121)
BUN/Creatinine Ratio: 26 (ref 12–28)
BUN: 20 mg/dL (ref 8–27)
Bilirubin Total: 0.3 mg/dL (ref 0.0–1.2)
CO2: 24 mmol/L (ref 20–29)
Calcium: 9.6 mg/dL (ref 8.7–10.3)
Chloride: 102 mmol/L (ref 96–106)
Creatinine, Ser: 0.76 mg/dL (ref 0.57–1.00)
Globulin, Total: 2.1 g/dL (ref 1.5–4.5)
Glucose: 94 mg/dL (ref 65–99)
Potassium: 4.5 mmol/L (ref 3.5–5.2)
Sodium: 141 mmol/L (ref 134–144)
Total Protein: 6.5 g/dL (ref 6.0–8.5)
eGFR: 82 mL/min/{1.73_m2} (ref >59)

## 2020-12-25 LAB — LIPID PANEL
Chol/HDL Ratio: 2.8 ratio (ref 0.0–4.4)
Cholesterol, Total: 183 mg/dL (ref 100–199)
HDL: 65 mg/dL (ref >39)
LDL Chol Calc (NIH): 106 mg/dL — ABNORMAL HIGH (ref 0–99)
Triglycerides: 65 mg/dL (ref 0–149)
VLDL Cholesterol Cal: 12 mg/dL (ref 5–40)

## 2020-12-25 LAB — HEMOGLOBIN A1C
Est. average glucose Bld gHb Est-mCnc: 114 mg/dL
Hgb A1c MFr Bld: 5.6 % (ref 4.8–5.6)

## 2020-12-25 LAB — CBC WITH DIFFERENTIAL/PLATELET
Basophils Absolute: 0.1 10*3/uL (ref 0.0–0.2)
Basos: 1 %
EOS (ABSOLUTE): 0.1 10*3/uL (ref 0.0–0.4)
Eos: 2 %
Hematocrit: 39.9 % (ref 34.0–46.6)
Hemoglobin: 13.2 g/dL (ref 11.1–15.9)
Immature Grans (Abs): 0 10*3/uL (ref 0.0–0.1)
Immature Granulocytes: 0 %
Lymphocytes Absolute: 2.2 10*3/uL (ref 0.7–3.1)
Lymphs: 35 %
MCH: 29.5 pg (ref 26.6–33.0)
MCHC: 33.1 g/dL (ref 31.5–35.7)
MCV: 89 fL (ref 79–97)
Monocytes Absolute: 0.5 10*3/uL (ref 0.1–0.9)
Monocytes: 8 %
Neutrophils Absolute: 3.3 10*3/uL (ref 1.4–7.0)
Neutrophils: 54 %
Platelets: 224 10*3/uL (ref 150–450)
RBC: 4.48 x10E6/uL (ref 3.77–5.28)
RDW: 13.7 % (ref 11.7–15.4)
WBC: 6.1 10*3/uL (ref 3.4–10.8)

## 2020-12-25 LAB — VITAMIN D 25 HYDROXY (VIT D DEFICIENCY, FRACTURES): Vit D, 25-Hydroxy: 63.5 ng/mL (ref 30.0–100.0)

## 2020-12-25 LAB — TSH: TSH: 1.11 u[IU]/mL (ref 0.450–4.500)

## 2021-01-14 ENCOUNTER — Ambulatory Visit
Admission: RE | Admit: 2021-01-14 | Discharge: 2021-01-14 | Disposition: A | Discharge status: Home / Self Care | Payer: Medicare PPO | Source: Ambulatory Visit | Attending: Physician Assistant | Admitting: Physician Assistant

## 2021-01-14 DIAGNOSIS — K7689 Other specified diseases of liver: Secondary | ICD-10-CM | POA: Diagnosis not present

## 2021-01-14 DIAGNOSIS — I878 Other specified disorders of veins: Secondary | ICD-10-CM | POA: Diagnosis not present

## 2021-01-14 DIAGNOSIS — R140 Abdominal distension (gaseous): Secondary | ICD-10-CM

## 2021-01-14 DIAGNOSIS — K5900 Constipation, unspecified: Secondary | ICD-10-CM | POA: Diagnosis not present

## 2021-01-14 MED ORDER — IOPAMIDOL (ISOVUE-300) INJECTION 61%
100.0000 mL | Freq: Once | INTRAVENOUS | Status: AC | PRN
  Administered 2021-01-14: 100 mL via INTRAVENOUS

## 2021-01-21 ENCOUNTER — Telehealth: Payer: Self-pay

## 2021-01-21 NOTE — Telephone Encounter (Signed)
-----   Message from Lorrene Reid, [place] sent at 01/19/2021  8:10 AM EDT ----- Please call Ms. Eldred and notify abdomen pelvis CT is negative for acute abnormality in the abdomen or pelvis. Does show findings suggestive of kidney and liver cysts. If patient has additional questions regarding GI findings recommend to discuss with her gastroenterologist. Left gonadal vein is enlarged and tortuous which can cause atypical venous drainage. Also has a small superficial cyst in the left pubic region.  Thank you, Herb Grays

## 2021-02-04 DIAGNOSIS — M810 Age-related osteoporosis without current pathological fracture: Secondary | ICD-10-CM | POA: Diagnosis not present

## 2021-02-04 DIAGNOSIS — R32 Unspecified urinary incontinence: Secondary | ICD-10-CM | POA: Diagnosis not present

## 2021-02-18 DIAGNOSIS — Z1231 Encounter for screening mammogram for malignant neoplasm of breast: Secondary | ICD-10-CM | POA: Diagnosis not present

## 2021-02-18 LAB — HM MAMMOGRAPHY

## 2021-02-19 ENCOUNTER — Other Ambulatory Visit: Payer: Self-pay

## 2021-02-19 ENCOUNTER — Ambulatory Visit: Payer: Medicare PPO | Admitting: Internal Medicine

## 2021-02-19 ENCOUNTER — Encounter: Payer: Self-pay | Admitting: Internal Medicine

## 2021-02-19 VITALS — BP 116/70 | HR 74 | Resp 18 | Ht 64.0 in | Wt 133.2 lb

## 2021-02-19 DIAGNOSIS — M810 Age-related osteoporosis without current pathological fracture: Secondary | ICD-10-CM

## 2021-02-19 DIAGNOSIS — K581 Irritable bowel syndrome with constipation: Secondary | ICD-10-CM

## 2021-02-19 DIAGNOSIS — Z86010 Personal history of colonic polyps: Secondary | ICD-10-CM

## 2021-02-19 DIAGNOSIS — E559 Vitamin D deficiency, unspecified: Secondary | ICD-10-CM

## 2021-02-19 NOTE — Patient Instructions (Signed)
We do not need any labs today.   

## 2021-02-19 NOTE — Assessment & Plan Note (Signed)
Doing metamucil for constipation prevention.

## 2021-02-19 NOTE — Progress Notes (Signed)
   Subjective:   Patient ID: Mikayla Washington, female    DOB: Aug 20, 1945, 75 y.o.   MRN: [id number]  HPI The patient is a new 75 YO female coming in.   PMH, [facility], social history reviewed and updated  Review of Systems  Constitutional: Negative.   HENT: Negative.    Eyes: Negative.   Respiratory:  Negative for cough, chest tightness and shortness of breath.   Cardiovascular:  Negative for chest pain, palpitations and leg swelling.  Gastrointestinal:  Negative for abdominal distention, abdominal pain, constipation, diarrhea, nausea and vomiting.  Musculoskeletal:  Positive for arthralgias.  Skin: Negative.   Neurological: Negative.   Psychiatric/Behavioral: Negative.     Objective:  Physical Exam Constitutional:      Appearance: She is well-developed.  HENT:     Head: Normocephalic and atraumatic.  Cardiovascular:     Rate and Rhythm: Normal rate and regular rhythm.  Pulmonary:     Effort: Pulmonary effort is normal. No respiratory distress.     Breath sounds: Normal breath sounds. No wheezing or rales.  Abdominal:     General: Bowel sounds are normal. There is no distension.     Palpations: Abdomen is soft.     Tenderness: There is no abdominal tenderness. There is no rebound.  Musculoskeletal:     Cervical back: Normal range of motion.  Skin:    General: Skin is warm and dry.  Neurological:     Mental Status: She is alert and oriented to person, place, and time.     Coordination: Coordination normal.    Vitals:   02/19/21 1002  BP: 116/70  Pulse: 74  Resp: 18  SpO2: 99%  Weight: 133 lb 3.2 oz (60.4 kg)  Height: 5\' 4"  (1.626 m)    This visit occurred during the SARS-CoV-2 public health emergency.  Safety protocols were in place, including screening questions prior to the visit, additional usage of staff PPE, and extensive cleaning of exam room while observing appropriate contact time as indicated for disinfecting solutions.   Assessment & Plan:  Visit time 30 minutes  in face to face communication with patient and coordination of care, additional 15 minutes spent in record review, coordination or care, ordering tests, communicating/referring to other healthcare professionals, documenting in medical records all on the same day of the visit for total time 45 minutes spent on the visit.

## 2021-02-19 NOTE — Assessment & Plan Note (Signed)
Reviewed recent colonoscopy findings and no further due to age.

## 2021-02-19 NOTE — Assessment & Plan Note (Signed)
Taking otc replacement.

## 2021-02-19 NOTE — Assessment & Plan Note (Signed)
She is taking calcium and vitamin D. Recent DEXA -3.2. Will review records further for past treatment.

## 2021-02-22 ENCOUNTER — Encounter: Payer: Self-pay | Admitting: Internal Medicine

## 2021-10-01 IMAGING — CR DG CHEST 2V
2 series · 2 of 2 positions shown · non-contrast
Comparison: 11/22/2016

CLINICAL DATA: Chest pain

EXAM:
CHEST - 2 VIEW

[w chest lat]
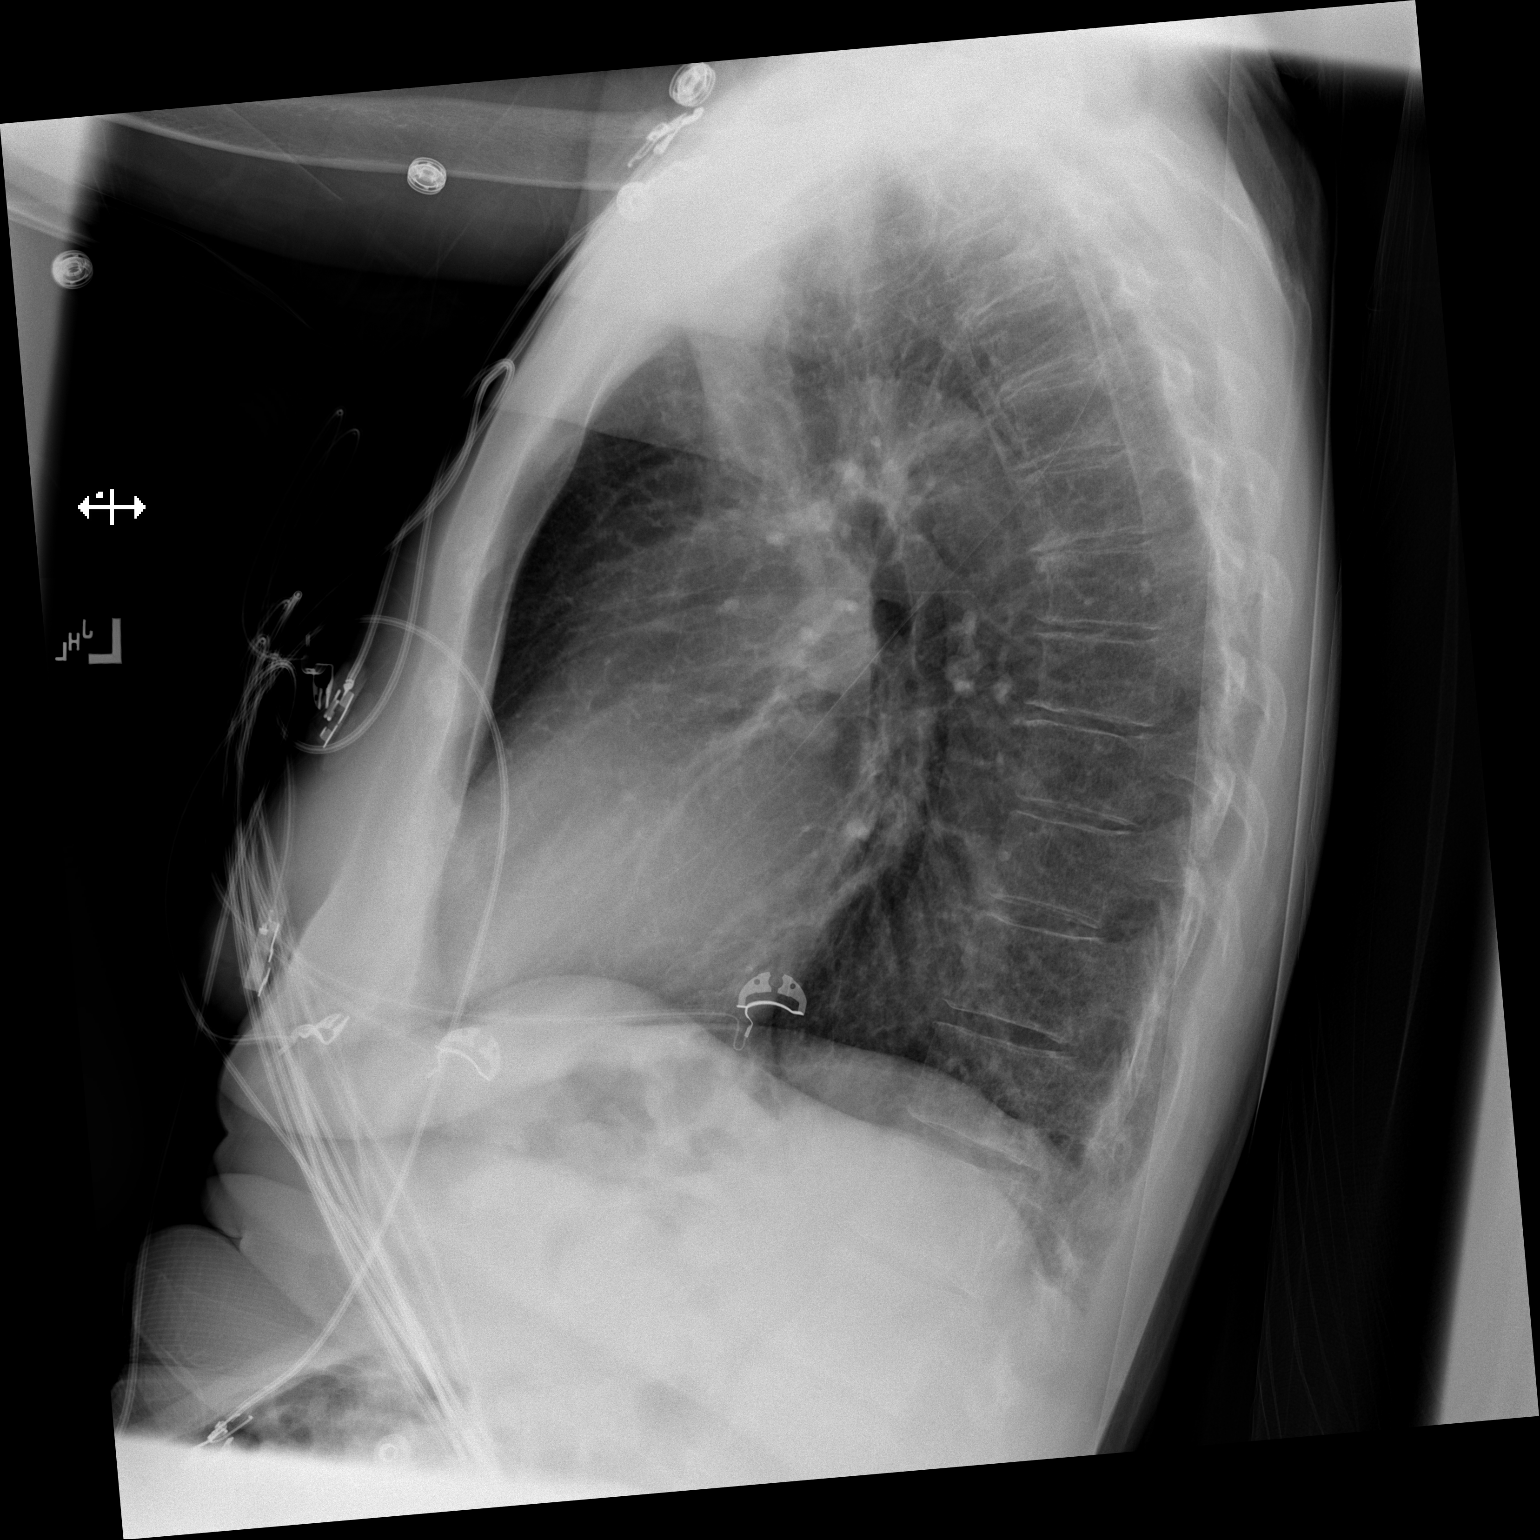

[x chest ap]
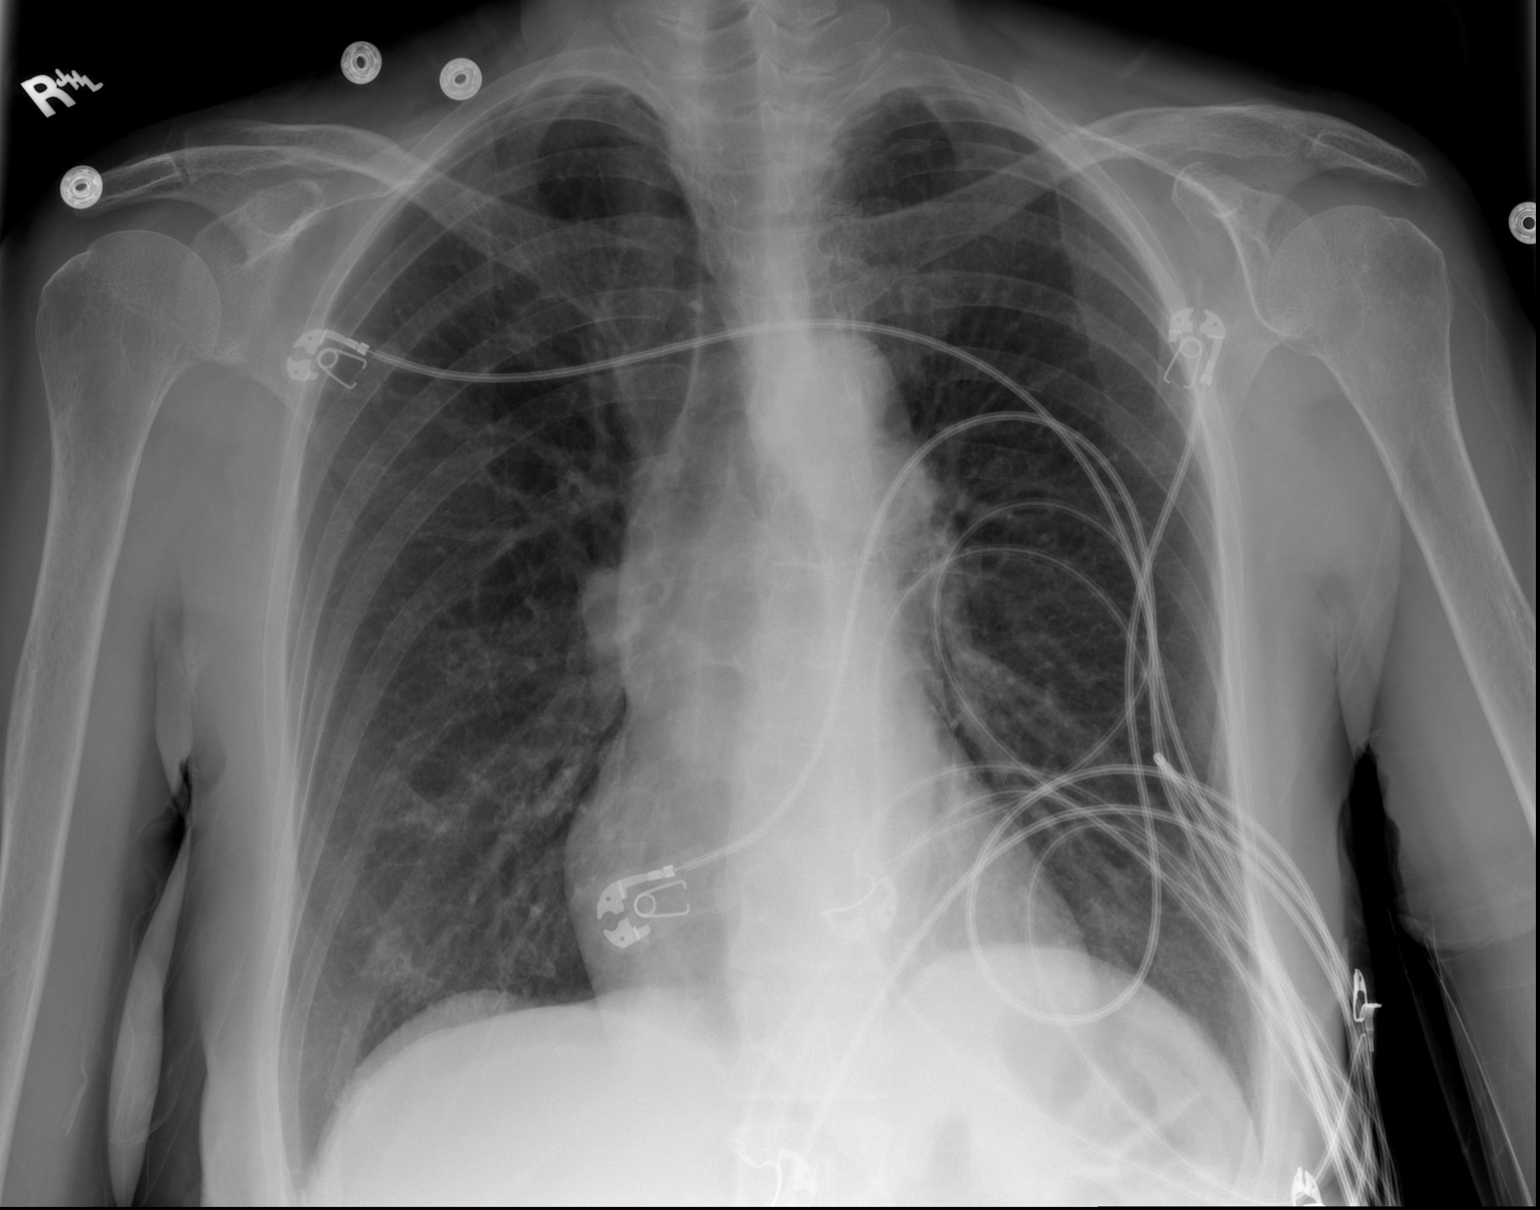

[2 of 2 positions shown; findings below may reference images not displayed]

FINDINGS: The heart size and mediastinal contours are within normal limits.
Both lungs are clear. The visualized skeletal structures are
unremarkable.
IMPRESSION: No acute abnormality of the lungs.

## 2021-10-17 ENCOUNTER — Emergency Department (HOSPITAL_COMMUNITY)
Admission: EM | Admit: 2021-10-17 | Discharge: 2021-10-17 | Disposition: A | Discharge status: Home / Self Care | Payer: Medicare PPO | Attending: Emergency Medicine | Admitting: Emergency Medicine

## 2021-10-17 ENCOUNTER — Encounter (HOSPITAL_COMMUNITY): Payer: Self-pay

## 2021-10-17 ENCOUNTER — Encounter: Payer: Self-pay | Admitting: Emergency Medicine

## 2021-10-17 ENCOUNTER — Other Ambulatory Visit: Payer: Self-pay

## 2021-10-17 ENCOUNTER — Ambulatory Visit
Admission: EM | Admit: 2021-10-17 | Discharge: 2021-10-17 | Disposition: A | Discharge status: Other Acute Inpatient Hospital | Payer: Medicare PPO

## 2021-10-17 DIAGNOSIS — H1032 Unspecified acute conjunctivitis, left eye: Secondary | ICD-10-CM | POA: Insufficient documentation

## 2021-10-17 DIAGNOSIS — H02846 Edema of left eye, unspecified eyelid: Secondary | ICD-10-CM | POA: Diagnosis not present

## 2021-10-17 DIAGNOSIS — H5712 Ocular pain, left eye: Secondary | ICD-10-CM

## 2021-10-17 DIAGNOSIS — H00014 Hordeolum externum left upper eyelid: Secondary | ICD-10-CM | POA: Diagnosis not present

## 2021-10-17 MED ORDER — ERYTHROMYCIN 5 MG/GM OP OINT: TOPICAL_OINTMENT | OPHTHALMIC | 0 refills | Status: DC

## 2021-10-17 MED ORDER — AMOXICILLIN-POT CLAVULANATE 875-125 MG PO TABS: 1.0000 | ORAL_TABLET | Freq: Two times a day (BID) | ORAL | 0 refills | Status: DC

## 2021-10-17 NOTE — Discharge Instructions (Signed)
Please go to the emergency department as soon as you leave urgent care for further evaluation and management. ?

## 2021-10-17 NOTE — ED Provider Notes (Signed)
[facility]    CSN: [id number] Arrival date & time: 10/17/21  1256      History   Chief Complaint Chief Complaint  Patient presents with   Facial Swelling    HPI Mikayla Washington is a 76 y.o. female.   Patient presents with complaints of severe left eye pain and eye swelling that started yesterday.  Denies any apparent injury or trauma to the eye.  She reports purulent drainage from the eye as well.  She does not wear contacts but does wear glasses.  She has taken Tylenol for pain.  She reports that she has significant pain when she moves her eyes back and forth as well.     Past Medical History:  Diagnosis Date   Adenomatous colon polyp 04/19/2011   Dr Sharlett Iles   Breast disease    fibrocystic   DJD (degenerative joint disease)    Mitral valve prolapse    Osteopenia     Patient Active Problem List   Diagnosis Date Noted   Health care maintenance 07/02/2020   Bladder prolapse, female, acquired 07/02/2020   Osteoporosis without current pathological fracture 07/22/2019   Atypical nevi * 2 10/03/2018   Liver cyst 02/18/2014   Hx of adenomatous colonic polyps 11/02/2012   Vitamin D deficiency 11/02/2012   Irritable bowel syndrome (IBS) 11/02/2012   Tinnitus, subjective 11/02/2012   Osteoarthritis 11/03/2008    Past Surgical History:  Procedure Laterality Date   ABDOMINAL HYSTERECTOMY     ANTERIOR AND POSTERIOR VAGINAL REPAIR     BREAST LUMPECTOMY     X 3   CATARACT EXTRACTION Left    COLONOSCOPY  2002   Dr Sharlett Iles, negative   colonoscopy with polypectomy  2013   adenomatous polyp   RECTOCELE REPAIR     TONSILLECTOMY     TOTAL ABDOMINAL HYSTERECTOMY W/ BILATERAL SALPINGOOPHORECTOMY  2001    benign ovarian tumor (BSO also)    OB History     Gravida  3   Para  2   Term  2   Preterm      AB  1   Living  2      SAB  1   IAB      Ectopic      Multiple      Live Births  2            Home Medications    Prior to  Admission medications   Medication Sig Start Date End Date Taking? Authorizing Provider  Multiple Vitamins-Minerals (CENTRUM SILVER PO) Take 1 tablet by mouth daily.   Yes [provider]  Vitamin D, Ergocalciferol, (DRISDOL) 1.25 MG (50000 UNIT) CAPS capsule Take one tablet wkly 12/09/19  Yes Lorrene Reid, PA-C    Family History Family History  Adopted: Yes    Social History Social History   Tobacco Use   Smoking status: Never   Smokeless tobacco: Never  Vaping Use   Vaping Use: Never used  Substance Use Topics   Alcohol use: No    Comment: rare, 1 x per year or at celebrations    Drug use: No     Allergies   Patient has no known allergies.   Review of Systems Review of Systems Per HPI  Physical Exam Triage Vital Signs ED Triage Vitals  Enc Vitals Group     BP 10/17/21 1324 125/71     Pulse Rate 10/17/21 1324 88     Resp 10/17/21 1324 18  Temp 10/17/21 1324 98.2 F (36.8 C)     Temp Source 10/17/21 1324 Oral     SpO2 10/17/21 1324 96 %     Weight 10/17/21 1326 130 lb (59 kg)     Height 10/17/21 1326 '5\' 4"'$  (1.626 m)     Head Circumference --      Peak Flow --      Pain Score 10/17/21 1326 7     Pain Loc --      Pain Edu? --      Excl. in [place]? --    No data found.  Updated Vital Signs BP 125/71 (BP Location: Left Arm)   Pulse 88   Temp 98.2 F (36.8 C) (Oral)   Resp 18   Ht '5\' 4"'$  (1.626 m)   Wt 130 lb (59 kg)   SpO2 96%   BMI 22.31 kg/m   Visual Acuity Right Eye Distance:   Left Eye Distance:   Bilateral Distance:    Right Eye Near:   Left Eye Near:    Bilateral Near:     Physical Exam Constitutional:      General: She is not in acute distress.    Appearance: Normal appearance. She is not toxic-appearing or diaphoretic.  HENT:     Head: Normocephalic and atraumatic.  Eyes:     Extraocular Movements: Extraocular movements intact.     Conjunctiva/sclera: Conjunctivae normal.     Comments: Mild swelling and erythema  located to left upper eyelid.  There is purulent drainage coming from eye as well.  Scleral redness present as well.  Pulmonary:     Effort: Pulmonary effort is normal.  Neurological:     General: No focal deficit present.     Mental Status: She is alert and oriented to person, place, and time. Mental status is at baseline.  Psychiatric:        Mood and Affect: Mood normal.        Behavior: Behavior normal.        Thought Content: Thought content normal.        Judgment: Judgment normal.      UC Treatments / Results  Labs (all labs ordered are listed, but only abnormal results are displayed) Labs Reviewed - No data to display  EKG   Radiology No results found.  Procedures Procedures (including critical care time)  Medications Ordered in UC Medications - No data to display  Initial Impression / Assessment and Plan / UC Course  I have reviewed the triage vital signs and the nursing notes.  Pertinent labs & imaging results that were available during my care of the patient were reviewed by me and considered in my medical decision making (see chart for details).     Differential diagnoses include preseptal cellulitis versus stye versus orbital cellulitis.  Patient is complaining of significant pain on palpation and physical exam as well as with extraocular movements.  Therefore, there is concern for orbital cellulitis which needs to be ruled out.  Do not have resources here in urgent care to rule out or treat orbital cellulitis.  Therefore, patient was advised to go to the ER for further evaluation and management and was agreeable with plan.  Vital signs stable at discharge.  Agree with patient self transport to the hospital. Final Clinical Impressions(s) / UC Diagnoses   Final diagnoses:  Left eye pain  Swelling of left eyelid     Discharge Instructions      Please go to the  emergency department as soon as you leave urgent care for further evaluation and management.      ED Prescriptions   None    PDMP not reviewed this encounter.   Teodora Medici, [place] 10/17/21 1335

## 2021-10-17 NOTE — ED Triage Notes (Signed)
Patient c/o left eyelid swelling and pain x 1 day.  No apparent injury, no contacts, patient does wear reading glasses.  Patient has taken Tylenol for pain.

## 2021-10-17 NOTE — Discharge Instructions (Addendum)
[facility] to be evaluated for your left eyelid pain and swelling.  There is concern for hordeolum versus a infection of your myopia and gland.  Due to this we have given you a course of Augmentin.  Please take this medication as prescribed.  I am also giving you erythromycin ointment to treat for a possible bacterial conjunctivitis.  Please apply warm compresses as we discussed.  If your symptoms do not improve please follow-up with your ophthalmologist or the ophthalmologist listed on this paperwork.  Get help right away if: You have a fever and your symptoms suddenly get worse. You have severe pain when you move your eye. You have facial pain, redness, or swelling. You have a sudden loss of vision.

## 2021-10-17 NOTE — ED Triage Notes (Signed)
Patient c/o eye redness and swelling x 2 days. Patient states she is having slight blurred vision.

## 2021-10-17 NOTE — ED Notes (Signed)
Patient is being discharged from the Urgent Care and sent to the Emergency Department via private vehicle . Per [place] patient is in need of higher level of care due to further evaluation. Patient is aware and verbalizes understanding of plan of care.  Vitals:   10/17/21 1324  BP: 125/71  Pulse: 88  Resp: 18  Temp: 98.2 F (36.8 C)  SpO2: 96%

## 2021-10-17 NOTE — ED Provider Notes (Addendum)
[facility] Provider Note   CSN: [id number] Arrival date & time: 10/17/21  1514     History  No chief complaint on file.   Mikayla Washington is a 76 y.o. female with medical history of mitral valve prolapse, degenerative joint disease, osteopenia, status post left cataract extraction.  Presents emergency department with a chief complaint of swelling and pain to left eyelid.  Patient reports that this started 2 days ago and has been getting progressively worse.  Patient has noticed minimal amount of watery discharge coming from her.  Patient has been trying warm compress with no relief of symptoms.  Patient does report some blurry vision to the affected eye however it is not significantly worse than normal without her glasses.  Patient denies any eye injuries.  Patient does not wear any contacts.  Denies any fever, chills, diplopia, vision loss, facial swelling, neck pain, neck stiffness.  HPI     Home Medications Prior to Admission medications   Medication Sig Start Date End Date Taking? Authorizing Provider  Multiple Vitamins-Minerals (CENTRUM SILVER PO) Take 1 tablet by mouth daily.    [provider]  Vitamin D, Ergocalciferol, (DRISDOL) 1.25 MG (50000 UNIT) CAPS capsule Take one tablet wkly 12/09/19   Lorrene Reid, PA-C      Allergies    Patient has no known allergies.    Review of Systems   Review of Systems  Constitutional:  Negative for chills and fever.  HENT:  Negative for facial swelling.   Eyes:  Positive for pain, discharge and redness. Negative for photophobia, itching and visual disturbance.  Genitourinary:  Negative for difficulty urinating and dysuria.  Musculoskeletal:  Negative for back pain and neck pain.  Skin:  Negative for color change, pallor, rash and wound.  Neurological:  Negative for dizziness, syncope, facial asymmetry, light-headedness and headaches.  Psychiatric/Behavioral:  Negative for confusion.      Physical Exam Updated Vital Signs Ht '5\' 4"'$  (1.626 m)   Wt 59 kg   BMI 22.31 kg/m  Physical Exam Vitals and nursing note reviewed.  Constitutional:      General: She is not in acute distress.    Appearance: She is not ill-appearing, toxic-appearing or diaphoretic.  HENT:     Head: Normocephalic. No right periorbital erythema or left periorbital erythema.     Comments: No periorbital erythema or swelling bilaterally.  Patient has no tenderness to periorbital region bilaterally. Eyes:     General: Lids are everted, no foreign bodies appreciated. Vision grossly intact. No scleral icterus.       Right eye: No foreign body, discharge or hordeolum.        Left eye: Discharge and hordeolum present.No foreign body.     Extraocular Movements: Extraocular movements intact.     Conjunctiva/sclera:     Right eye: Right conjunctiva is not injected. No chemosis, exudate or hemorrhage.    Left eye: Left conjunctiva is injected. No chemosis, exudate or hemorrhage.    Pupils: Pupils are equal, round, and reactive to light.     Comments: Minimal watery discharge noted to bilateral canthi of left eye.  Cardiovascular:     Rate and Rhythm: Normal rate.     Heart sounds: Normal heart sounds.  Pulmonary:     Effort: Pulmonary effort is normal.     Breath sounds: Normal breath sounds.  Abdominal:     Palpations: Abdomen is soft.     Tenderness: There is no abdominal tenderness.  Musculoskeletal:  Cervical back: Neck supple.  Skin:    General: Skin is warm and dry.  Neurological:     General: No focal deficit present.     Mental Status: She is alert and oriented to person, place, and time.     GCS: GCS eye subscore is 4. GCS verbal subscore is 5. GCS motor subscore is 6.  Psychiatric:        Behavior: Behavior is cooperative.     ED Results / Procedures / Treatments   Labs (all labs ordered are listed, but only abnormal results are displayed) Labs Reviewed - No data to  display  EKG None  Radiology No results found.  Procedures Procedures    Medications Ordered in ED Medications - No data to display  ED Course/ Medical Decision Making/ A&P Clinical Course as of 10/17/21 1633  Sun Oct 17, 8988  3941 75 year old female complaining of left eye pain for 2 days.  No known trauma.  She has a swollen upper lip that is tender.  No clear pustule.  Will cover with some topical and oral antibiotics warm compresses.  No concern for orbital cellulitis. [MB]    Clinical Course User Index [MB] Hayden Rasmussen, MD                           Medical Decision Making Risk Prescription drug management.   Alert 75 year old female in no acute stress, nontoxic-appearing.  Presents emergency department with a chief complaint of left eyelid pain and swelling.  Information obtained from patient.  I reviewed patient's past medical records including previous provider notes, labs, and imaging.  Patient has medical history as outlined HPI was complicates her care.  Patient was seen at urgent care earlier today and sent to the emergency department due to concerns for possible preseptal or septal cellulitis.  On my exam patient has no periorbital swelling, erythema, or tenderness.  No pain with EOM.  Low suspicion for periorbital or orbital cellulitis at this time.  Visual acuity shows no difference obtain bilateral eyes.  Suspect that patient's symptom is due to a hordeolum versus myopia and gland infection.  Also concern for possible bacterial conjunctivitis.  Will provide patient with erythromycin ointment as well as 7-day course of Augmentin.  Patient given information to follow-up with ophthalmologist if symptoms do not improve.  Strict return precautions were stressed to the patient.  Patient was discussed with and evaluated by Dr. Melina Copa.  Based on patient's chief complaint, I considered admission might be necessary, however after reassuring ED workup feel patient  is reasonable for discharge.  Discussed results, findings, treatment and follow up. Patient advised of return precautions. Patient verbalized understanding and agreed with plan.  Portions of this note were generated with [employer]. Dictation errors may occur despite best attempts at proofreading.         Final Clinical Impression(s) / ED Diagnoses Final diagnoses:  Hordeolum of left upper eyelid, unspecified hordeolum type  Acute bacterial conjunctivitis of left eye    Rx / DC Orders ED Discharge Orders          Ordered    amoxicillin-clavulanate (AUGMENTIN) 875-125 MG tablet  Every 12 hours        10/17/21 1641    erythromycin ophthalmic ointment        10/17/21 1641              Loni Beckwith, [place] 10/17/21 1639  Loni Beckwith, PA-C 10/17/21 1643    Hayden Rasmussen, MD 10/18/21 [phone]

## 2021-10-22 DIAGNOSIS — H00024 Hordeolum internum left upper eyelid: Secondary | ICD-10-CM | POA: Diagnosis not present

## 2021-12-03 DIAGNOSIS — H00024 Hordeolum internum left upper eyelid: Secondary | ICD-10-CM | POA: Diagnosis not present

## 2021-12-03 DIAGNOSIS — L03213 Periorbital cellulitis: Secondary | ICD-10-CM | POA: Diagnosis not present

## 2021-12-03 DIAGNOSIS — H04123 Dry eye syndrome of bilateral lacrimal glands: Secondary | ICD-10-CM | POA: Diagnosis not present

## 2021-12-03 DIAGNOSIS — H2511 Age-related nuclear cataract, right eye: Secondary | ICD-10-CM | POA: Diagnosis not present

## 2021-12-08 ENCOUNTER — Telehealth: Payer: Self-pay | Admitting: Internal Medicine

## 2021-12-08 NOTE — Telephone Encounter (Signed)
LVM for pt ot rtn my call to schedule AWV with [facility] call back # [phone] 

## 2021-12-27 ENCOUNTER — Ambulatory Visit (INDEPENDENT_AMBULATORY_CARE_PROVIDER_SITE_OTHER): Payer: Medicare PPO

## 2021-12-27 VITALS — BP 110/60 | HR 86 | Temp 97.8°F | Ht 64.0 in | Wt 129.0 lb

## 2021-12-27 DIAGNOSIS — Z0000 Encounter for general adult medical examination without abnormal findings: Secondary | ICD-10-CM | POA: Diagnosis not present

## 2021-12-27 NOTE — Patient Instructions (Signed)
Mikayla Washington , Thank you for taking time to come for your Medicare Wellness Visit. I appreciate your ongoing commitment to your health goals. Please review the following plan we discussed and let me know if I can assist you in the future.   Screening recommendations/referrals: Colonoscopy: No longer recommended due to age Mammogram: 02/18/2021; due every year Bone Density: 06/20/2019; due every 2 years Recommended yearly ophthalmology/optometry visit for glaucoma screening and checkup Recommended yearly dental visit for hygiene and checkup  Vaccinations: Influenza vaccine: 12/24/2020 Pneumococcal vaccine: 02/28/2011, 02/05/2019 Tdap vaccine: 03/24/2016; due every 10 years Shingles vaccine: 1 /17/2019, 3/29/20196   Covid-19: 05/24/2019, 06/14/2019  Advanced directives: Yes  Conditions/risks identified: Yes  Next appointment: Please schedule your next Medicare Wellness Visit with your Nurse Health Advisor in 1 year by calling [phone].   Preventive Care 76 Years and Older, Female Preventive care refers to lifestyle choices and visits with your health care provider that can promote health and wellness. What does preventive care include? A yearly physical exam. This is also called an annual well check. Dental exams once or twice a year. Routine eye exams. Ask your health care provider how often you should have your eyes checked. Personal lifestyle choices, including: Daily care of your teeth and gums. Regular physical activity. Eating a healthy diet. Avoiding tobacco and drug use. Limiting alcohol use. Practicing safe sex. Taking low-dose aspirin every day. Taking vitamin and mineral supplements as recommended by your health care provider. What happens during an annual well check? The services and screenings done by your health care provider during your annual well check will depend on your age, overall health, lifestyle risk factors, and family history of disease. Counseling  Your  health care provider may ask you questions about your: Alcohol use. Tobacco use. Drug use. Emotional well-being. Home and relationship well-being. Sexual activity. Eating habits. History of falls. Memory and ability to understand (cognition). Work and work Statistician. Reproductive health. Screening  You may have the following tests or measurements: Height, weight, and BMI. Blood pressure. Lipid and cholesterol levels. These may be checked every 5 years, or more frequently if you are over 17 years old. Skin check. Lung cancer screening. You may have this screening every year starting at age 30 if you have a 30-pack-year history of smoking and currently smoke or have quit within the past 15 years. Fecal occult blood test (FOBT) of the stool. You may have this test every year starting at age 41. Flexible sigmoidoscopy or colonoscopy. You may have a sigmoidoscopy every 5 years or a colonoscopy every 10 years starting at age 2. Hepatitis C blood test. Hepatitis B blood test. Sexually transmitted disease (STD) testing. Diabetes screening. This is done by checking your blood sugar (glucose) after you have not eaten for a while (fasting). You may have this done every 1-3 years. Bone density scan. This is done to screen for osteoporosis. You may have this done starting at age 35. Mammogram. This may be done every 1-2 years. Talk to your health care provider about how often you should have regular mammograms. Talk with your health care provider about your test results, treatment options, and if necessary, the need for more tests. Vaccines  Your health care provider may recommend certain vaccines, such as: Influenza vaccine. This is recommended every year. Tetanus, diphtheria, and acellular pertussis (Tdap, Td) vaccine. You may need a Td booster every 10 years. Zoster vaccine. You may need this after age 73. Pneumococcal 13-valent conjugate (PCV13) vaccine. One dose  is recommended after age  65. Pneumococcal polysaccharide (PPSV23) vaccine. One dose is recommended after age 7. Talk to your health care provider about which screenings and vaccines you need and how often you need them. This information is not intended to replace advice given to you by your health care provider. Make sure you discuss any questions you have with your health care provider. Document Released: 05/01/2015 Document Revised: 12/23/2015 Document Reviewed: 02/03/2015 Elsevier Interactive Patient Education  2017 [place] Prevention in the Home Falls can cause injuries. They can happen to people of all ages. There are many things you can do to make your home safe and to help prevent falls. What can I do on the outside of my home? Regularly fix the edges of walkways and driveways and fix any cracks. Remove anything that might make you trip as you walk through a door, such as a raised step or threshold. Trim any bushes or trees on the path to your home. Use bright outdoor lighting. Clear any walking paths of anything that might make someone trip, such as rocks or tools. Regularly check to see if handrails are loose or broken. Make sure that both sides of any steps have handrails. Any raised decks and porches should have guardrails on the edges. Have any leaves, snow, or ice cleared regularly. Use sand or salt on walking paths during winter. Clean up any spills in your garage right away. This includes oil or grease spills. What can I do in the bathroom? Use night lights. Install grab bars by the toilet and in the tub and shower. Do not use towel bars as grab bars. Use non-skid mats or decals in the tub or shower. If you need to sit down in the shower, use a plastic, non-slip stool. Keep the floor dry. Clean up any water that spills on the floor as soon as it happens. Remove soap buildup in the tub or shower regularly. Attach bath mats securely with double-sided non-slip rug tape. Do not have throw  rugs and other things on the floor that can make you trip. What can I do in the bedroom? Use night lights. Make sure that you have a light by your bed that is easy to reach. Do not use any sheets or blankets that are too big for your bed. They should not hang down onto the floor. Have a firm chair that has side arms. You can use this for support while you get dressed. Do not have throw rugs and other things on the floor that can make you trip. What can I do in the kitchen? Clean up any spills right away. Avoid walking on wet floors. Keep items that you use a lot in easy-to-reach places. If you need to reach something above you, use a strong step stool that has a grab bar. Keep electrical cords out of the way. Do not use floor polish or wax that makes floors slippery. If you must use wax, use non-skid floor wax. Do not have throw rugs and other things on the floor that can make you trip. What can I do with my stairs? Do not leave any items on the stairs. Make sure that there are handrails on both sides of the stairs and use them. Fix handrails that are broken or loose. Make sure that handrails are as long as the stairways. Check any carpeting to make sure that it is firmly attached to the stairs. Fix any carpet that is loose or worn. Avoid  having throw rugs at the top or bottom of the stairs. If you do have throw rugs, attach them to the floor with carpet tape. Make sure that you have a light switch at the top of the stairs and the bottom of the stairs. If you do not have them, ask someone to add them for you. What else can I do to help prevent falls? Wear shoes that: Do not have high heels. Have rubber bottoms. Are comfortable and fit you well. Are closed at the toe. Do not wear sandals. If you use a stepladder: Make sure that it is fully opened. Do not climb a closed stepladder. Make sure that both sides of the stepladder are locked into place. Ask someone to hold it for you, if  possible. Clearly mark and make sure that you can see: Any grab bars or handrails. First and last steps. Where the edge of each step is. Use tools that help you move around (mobility aids) if they are needed. These include: Canes. Walkers. Scooters. Crutches. Turn on the lights when you go into a dark area. Replace any light bulbs as soon as they burn out. Set up your furniture so you have a clear path. Avoid moving your furniture around. If any of your floors are uneven, fix them. If there are any pets around you, be aware of where they are. Review your medicines with your doctor. Some medicines can make you feel dizzy. This can increase your chance of falling. Ask your doctor what other things that you can do to help prevent falls. This information is not intended to replace advice given to you by your health care provider. Make sure you discuss any questions you have with your health care provider. Document Released: 01/29/2009 Document Revised: 09/10/2015 Document Reviewed: 05/09/2014 Elsevier Interactive Patient Education  2017 [employer].

## 2021-12-27 NOTE — Progress Notes (Signed)
Subjective:   Mikayla Washington is a 76 y.o. female who presents for Medicare Annual (Subsequent) preventive examination.  Review of Systems     Cardiac Risk Factors include: advanced age (>74mn, >>53women)     Objective:    Today's Vitals   12/27/21 1428  BP: 110/60  Pulse: 86  Temp: 97.8 F (36.6 C)  SpO2: 96%  Weight: 129 lb (58.5 kg)  Height: '5\' 4"'$  (1.626 m)  PainSc: 0-No pain   Body mass index is 22.14 kg/m.     12/27/2021    2:49 PM 10/17/2021    4:34 PM  Advanced Directives  Does Patient Have a Medical Advance Directive? Yes Yes  Type of AParamedicof AHollisLiving will HSarah AnnLiving will  Copy of HLos Ybanezin Chart? No - copy requested   Would patient like information on creating a medical advance directive?  No - Patient declined    Current Medications (verified) Outpatient Encounter Medications as of 12/27/2021  Medication Sig   Multiple Vitamins-Minerals (CENTRUM SILVER PO) Take 1 tablet by mouth daily.   Vitamin D, Ergocalciferol, (DRISDOL) 1.25 MG (50000 UNIT) CAPS capsule Take one tablet wkly   amoxicillin-clavulanate (AUGMENTIN) 875-125 MG tablet Take 1 tablet by mouth every 12 (twelve) hours. (Patient not taking: Reported on 12/27/2021)   erythromycin ophthalmic ointment Place a 1/2 inch ribbon of ointment into the lower eyelid.  4 times daily for the next 7 days (Patient not taking: Reported on 12/27/2021)   No facility-administered encounter medications on file as of 12/27/2021.    Allergies (verified) Patient has no known allergies.   History: Past Medical History:  Diagnosis Date   Adenomatous colon polyp 04/19/2011   Dr PSharlett Iles  Breast disease    fibrocystic   DJD (degenerative joint disease)    Mitral valve prolapse    Osteopenia    Past Surgical History:  Procedure Laterality Date   ABDOMINAL HYSTERECTOMY     ANTERIOR AND POSTERIOR VAGINAL REPAIR     BREAST LUMPECTOMY      X 3   CATARACT EXTRACTION Left    COLONOSCOPY  2002   Dr PSharlett Iles negative   colonoscopy with polypectomy  2013   adenomatous polyp   RECTOCELE REPAIR     TONSILLECTOMY     TOTAL ABDOMINAL HYSTERECTOMY W/ BILATERAL SALPINGOOPHORECTOMY  2001    benign ovarian tumor (BSO also)   Family History  Adopted: Yes   Social History   Socioeconomic History   Marital status: Married    Spouse name: Not on file   Number of children: Not on file   Years of education: Not on file   Highest education level: Not on file  Occupational History   Occupation: Retired TPharmacist, hospital Tobacco Use   Smoking status: Never   Smokeless tobacco: Never  Vaping Use   Vaping Use: Never used  Substance and Sexual Activity   Alcohol use: No    Comment: rare, 1 x per year or at celebrations    Drug use: No   Sexual activity: Yes    Birth control/protection: Surgical  Other Topics Concern   Not on file  Social History Narrative   Not on file   Social Determinants of Health   Financial Resource Strain: Low Risk  (12/27/2021)   Overall Financial Resource Strain (CARDIA)    Difficulty of Paying Living Expenses: Not hard at all  Food Insecurity: No Food Insecurity (12/27/2021)   Hunger  Vital Sign    Worried About Charity fundraiser in the Last Year: Never true    Ran Out of Food in the Last Year: Never true  Transportation Needs: No Transportation Needs (12/27/2021)   PRAPARE - Hydrologist (Medical): No    Lack of Transportation (Non-Medical): No  Physical Activity: Sufficiently Active (12/27/2021)   Exercise Vital Sign    Days of Exercise per Week: 5 days    Minutes of Exercise per Session: 30 min  Stress: No Stress Concern Present (12/27/2021)   [place]    Feeling of Stress : Not at all  Social Connections: [place] (12/27/2021)   Social Connection and Isolation Panel [NHANES]    Frequency  of Communication with Friends and Family: More than three times a week    Frequency of Social Gatherings with Friends and Family: More than three times a week    Attends Religious Services: More than 4 times per year    Active Member of [employer] or Organizations: Yes    Attends Music therapist: More than 4 times per year    Marital Status: Married    Tobacco Counseling Counseling given: Not Answered   Clinical Intake:  Pre-visit preparation completed: Yes  Pain : No/denies pain Pain Score: 0-No pain     BMI - recorded: 22.14 Nutritional Status: BMI of 19-24  Normal Nutritional Risks: None Diabetes: No  How often do you need to have someone help you when you read instructions, pamphlets, or other written materials from your doctor or pharmacy?: 1 - Never  Diabetic? no  Interpreter Needed?: No      Activities of Daily Living    12/27/2021    4:21 PM  In your present state of health, do you have any difficulty performing the following activities:  Hearing? 0  Vision? 0  Difficulty concentrating or making decisions? 0  Walking or climbing stairs? 0  Dressing or bathing? 0  Doing errands, shopping? 0  Preparing Food and eating ? N  Using the Toilet? N  In the past six months, have you accidently leaked urine? N  Do you have problems with loss of bowel control? N  Managing your Medications? N  Managing your Finances? N  Housekeeping or managing your Housekeeping? N    Patient Care Team: Hoyt Koch, MD as PCP - General (Internal Medicine) Calvert Cantor, MD as Consulting Physician (Ophthalmology) Sable Feil, MD as Consulting Physician (Gastroenterology) Jerline Pain Mingo Amber, DO as Consulting Physician (Ophthalmology)  Indicate any recent Medical Services you may have received from other than [facility] providers in the past year (date may be approximate).     Assessment:   This is a routine wellness examination for Mikayla Washington.  Hearing/Vision  screen Hearing Screening - Comments:: Denies hearing difficulties   Vision Screening - Comments:: Wears rx glasses - up to date with routine eye exams with Daryel Gerald, MD.   Dietary issues and exercise activities discussed: Current Exercise Habits: Home exercise routine, Type of exercise: walking, Time (Minutes): 30, Frequency (Times/Week): 5, Weekly Exercise (Minutes/Week): 150, Intensity: Moderate, Exercise limited by: orthopedic condition(s)   Goals Addressed             This Visit's Progress    To maintain my current health status by continuing to eat healthy, stay physically active and socially active.        Depression Screen    12/27/2021  4:20 PM 12/24/2020   10:42 AM 07/02/2020    9:36 AM 07/22/2019    3:05 PM 04/04/2019    1:25 PM 03/29/2019    9:13 AM 09/27/2018    8:38 AM  PHQ 2/9 Scores  PHQ - 2 Score 0 0 0 0 0 0 0  PHQ- 9 Score  0 0 0 0 1 0    Fall Risk    12/27/2021    2:54 PM 12/24/2020   10:41 AM 07/02/2020    9:34 AM 04/04/2019    1:24 PM 03/28/2018    8:23 AM  Fall Risk   Falls in the past year? 0 0 1 0 0  Number falls in past yr: 0 0  0   Injury with Fall? 0 0  0   Risk for fall due to : No Fall Risks No Fall Risks     Follow up Falls prevention discussed Falls evaluation completed Falls evaluation completed Falls evaluation completed     [place]:  Any stairs in or around the home? Yes  If so, are there any without handrails? Yes  Home free of loose throw rugs in walkways, pet beds, electrical cords, etc? No  Adequate lighting in your home to reduce risk of falls? No   ASSISTIVE DEVICES UTILIZED TO PREVENT FALLS:  Life alert? No  Use of a cane, walker or w/c? No  Grab bars in the bathroom? Yes  Shower chair or bench in shower? Yes  Elevated toilet seat or a handicapped toilet? Yes   TIMED UP AND GO:  Was the test performed? Yes .  Length of time to ambulate 10 feet: 6 sec.   Gait steady and fast  without use of assistive device  Cognitive Function:        12/27/2021    4:22 PM 12/24/2020   10:26 AM 04/04/2019    1:22 PM 03/28/2018    8:21 AM 03/24/2016    6:14 PM  6CIT Screen  What Year? 0 points 0 points 0 points 0 points 0 points  What month? 0 points 0 points 0 points 0 points 0 points  What time? 0 points 0 points 0 points 0 points 0 points  Count back from 20 0 points 0 points 0 points 0 points 0 points  Months in reverse 0 points 0 points 0 points 0 points 0 points  Repeat phrase 0 points 0 points 0 points 0 points 0 points  Total Score 0 points 0 points 0 points 0 points 0 points    Immunizations Immunization History  Administered Date(s) Administered   Fluad Quad(high Dose 65+) 12/24/2020   Influenza, High Dose Seasonal PF 12/23/2017, 12/08/2018   Influenza,inj,Quad PF,6+ Mos 02/18/2014   Influenza-Unspecified 01/08/2016, 12/23/2017   PFIZER(Purple Top)SARS-COV-2 Vaccination 05/24/2019, 06/14/2019   Pneumococcal Conjugate-13 11/28/2014, 02/05/2019   Pneumococcal Polysaccharide-23 04/09/2007, 02/28/2011   Td 11/16/2001   Tdap 03/24/2016   Zoster Recombinat (Shingrix) 05/04/2017, 07/14/2017   Zoster, Live 05/07/2007    TDAP status: Up to date  Flu Vaccine status: Due, Education has been provided regarding the importance of this vaccine. Advised may receive this vaccine at local pharmacy or Health Dept. Aware to provide a copy of the vaccination record if obtained from local pharmacy or Health Dept. Verbalized acceptance and understanding.  Pneumococcal vaccine status: Up to date  Covid-19 vaccine status: Completed vaccines  Qualifies for Shingles Vaccine? Yes   Zostavax completed Yes   Shingrix Completed?: Yes  Screening Tests Health Maintenance  Topic Date Due   COVID-19 Vaccine (3 - Pfizer risk series) 07/12/2019   INFLUENZA VACCINE  11/16/2021   TETANUS/TDAP  03/24/2026   Pneumonia Vaccine 55+ Years old  Completed   DEXA SCAN  Completed    Hepatitis C Screening  Completed   Zoster Vaccines- Shingrix  Completed   HPV VACCINES  Aged Out   COLONOSCOPY (Pts 45-56yr Insurance coverage will need to be confirmed)  DLindMaintenance Due  Topic Date Due   COVID-19 Vaccine (3 - Pfizer risk series) 07/12/2019   INFLUENZA VACCINE  11/16/2021    Colorectal cancer screening: No longer required.   Mammogram status: Completed 02/18/2021. Repeat every year  Bone Density status: Completed 06/20/2019. Results reflect: Bone density results: OSTEOPOROSIS. Repeat every 2 years.  Lung Cancer Screening: (Low Dose CT Chest recommended if Age 76-80years, 30 pack-year currently smoking OR have quit w/in 15years.) does not qualify.   Lung Cancer Screening Referral: no  Additional Screening:  Hepatitis C Screening: does qualify; Completed 03/24/2016  Vision Screening: Recommended annual ophthalmology exams for early detection of glaucoma and other disorders of the eye. Is the patient up to date with their annual eye exam?  Yes  Who is the provider or what is the name of the office in which the patient attends annual eye exams? JDaryel Gerald MD. If pt is not established with a provider, would they like to be referred to a provider to establish care? No .   Dental Screening: Recommended annual dental exams for proper oral hygiene  Community Resource Referral / Chronic Care Management: CRR required this visit?  No   CCM required this visit?  No      Plan:     I have personally reviewed and noted the following in the patient's chart:   Medical and social history Use of alcohol, tobacco or illicit drugs  Current medications and supplements including opioid prescriptions. Patient is not currently taking opioid prescriptions. Functional ability and status Nutritional status Physical activity Advanced directives List of other physicians Hospitalizations, surgeries, and ER visits in previous 12  months Vitals Screenings to include cognitive, depression, and falls Referrals and appointments  In addition, I have reviewed and discussed with patient certain preventive protocols, quality metrics, and best practice recommendations. A written personalized care plan for preventive services as well as general preventive health recommendations were provided to patient.     SSheral Flow LPN   [id number]  Nurse Notes:  Normal cognitive status assessed by direct observation by this Nurse Health Advisor. No abnormalities found.

## 2022-02-21 ENCOUNTER — Encounter: Payer: Medicare PPO | Admitting: Internal Medicine

## 2022-02-22 ENCOUNTER — Encounter: Payer: Medicare PPO | Admitting: Internal Medicine

## 2022-02-23 ENCOUNTER — Encounter: Payer: Self-pay | Admitting: Internal Medicine

## 2022-02-23 ENCOUNTER — Ambulatory Visit (INDEPENDENT_AMBULATORY_CARE_PROVIDER_SITE_OTHER): Payer: Medicare PPO | Admitting: Internal Medicine

## 2022-02-23 VITALS — BP 114/80 | HR 76 | Temp 97.7°F | Ht 64.0 in | Wt 128.1 lb

## 2022-02-23 DIAGNOSIS — Z0000 Encounter for general adult medical examination without abnormal findings: Secondary | ICD-10-CM | POA: Diagnosis not present

## 2022-02-23 DIAGNOSIS — M810 Age-related osteoporosis without current pathological fracture: Secondary | ICD-10-CM

## 2022-02-23 DIAGNOSIS — Z23 Encounter for immunization: Secondary | ICD-10-CM | POA: Diagnosis not present

## 2022-02-23 DIAGNOSIS — K581 Irritable bowel syndrome with constipation: Secondary | ICD-10-CM

## 2022-02-23 LAB — COMPREHENSIVE METABOLIC PANEL
ALT: 12 U/L (ref 0–35)
AST: 14 U/L (ref 0–37)
Albumin: 4.2 g/dL (ref 3.5–5.2)
Alkaline Phosphatase: 83 U/L (ref 39–117)
BUN: 17 mg/dL (ref 6–23)
CO2: 29 mEq/L (ref 19–32)
Calcium: 9.2 mg/dL (ref 8.4–10.5)
Chloride: 105 mEq/L (ref 96–112)
Creatinine, Ser: 0.71 mg/dL (ref 0.40–1.20)
GFR: 82.73 mL/min (ref >60.00)
Glucose, Bld: 96 mg/dL (ref 70–99)
Potassium: 4.2 mEq/L (ref 3.5–5.1)
Sodium: 140 mEq/L (ref 135–145)
Total Bilirubin: 0.4 mg/dL (ref 0.2–1.2)
Total Protein: 6.7 g/dL (ref 6.0–8.3)

## 2022-02-23 LAB — LIPID PANEL
Cholesterol: 175 mg/dL (ref 0–200)
HDL: 60.9 mg/dL (ref >39.00)
LDL Cholesterol: 97 mg/dL (ref 0–99)
NonHDL: 114.39
Total CHOL/HDL Ratio: 3
Triglycerides: 88 mg/dL (ref 0.0–149.0)
VLDL: 17.6 mg/dL (ref 0.0–40.0)

## 2022-02-23 LAB — CBC
HCT: 39.8 % (ref 36.0–46.0)
Hemoglobin: 12.9 g/dL (ref 12.0–15.0)
MCHC: 32.5 g/dL (ref 30.0–36.0)
MCV: 90.5 fl (ref 78.0–100.0)
Platelets: 216 10*3/uL (ref 150.0–400.0)
RBC: 4.41 Mil/uL (ref 3.87–5.11)
RDW: 14.3 % (ref 11.5–15.5)
WBC: 6.1 10*3/uL (ref 4.0–10.5)

## 2022-02-23 NOTE — Patient Instructions (Signed)
You can get the RSV vaccine if you want to get it.

## 2022-02-23 NOTE — Progress Notes (Signed)
   Subjective:   Patient ID: Mikayla Washington, female    DOB: 1946-01-06, 76 y.o.   MRN: [id number]  HPI The patient is here for physical.  PMH, [facility], social history reviewed and updated  Review of Systems  Constitutional: Negative.   HENT: Negative.    Eyes: Negative.   Respiratory:  Negative for cough, chest tightness and shortness of breath.   Cardiovascular:  Negative for chest pain, palpitations and leg swelling.  Gastrointestinal:  Negative for abdominal distention, abdominal pain, constipation, diarrhea, nausea and vomiting.  Musculoskeletal: Negative.   Skin: Negative.   Neurological: Negative.   Psychiatric/Behavioral: Negative.      Objective:  Physical Exam Constitutional:      Appearance: She is well-developed.  HENT:     Head: Normocephalic and atraumatic.  Cardiovascular:     Rate and Rhythm: Normal rate and regular rhythm.  Pulmonary:     Effort: Pulmonary effort is normal. No respiratory distress.     Breath sounds: Normal breath sounds. No wheezing or rales.  Abdominal:     General: Bowel sounds are normal. There is no distension.     Palpations: Abdomen is soft.     Tenderness: There is no abdominal tenderness. There is no rebound.  Musculoskeletal:     Cervical back: Normal range of motion.  Skin:    General: Skin is warm and dry.  Neurological:     Mental Status: She is alert and oriented to person, place, and time.     Coordination: Coordination normal.     Vitals:   02/23/22 0838  BP: 114/80  Pulse: 76  Temp: 97.7 F (36.5 C)  TempSrc: Oral  SpO2: 95%  Weight: 128 lb 2 oz (58.1 kg)  Height: '5\' 4"'$  (1.626 m)    Assessment & Plan:  Flu shot given at visit

## 2022-02-24 DIAGNOSIS — Z1231 Encounter for screening mammogram for malignant neoplasm of breast: Secondary | ICD-10-CM | POA: Diagnosis not present

## 2022-02-24 LAB — HM MAMMOGRAPHY

## 2022-02-25 NOTE — Assessment & Plan Note (Signed)
Checking CMP and CBC. Overall stable and diet controlled.

## 2022-02-25 NOTE — Assessment & Plan Note (Signed)
Flu shot given. Covid-19 counseled. RSV counseled. Pneumonia complete. Shingrix complete. Tetanus up to date. Colonoscopy up to date. Mammogram upto date, pap smear aged out and dexa due 2024. Counseled about sun safety and mole surveillance. Counseled about the dangers of distracted driving. Given 10 year screening recommendations.

## 2022-02-25 NOTE — Assessment & Plan Note (Signed)
Checking CBC and CMP. DEXA due in 2024

## 2022-10-07 DIAGNOSIS — M810 Age-related osteoporosis without current pathological fracture: Secondary | ICD-10-CM | POA: Diagnosis not present

## 2022-10-07 DIAGNOSIS — R32 Unspecified urinary incontinence: Secondary | ICD-10-CM | POA: Diagnosis not present

## 2022-10-07 DIAGNOSIS — K5900 Constipation, unspecified: Secondary | ICD-10-CM | POA: Diagnosis not present

## 2022-12-13 DIAGNOSIS — H353131 Nonexudative age-related macular degeneration, bilateral, early dry stage: Secondary | ICD-10-CM | POA: Diagnosis not present

## 2022-12-13 DIAGNOSIS — H2511 Age-related nuclear cataract, right eye: Secondary | ICD-10-CM | POA: Diagnosis not present

## 2022-12-13 DIAGNOSIS — H5211 Myopia, right eye: Secondary | ICD-10-CM | POA: Diagnosis not present

## 2022-12-13 DIAGNOSIS — H04123 Dry eye syndrome of bilateral lacrimal glands: Secondary | ICD-10-CM | POA: Diagnosis not present

## 2022-12-13 DIAGNOSIS — H0102B Squamous blepharitis left eye, upper and lower eyelids: Secondary | ICD-10-CM | POA: Diagnosis not present

## 2022-12-13 DIAGNOSIS — H52223 Regular astigmatism, bilateral: Secondary | ICD-10-CM | POA: Diagnosis not present

## 2023-03-02 DIAGNOSIS — Z1231 Encounter for screening mammogram for malignant neoplasm of breast: Secondary | ICD-10-CM | POA: Diagnosis not present

## 2023-03-02 LAB — HM MAMMOGRAPHY

## 2023-03-03 ENCOUNTER — Ambulatory Visit (INDEPENDENT_AMBULATORY_CARE_PROVIDER_SITE_OTHER): Payer: Medicare PPO | Admitting: Internal Medicine

## 2023-03-03 ENCOUNTER — Encounter: Payer: Self-pay | Admitting: Internal Medicine

## 2023-03-03 VITALS — BP 116/82 | HR 82 | Temp 98.3°F | Ht 64.0 in | Wt 120.0 lb

## 2023-03-03 DIAGNOSIS — Z0000 Encounter for general adult medical examination without abnormal findings: Secondary | ICD-10-CM

## 2023-03-03 DIAGNOSIS — Z860101 Personal history of adenomatous and serrated colon polyps: Secondary | ICD-10-CM

## 2023-03-03 DIAGNOSIS — K581 Irritable bowel syndrome with constipation: Secondary | ICD-10-CM

## 2023-03-03 DIAGNOSIS — Z1283 Encounter for screening for malignant neoplasm of skin: Secondary | ICD-10-CM

## 2023-03-03 DIAGNOSIS — M810 Age-related osteoporosis without current pathological fracture: Secondary | ICD-10-CM | POA: Diagnosis not present

## 2023-03-03 LAB — COMPREHENSIVE METABOLIC PANEL
ALT: 8 U/L (ref 0–35)
AST: 12 U/L (ref 0–37)
Albumin: 4.2 g/dL (ref 3.5–5.2)
Alkaline Phosphatase: 85 U/L (ref 39–117)
BUN: 20 mg/dL (ref 6–23)
CO2: 29 meq/L (ref 19–32)
Calcium: 9.3 mg/dL (ref 8.4–10.5)
Chloride: 106 meq/L (ref 96–112)
Creatinine, Ser: 0.72 mg/dL (ref 0.40–1.20)
GFR: 80.77 mL/min (ref >60.00)
Glucose, Bld: 94 mg/dL (ref 70–99)
Potassium: 4.5 meq/L (ref 3.5–5.1)
Sodium: 141 meq/L (ref 135–145)
Total Bilirubin: 0.4 mg/dL (ref 0.2–1.2)
Total Protein: 6.7 g/dL (ref 6.0–8.3)

## 2023-03-03 LAB — CBC
HCT: 40.8 % (ref 36.0–46.0)
Hemoglobin: 13.2 g/dL (ref 12.0–15.0)
MCHC: 32.3 g/dL (ref 30.0–36.0)
MCV: 91.7 fL (ref 78.0–100.0)
Platelets: 235 10*3/uL (ref 150.0–400.0)
RBC: 4.45 Mil/uL (ref 3.87–5.11)
RDW: 14.6 % (ref 11.5–15.5)
WBC: 6.4 10*3/uL (ref 4.0–10.5)

## 2023-03-03 LAB — LIPID PANEL
Cholesterol: 168 mg/dL (ref 0–200)
HDL: 53 mg/dL (ref >39.00)
LDL Cholesterol: 94 mg/dL (ref 0–99)
NonHDL: 114.83
Total CHOL/HDL Ratio: 3
Triglycerides: 106 mg/dL (ref 0.0–149.0)
VLDL: 21.2 mg/dL (ref 0.0–40.0)

## 2023-03-03 NOTE — Assessment & Plan Note (Signed)
Flu shot up to date. Pneumonia complete. Shingrix compete. Tetanus due 2027. Colonoscopy aged out. Mammogram aged out, pap smear aged out and dexa complete. Counseled about sun safety and mole surveillance. Counseled about the dangers of distracted driving. Given 10 year screening recommendations.

## 2023-03-03 NOTE — Assessment & Plan Note (Signed)
Reviewed last decade all non pre-cancerous and does not need further screening.

## 2023-03-03 NOTE — Assessment & Plan Note (Signed)
Stable constipation managed through diet.

## 2023-03-03 NOTE — Progress Notes (Signed)
Subjective:   Patient ID: Mikayla Washington, female    DOB: [birth year], 77 y.o.   MRN: [id number]  HPI Here for medicare wellness and physical, no new complaints. Please see A/P for status and treatment of chronic medical problems.   Diet: heart healthy Physical activity: active Depression/mood screen: negative Hearing: intact to whispered voice Visual acuity: grossly normal, performs annual eye exam  ADLs: capable Fall risk: none Home safety: good Cognitive evaluation: intact to orientation, naming, recall and repetition EOL planning: adv directives discussed  Flowsheet Row Office Visit from 03/03/2023 in [facility] [place]  PHQ-2 Total Score 0       Flowsheet Row Office Visit from 03/03/2023 in [facility] [place]  PHQ-9 Total Score 0         10/17/2021    1:27 PM 10/17/2021    4:34 PM 12/27/2021    2:54 PM 02/23/2022    8:43 AM 03/03/2023    8:56 AM  Fall Risk  Falls in the past year?   0 0 0  Was there an injury with Fall?   0 0 0  Fall Risk Category Calculator   0 0 0  Fall Risk Category (Retired)   Low Low   (RETIRED) Patient Fall Risk Level Low fall risk Low fall risk Low fall risk Low fall risk   Patient at Risk for Falls Due to   No Fall Risks    Fall risk Follow up   Falls prevention discussed Falls evaluation completed Falls evaluation completed    I have personally reviewed and have noted 1. The patient's medical and social history - reviewed today no changes 2. Their use of alcohol, tobacco or illicit drugs 3. Their current medications and supplements 4. The patient's functional ability including ADL's, fall risks, home safety risks and hearing or visual impairment. 5. Diet and physical activities 6. Evidence for depression or mood disorders 7. Care team reviewed and updated 8.  The patient is not on an opioid pain medication   Patient Care Team: Myrlene Broker, MD as PCP - General (Internal  Medicine) Nelson Chimes, MD as Consulting Physician (Ophthalmology) Mardella Layman, MD as Consulting Physician (Gastroenterology) Jimmey Ralph Forest Becker, DO as Consulting Physician (Ophthalmology) Past Medical History:  Diagnosis Date   Adenomatous colon polyp 04/19/2011   Dr Jarold Motto   Breast disease    fibrocystic   DJD (degenerative joint disease)    Mitral valve prolapse    Osteopenia    Past Surgical History:  Procedure Laterality Date   ABDOMINAL HYSTERECTOMY     ANTERIOR AND POSTERIOR VAGINAL REPAIR     BREAST LUMPECTOMY     X 3   CATARACT EXTRACTION Left    COLONOSCOPY  2002   Dr Jarold Motto, negative   colonoscopy with polypectomy  2013   adenomatous polyp   RECTOCELE REPAIR     TONSILLECTOMY     TOTAL ABDOMINAL HYSTERECTOMY W/ BILATERAL SALPINGOOPHORECTOMY  2001    benign ovarian tumor (BSO also)   Family History  Adopted: Yes   Review of Systems  Constitutional: Negative.   HENT: Negative.    Eyes: Negative.   Respiratory:  Negative for cough, chest tightness and shortness of breath.   Cardiovascular:  Negative for chest pain, palpitations and leg swelling.  Gastrointestinal:  Negative for abdominal distention, abdominal pain, constipation, diarrhea, nausea and vomiting.  Musculoskeletal: Negative.   Skin: Negative.   Neurological: Negative.   Psychiatric/Behavioral: Negative.  Objective:  Physical Exam Constitutional:      Appearance: She is well-developed.  HENT:     Head: Normocephalic and atraumatic.  Cardiovascular:     Rate and Rhythm: Normal rate and regular rhythm.  Pulmonary:     Effort: Pulmonary effort is normal. No respiratory distress.     Breath sounds: Normal breath sounds. No wheezing or rales.  Abdominal:     General: Bowel sounds are normal. There is no distension.     Palpations: Abdomen is soft.     Tenderness: There is no abdominal tenderness. There is no rebound.  Musculoskeletal:     Cervical back: Normal range of motion.   Skin:    General: Skin is warm and dry.  Neurological:     Mental Status: She is alert and oriented to person, place, and time.     Coordination: Coordination normal.     Vitals:   03/03/23 0855  BP: 116/82  Pulse: 82  Temp: 98.3 F (36.8 C)  TempSrc: Oral  SpO2: 98%  Weight: 120 lb (54.4 kg)  Height: 5\' 4"  (1.626 m)    Assessment & Plan:

## 2023-03-03 NOTE — Assessment & Plan Note (Signed)
DEXA due this year or next and she elects next. Doing exercise and taking calcium and vitamin D.

## 2023-06-01 DIAGNOSIS — H2511 Age-related nuclear cataract, right eye: Secondary | ICD-10-CM | POA: Diagnosis not present

## 2023-06-01 DIAGNOSIS — H04123 Dry eye syndrome of bilateral lacrimal glands: Secondary | ICD-10-CM | POA: Diagnosis not present

## 2023-06-01 DIAGNOSIS — H353131 Nonexudative age-related macular degeneration, bilateral, early dry stage: Secondary | ICD-10-CM | POA: Diagnosis not present

## 2023-06-01 DIAGNOSIS — Z961 Presence of intraocular lens: Secondary | ICD-10-CM | POA: Diagnosis not present

## 2023-10-04 ENCOUNTER — Ambulatory Visit: Admitting: Dermatology

## 2023-10-04 ENCOUNTER — Encounter: Payer: Self-pay | Admitting: Dermatology

## 2023-10-04 VITALS — BP 161/72 | HR 85

## 2023-10-04 DIAGNOSIS — L814 Other melanin hyperpigmentation: Secondary | ICD-10-CM

## 2023-10-04 DIAGNOSIS — L821 Other seborrheic keratosis: Secondary | ICD-10-CM

## 2023-10-04 DIAGNOSIS — D229 Melanocytic nevi, unspecified: Secondary | ICD-10-CM

## 2023-10-04 DIAGNOSIS — Z1283 Encounter for screening for malignant neoplasm of skin: Secondary | ICD-10-CM

## 2023-10-04 DIAGNOSIS — W908XXA Exposure to other nonionizing radiation, initial encounter: Secondary | ICD-10-CM | POA: Diagnosis not present

## 2023-10-04 DIAGNOSIS — D492 Neoplasm of unspecified behavior of bone, soft tissue, and skin: Secondary | ICD-10-CM | POA: Diagnosis not present

## 2023-10-04 DIAGNOSIS — L578 Other skin changes due to chronic exposure to nonionizing radiation: Secondary | ICD-10-CM | POA: Diagnosis not present

## 2023-10-04 DIAGNOSIS — Z872 Personal history of diseases of the skin and subcutaneous tissue: Secondary | ICD-10-CM

## 2023-10-04 DIAGNOSIS — D1801 Hemangioma of skin and subcutaneous tissue: Secondary | ICD-10-CM

## 2023-10-04 DIAGNOSIS — L820 Inflamed seborrheic keratosis: Secondary | ICD-10-CM | POA: Diagnosis not present

## 2023-10-04 DIAGNOSIS — D485 Neoplasm of uncertain behavior of skin: Secondary | ICD-10-CM

## 2023-10-04 NOTE — Progress Notes (Signed)
 New Patient Visit   Subjective  Mikayla Washington is a 78 y.o. female who presents for the following: Skin Cancer Screening and Full Body Skin Exam. Hx of Aks. No family hx of skin cancer.  The patient presents for Upper Body Skin Exam (UBSE) for skin cancer screening and mole check. The patient has spots, moles and lesions to be evaluated, some may be new or changing.   The following portions of the chart were reviewed this encounter and updated as appropriate: medications, allergies, medical history  Review of Systems:  No other skin or systemic complaints except as noted in HPI or Assessment and Plan.  Objective  Well appearing patient in no apparent distress; mood and affect are within normal limits.  A full examination was performed including scalp, head, eyes, ears, nose, lips, neck, chest, axillae, abdomen, back, buttocks, bilateral upper extremities, bilateral lower extremities, hands, feet, fingers, toes, fingernails, and toenails. All findings within normal limits unless otherwise noted below.   Relevant physical exam findings are noted in the Assessment and Plan.  Left Breast Inflamed pink stuck on papule Mid Back 4mm brown papule   Assessment & Plan   SKIN CANCER SCREENING PERFORMED TODAY.  ACTINIC DAMAGE - Chronic condition, secondary to cumulative UV/sun exposure - diffuse scaly erythematous macules with underlying dyspigmentation - Recommend daily broad spectrum sunscreen SPF 30+ to sun-exposed areas, reapply every 2 hours as needed.  - Staying in the shade or wearing long sleeves, sun glasses (UVA+UVB protection) and wide brim hats (4-inch brim around the entire circumference of the hat) are also recommended for sun protection.  - Call for new or changing lesions.  LENTIGINES, SEBORRHEIC KERATOSES, HEMANGIOMAS - Benign normal skin lesions - Benign-appearing - Call for any changes  MELANOCYTIC NEVI - Tan-brown and/or pink-flesh-colored symmetric macules and  papules - Benign appearing on exam today - Observation - Call clinic for new or changing moles - Recommend daily use of broad spectrum spf 30+ sunscreen to sun-exposed areas.   INFLAMED SEBORRHEIC KERATOSIS Left Breast Destruction of lesion - Left Breast Complexity: simple   Destruction method: cryotherapy   Informed consent: discussed and consent obtained   Timeout:  patient name, date of birth, surgical site, and procedure verified Lesion destroyed using liquid nitrogen: Yes   Region frozen until ice ball extended beyond lesion: Yes   Cryotherapy cycles:  2 Outcome: patient tolerated procedure well with no complications   Post-procedure details: wound care instructions given   NEOPLASM OF UNCERTAIN BEHAVIOR OF SKIN Mid Back Skin / nail biopsy Type of biopsy: tangential   Informed consent: discussed and consent obtained   Timeout: patient name, date of birth, surgical site, and procedure verified   Procedure prep:  Patient was prepped and draped in usual sterile fashion Prep type:  Isopropyl alcohol Anesthesia: the lesion was anesthetized in a standard fashion   Anesthetic:  1% lidocaine  w/ epinephrine 1-100,000 buffered w/ 8.4% NaHCO3 Instrument used: DermaBlade   Hemostasis achieved with: aluminum chloride   Outcome: patient tolerated procedure well   Post-procedure details: sterile dressing applied and wound care instructions given   Dressing type: bandage and petrolatum   Specimen 1 - Surgical pathology Differential Diagnosis: r/o pigmented BCC vs sk vs other  Check Margins: No LENTIGINES   CHERRY ANGIOMA   MULTIPLE BENIGN NEVI   ACTINIC SKIN DAMAGE    Return in about 1 year (around 10/03/2024) for [facility].  I, Haig Levan, Surg Tech III, am acting as Neurosurgeon for [employer]  Archie Atilano, MD.   Documentation: I have reviewed the above documentation for accuracy and completeness, and I agree with the above.  Deneise Finlay, MD

## 2023-10-04 NOTE — Patient Instructions (Signed)
 Important Information  Due to recent changes in healthcare laws, you may see results of your pathology and/or laboratory studies on MyChart before the doctors have had a chance to review them. We understand that in some cases there may be results that are confusing or concerning to you. Please understand that not all results are received at the same time and often the doctors may need to interpret multiple results in order to provide you with the best plan of care or course of treatment. Therefore, we ask that you please give [place] 2 business days to thoroughly review all your results before contacting the office for clarification. Should we see a critical lab result, you will be contacted sooner.   If You Need Anything After Your Visit  If you have any questions or concerns for your doctor, please call our main line at [phone] If no one answers, please leave a voicemail as directed and we will return your call as soon as possible. Messages left after 4 pm will be answered the following business day.   You may also send [place] a message via MyChart. We typically respond to MyChart messages within 1-2 business days.  For prescription refills, please ask your pharmacy to contact our office. Our fax number is [phone].  If you have an urgent issue when the clinic is closed that cannot wait until the next business day, you can page your doctor at the number below.    Please note that while we do our best to be available for urgent issues outside of office hours, we are not available 24/7.   If you have an urgent issue and are unable to reach [place], you may choose to seek medical care at your doctor's office, retail clinic, urgent care center, or emergency room.  If you have a medical emergency, please immediately call 911 or go to the emergency department. In the event of inclement weather, please call our main line at [phone] for an update on the status of any delays or  closures.  Dermatology Medication Tips: Please keep the boxes that topical medications come in in order to help keep track of the instructions about where and how to use these. Pharmacies typically print the medication instructions only on the boxes and not directly on the medication tubes.   If your medication is too expensive, please contact our office at [phone] or send [place] a message through MyChart.   We are unable to tell what your co-pay for medications will be in advance as this is different depending on your insurance coverage. However, we may be able to find a substitute medication at lower cost or fill out paperwork to get insurance to cover a needed medication.   If a prior authorization is required to get your medication covered by your insurance company, please allow [place] 1-2 business days to complete this process.  Drug prices often vary depending on where the prescription is filled and some pharmacies may offer cheaper prices.  The website www.goodrx.com contains coupons for medications through different pharmacies. The prices here do not account for what the cost may be with help from insurance (it may be cheaper with your insurance), but the website can give you the price if you did not use any insurance.  - You can print the associated coupon and take it with your prescription to the pharmacy.  - You may also stop by our office during regular business hours and pick up a GoodRx coupon card.  - If  you need your prescription sent electronically to a different pharmacy, notify our office through [facility] or by phone at [phone]    Skin Education :   I counseled the patient regarding the following: Sun screen (SPF 30 or greater) should be applied during peak UV exposure (between 10am and 2pm) and reapplied after exercise or swimming.  The ABCDEs of melanoma were reviewed with the patient, and the importance of monthly self-examination of moles was emphasized.  Should any moles change in shape or color, or itch, bleed or burn, pt will contact our office for evaluation sooner then their interval appointment.  Plan: Sunscreen Recommendations I recommended a broad spectrum sunscreen with a SPF of 30 or higher. I explained that SPF 30 sunscreens block approximately 97 percent of the sun's harmful rays. Sunscreens should be applied at least 15 minutes prior to expected sun exposure and then every 2 hours after that as long as sun exposure continues. If swimming or exercising sunscreen should be reapplied every 45 minutes to an hour after getting wet or sweating. One ounce, or the equivalent of a shot glass full of sunscreen, is adequate to protect the skin not covered by a bathing suit. I also recommended a lip balm with a sunscreen as well. Sun protective clothing can be used in lieu of sunscreen but must be worn the entire time you are exposed to the sun's rays.  Patient Handout: Wound Care for Skin Biopsy Site  Taking Care of Your Skin Biopsy Site  Proper care of the biopsy site is essential for promoting healing and minimizing scarring. This handout provides instructions on how to care for your biopsy site to ensure optimal recovery.  1. Cleaning the Wound:  Clean the biopsy site daily with gentle soap and water. Gently pat the area dry with a clean, soft towel. Avoid harsh scrubbing or rubbing the area, as this can irritate the skin and delay healing.  2. Applying Aquaphor and Bandage:  After cleaning the wound, apply a thin layer of Aquaphor ointment to the biopsy site. Cover the area with a sterile bandage to protect it from dirt, bacteria, and friction. Change the bandage daily or as needed if it becomes soiled or wet.  3. Continued Care for One Week:  Repeat the cleaning, Aquaphor application, and bandaging process daily for one week following the biopsy procedure. Keeping the wound clean and moist during this initial healing period will help  prevent infection and promote optimal healing.  4. Massaging Aquaphor into the Area:  ---After one week, discontinue the use of bandages but continue to apply Aquaphor to the biopsy site. ----Gently massage the Aquaphor into the area using circular motions. ---Massaging the skin helps to promote circulation and prevent the formation of scar tissue.   Additional Tips:  Avoid exposing the biopsy site to direct sunlight during the healing process, as this can cause hyperpigmentation or worsen scarring. If you experience any signs of infection, such as increased redness, swelling, warmth, or drainage from the wound, contact your healthcare provider immediately. Follow any additional instructions provided by your healthcare provider for caring for the biopsy site and managing any discomfort. Conclusion:  Taking proper care of your skin biopsy site is crucial for ensuring optimal healing and minimizing scarring. By following these instructions for cleaning, applying Aquaphor, and massaging the area, you can promote a smooth and successful recovery. If you have any questions or concerns about caring for your biopsy site, don't hesitate to contact your healthcare  provider for guidance.  For areas treated with Liquid Nitrogen:  Keep clean with soap and water.  Apply Vaseline or Aquaphor twice daily.

## 2023-10-06 LAB — SURGICAL PATHOLOGY

## 2023-10-07 ENCOUNTER — Ambulatory Visit: Payer: Self-pay | Admitting: Dermatology

## 2023-10-12 DIAGNOSIS — H04123 Dry eye syndrome of bilateral lacrimal glands: Secondary | ICD-10-CM | POA: Diagnosis not present

## 2023-10-12 DIAGNOSIS — Z961 Presence of intraocular lens: Secondary | ICD-10-CM | POA: Diagnosis not present

## 2023-10-12 DIAGNOSIS — H353131 Nonexudative age-related macular degeneration, bilateral, early dry stage: Secondary | ICD-10-CM | POA: Diagnosis not present

## 2023-10-12 DIAGNOSIS — H2511 Age-related nuclear cataract, right eye: Secondary | ICD-10-CM | POA: Diagnosis not present

## 2023-10-31 ENCOUNTER — Emergency Department (HOSPITAL_BASED_OUTPATIENT_CLINIC_OR_DEPARTMENT_OTHER)

## 2023-10-31 ENCOUNTER — Other Ambulatory Visit: Payer: Self-pay

## 2023-10-31 ENCOUNTER — Ambulatory Visit: Payer: Self-pay

## 2023-10-31 ENCOUNTER — Encounter (HOSPITAL_BASED_OUTPATIENT_CLINIC_OR_DEPARTMENT_OTHER): Payer: Self-pay | Admitting: *Deleted

## 2023-10-31 ENCOUNTER — Emergency Department (HOSPITAL_BASED_OUTPATIENT_CLINIC_OR_DEPARTMENT_OTHER)
Admission: EM | Admit: 2023-10-31 | Discharge: 2023-10-31 | Disposition: A | Discharge status: Home / Self Care | Attending: Emergency Medicine | Admitting: Emergency Medicine

## 2023-10-31 DIAGNOSIS — R109 Unspecified abdominal pain: Secondary | ICD-10-CM | POA: Diagnosis not present

## 2023-10-31 DIAGNOSIS — Z90710 Acquired absence of both cervix and uterus: Secondary | ICD-10-CM | POA: Diagnosis not present

## 2023-10-31 DIAGNOSIS — R1084 Generalized abdominal pain: Secondary | ICD-10-CM | POA: Diagnosis present

## 2023-10-31 DIAGNOSIS — K529 Noninfective gastroenteritis and colitis, unspecified: Secondary | ICD-10-CM | POA: Diagnosis not present

## 2023-10-31 DIAGNOSIS — R188 Other ascites: Secondary | ICD-10-CM | POA: Diagnosis not present

## 2023-10-31 DIAGNOSIS — N3289 Other specified disorders of bladder: Secondary | ICD-10-CM | POA: Diagnosis not present

## 2023-10-31 DIAGNOSIS — R1013 Epigastric pain: Secondary | ICD-10-CM | POA: Diagnosis not present

## 2023-10-31 DIAGNOSIS — R9431 Abnormal electrocardiogram [ECG] [EKG]: Secondary | ICD-10-CM | POA: Diagnosis not present

## 2023-10-31 LAB — COMPREHENSIVE METABOLIC PANEL WITH GFR
ALT: 7 U/L (ref 0–44)
AST: 12 U/L — ABNORMAL LOW (ref 15–41)
Albumin: 4.6 g/dL (ref 3.5–5.0)
Alkaline Phosphatase: 85 U/L (ref 38–126)
Anion gap: 12 (ref 5–15)
BUN: 23 mg/dL (ref 8–23)
CO2: 26 mmol/L (ref 22–32)
Calcium: 10 mg/dL (ref 8.9–10.3)
Chloride: 104 mmol/L (ref 98–111)
Creatinine, Ser: 0.84 mg/dL (ref 0.44–1.00)
GFR, Estimated: >60 mL/min (ref >60)
Glucose, Bld: 100 mg/dL — ABNORMAL HIGH (ref 70–99)
Potassium: 4 mmol/L (ref 3.5–5.1)
Sodium: 142 mmol/L (ref 135–145)
Total Bilirubin: 0.3 mg/dL (ref 0.0–1.2)
Total Protein: 7.1 g/dL (ref 6.5–8.1)

## 2023-10-31 LAB — URINALYSIS, ROUTINE W REFLEX MICROSCOPIC
Bilirubin Urine: NEGATIVE
Glucose, UA: NEGATIVE mg/dL
Hgb urine dipstick: NEGATIVE
Ketones, ur: 15 mg/dL — AB
Leukocytes,Ua: NEGATIVE
Nitrite: NEGATIVE
Specific Gravity, Urine: 1.033 — ABNORMAL HIGH (ref 1.005–1.030)
pH: 5 (ref 5.0–8.0)

## 2023-10-31 LAB — CBC
HCT: 40.2 % (ref 36.0–46.0)
Hemoglobin: 13 g/dL (ref 12.0–15.0)
MCH: 29.6 pg (ref 26.0–34.0)
MCHC: 32.3 g/dL (ref 30.0–36.0)
MCV: 91.6 fL (ref 80.0–100.0)
Platelets: 241 K/uL (ref 150–400)
RBC: 4.39 MIL/uL (ref 3.87–5.11)
RDW: 13.7 % (ref 11.5–15.5)
WBC: 7.9 K/uL (ref 4.0–10.5)
nRBC: 0 % (ref 0.0–0.2)

## 2023-10-31 LAB — LIPASE, BLOOD: Lipase: 41 U/L (ref 11–51)

## 2023-10-31 LAB — TROPONIN T, HIGH SENSITIVITY: Troponin T High Sensitivity: <15 ng/L (ref <19)

## 2023-10-31 LAB — LACTIC ACID, PLASMA: Lactic Acid, Venous: 0.8 mmol/L (ref 0.5–1.9)

## 2023-10-31 MED ORDER — SODIUM CHLORIDE 0.9 % IV BOLUS
500.0000 mL | Freq: Once | INTRAVENOUS | Status: AC
  Administered 2023-10-31: 500 mL via INTRAVENOUS

## 2023-10-31 MED ORDER — AMOXICILLIN-POT CLAVULANATE 875-125 MG PO TABS
1.0000 | ORAL_TABLET | Freq: Two times a day (BID) | ORAL | 0 refills | Status: DC
Start: 2023-10-31 — End: 2024-03-08

## 2023-10-31 MED ORDER — IOHEXOL 350 MG/ML SOLN
100.0000 mL | Freq: Once | INTRAVENOUS | Status: AC | PRN
  Administered 2023-10-31: 100 mL via INTRAVENOUS

## 2023-10-31 MED ORDER — FENTANYL CITRATE PF 50 MCG/ML IJ SOSY: 25.0000 ug | PREFILLED_SYRINGE | Freq: Once | INTRAMUSCULAR | Status: DC

## 2023-10-31 NOTE — Telephone Encounter (Signed)
 FYI Only or Action Required?: FYI only for provider.  Patient was last seen in primary care on 03/03/2023 by Rollene Almarie LABOR, MD.  Called Nurse Triage reporting Abdominal Pain.  Symptoms began about 2 weeks ago.  Interventions attempted: Rest, hydration, or home remedies.  Symptoms are: gradually worsening.  Triage Disposition: Go to ED Now (or PCP Triage)  Patient/caregiver understands and will follow disposition?: Yes  Copied from CRM [id number]. Topic: Clinical - Red Word Triage >> Oct 31, 2023  3:09 PM Precious C wrote: [facility] that prompted transfer to Nurse Triage: EXTREME PAIN  Patient called in reporting extreme abdominal pain that has been ongoing for approximately two weeks. She stated that the pain is in abdomen shoots down to her navel and is currently at an 8 out of 10 on the pain scale, though it sometimes fluctuates. I transferred the patient over to nurse triage for further assistance. Reason for Disposition  Patient sounds very sick or weak to the triager  Answer Assessment - Initial Assessment Questions 1. LOCATION: Where does it hurt?      Upper abdominal pain 2. RADIATION: Does the pain shoot anywhere else? (e.g., chest, back)     no 3. ONSET: When did the pain begin? (e.g., minutes, hours or days ago)      Started two weeks ago 4. SUDDEN: Gradual or sudden onset?     Gradual 5. PATTERN Does the pain come and go, or is it constant?     constant 6. SEVERITY: How bad is the pain?  (e.g., Scale 1-10; mild, moderate, or severe)     Pain is currently mild 7. RECURRENT SYMPTOM: Have you ever had this type of stomach pain before? If Yes, ask: When was the last time? and What happened that time?      Annabell has been going on for two weeks 8. AGGRAVATING FACTORS: Does anything seem to cause this pain? (e.g., foods, stress, alcohol)     Unsure of might be causing the pain 9. CARDIAC SYMPTOMS: Do you have any of the following symptoms:  chest pain, difficulty breathing, sweating, nausea?     Patient reports an episode of sweating, nausea and abdominal pain 10. OTHER SYMPTOMS: Do you have any other symptoms? (e.g., back pain, diarrhea, fever, urination pain, vomiting)       No  Protocols used: Abdominal Pain - Upper-A-AH

## 2023-10-31 NOTE — Discharge Instructions (Addendum)
 Laboratory evaluation was overall reassuring.  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 The remaining visceral  vessels are within normal limits. No findings to suggest mesenteric  ischemia are noted.    Stable prominence of the left gonadal vein.    NON-VASCULAR    Mildly dilated loops of ileum with some edematous changes within the  wall. This likely represents some focal enteritis. No perforation is  seen.   Follow-up with your primary care provider, antibiotics have been prescribed for a mild enteritis, return for any severe worsening symptoms, concern for ability tolerate oral intake, uncontrolled nausea or vomiting or severe worsening abdominal pain.

## 2023-10-31 NOTE — ED Notes (Signed)
 Pt tolerated PO challenge

## 2023-10-31 NOTE — ED Provider Notes (Signed)
 [place] EMERGENCY DEPARTMENT AT [facility] Provider Note   CSN: [id number] Arrival date & time: 10/31/23  1736     Patient presents with: Abdominal Pain   Mikayla Washington is a 78 y.o. female.    Abdominal Pain    78 year old female with medical history significant for degenerative disc disease, colon polyps presenting to the emergency department generalized abdominal pain for the past 2 weeks.  Patient states that she has had intermittent waxing and waning abdominal pain.  She denies any triggers that she can think of, no food triggers.  She felt nauseous on Sunday, had no episode of vomiting and the nausea have subsided some.  She denies any chest pain or shortness of breath.  She endorses epigastric discomfort.  She denies any loose stools.  Her last bowel movement was this morning and was normal.  She denies any genitourinary symptoms.  Prior to Admission medications   Medication Sig Start Date End Date Taking? Authorizing Provider  amoxicillin -clavulanate (AUGMENTIN ) 875-125 MG tablet Take 1 tablet by mouth every 12 (twelve) hours. 10/31/23  Yes Jerrol Agent, MD  LYCOPENE PO Take 1 tablet by mouth daily. 09/03/19   [provider]  Multiple Vitamins-Minerals (CENTRUM SILVER PO) Take 1 tablet by mouth daily.    [provider]  Vitamin D , Ergocalciferol , (DRISDOL ) 1.25 MG (50000 UNIT) CAPS capsule Take one tablet wkly 12/09/19   Abonza, Maritza, PA-C    Allergies: Patient has no known allergies.    Review of Systems  Gastrointestinal:  Positive for abdominal pain.  All other systems reviewed and are negative.   Updated Vital Signs BP 119/63 (BP Location: Right Arm)   Pulse 91   Temp (!) 97.3 F (36.3 C) (Oral)   Resp 17   SpO2 97%   Physical Exam Vitals and nursing note reviewed.  Constitutional:      General: She is not in acute distress.    Appearance: She is well-developed.  HENT:     Head: Normocephalic and atraumatic.  Eyes:      Conjunctiva/sclera: Conjunctivae normal.  Cardiovascular:     Rate and Rhythm: Normal rate and regular rhythm.     Heart sounds: No murmur heard. Pulmonary:     Effort: Pulmonary effort is normal. No respiratory distress.     Breath sounds: Normal breath sounds.  Abdominal:     Palpations: Abdomen is soft.     Tenderness: There is abdominal tenderness in the epigastric area. There is no guarding.  Musculoskeletal:        General: No swelling.     Cervical back: Neck supple.  Skin:    General: Skin is warm and dry.     Capillary Refill: Capillary refill takes less than 2 seconds.  Neurological:     Mental Status: She is alert.  Psychiatric:        Mood and Affect: Mood normal.     (all labs ordered are listed, but only abnormal results are displayed) Labs Reviewed  COMPREHENSIVE METABOLIC PANEL WITH GFR - Abnormal; Notable for the following components:      Result Value   Glucose, Bld 100 (*)    AST 12 (*)    All other components within normal limits  URINALYSIS, ROUTINE W REFLEX MICROSCOPIC - Abnormal; Notable for the following components:   Specific Gravity, Urine 1.033 (*)    Ketones, ur 15 (*)    Protein, ur TRACE (*)    All other components within normal limits  LIPASE,  BLOOD  CBC  LACTIC ACID, PLASMA  TROPONIN T, HIGH SENSITIVITY    EKG: EKG Interpretation Date/Time:  Tuesday October 31 2023 21:11:02 EDT Ventricular Rate:  90 PR Interval:  176 QRS Duration:  80 QT Interval:  370 QTC Calculation: 453 R Axis:   79  Text Interpretation: Sinus rhythm Confirmed by Jerrol Agent ([id number]) on 10/31/2023 9:32:15 PM  Radiology: CT Angio Abd/Pel W and/or Wo Contrast Result Date: 10/31/2023 CLINICAL DATA:  Abdominal pain for 2 weeks, initial encounter EXAM: CTA ABDOMEN AND PELVIS WITHOUT AND WITH CONTRAST TECHNIQUE: Multidetector CT imaging of the abdomen and pelvis was performed using the standard protocol during bolus administration of intravenous contrast. Multiplanar  reconstructed images and MIPs were obtained and reviewed to evaluate the vascular anatomy. RADIATION DOSE REDUCTION: This exam was performed according to the departmental dose-optimization program which includes automated exposure control, adjustment of the mA and/or kV according to patient size and/or use of iterative reconstruction technique. CONTRAST:  OMNIPAQUE  IOHEXOL  350 MG/ML SOLN COMPARISON:  01/14/2021 FINDINGS: VASCULAR Aorta: Normal caliber aorta without aneurysm, dissection, vasculitis or significant stenosis. Celiac: Mild stenosis proximally with poststenotic dilatation. The overall appearance is stable from the prior exam. SMA: Patent without evidence of aneurysm, dissection, vasculitis or significant stenosis. Renals: Both renal arteries are patent without evidence of aneurysm, dissection, vasculitis, fibromuscular dysplasia or significant stenosis. IMA: Patent without evidence of aneurysm, dissection, vasculitis or significant stenosis. Inflow: Iliacs are within normal limits. Veins: No acute venous abnormality is noted. Prominence of the left gonadal vein is again identified and stable Review of the MIP images confirms the above findings. NON-VASCULAR Lower chest: Mild bibasilar scarring is seen. Hepatobiliary: No focal liver abnormality is seen. No gallstones, gallbladder wall thickening, or biliary dilatation. Pancreas: Unremarkable. No pancreatic ductal dilatation or surrounding inflammatory changes. Spleen: Normal in size without focal abnormality. Adrenals/Urinary Tract: Adrenal glands are within normal limits bilaterally. Kidneys demonstrate a normal enhancement pattern. Scattered small cysts are noted. No follow-up is recommended. No renal calculi or obstructive changes are seen. The bladder is partially distended. Stomach/Bowel: No obstructive or inflammatory changes of the colon are seen. The appendix is within normal limits. Stomach is within normal limits. Proximal small bowel is  unremarkable. The ileum demonstrates diffuse fluid throughout with mildly dilated loops of small bowel although no discrete transition zone is identified. Some edematous changes in the distal ileum are seen which may contribute to the dilatation. Lymphatic: No enlarged abdominal or pelvic lymph nodes. Reproductive: Status post hysterectomy. No adnexal masses. Other: No abdominal wall hernia or abnormality. No abdominopelvic ascites. Musculoskeletal: No acute or significant osseous findings. 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 The remaining visceral vessels are within normal limits. No findings to suggest mesenteric ischemia are noted. Stable prominence of the left gonadal vein. NON-VASCULAR Mildly dilated loops of ileum with some edematous changes within the wall. This likely represents some focal enteritis. No perforation is seen. Electronically Signed   By: Oneil Devonshire M.D.   On: 10/31/2023 21:46     Procedures   Medications Ordered in the ED  fentaNYL  (SUBLIMAZE ) injection 25 mcg (25 mcg Intravenous Not Given 10/31/23 2106)  sodium chloride  0.9 % bolus 500 mL (0 mLs Intravenous Stopped 10/31/23 2208)  iohexol  (OMNIPAQUE ) 350 MG/ML injection 100 mL (100 mLs Intravenous Contrast Given 10/31/23 2118)  Medical Decision Making Amount and/or Complexity of Data Reviewed Labs: ordered. Radiology: ordered.  Risk Prescription drug management.     78 year old female with medical history significant for degenerative disc disease, colon polyps presenting to the emergency department generalized abdominal pain for the past 2 weeks.  Patient states that she has had intermittent waxing and waning abdominal pain.  She denies any triggers that she can think of, no food triggers.  She felt nauseous on Sunday, had no episode of vomiting and the nausea have subsided some.  She denies any  chest pain or shortness of breath.  She endorses epigastric discomfort.  She denies any loose stools.  Her last bowel movement was this morning and was normal.  She denies any genitourinary symptoms.  Medical Decision Making:   Mikayla Washington is a 78 y.o. female who presented to the ED today with abdominal pain, detailed above.     Complete initial physical exam performed, notably the patient  was Generally TTP, some focality in the epigastrium.     Reviewed and confirmed nursing documentation for past medical history, family history, social history.    Initial Assessment:   With the patient's presentation of abdominal pain, most likely diagnosis is pancreatitis, chronic mesenteric ischemia, colitis, SOB. Other diagnoses were considered including (but not limited to) gastroenteritis, appendicitis, cholecystitis, pancreatitis, nephrolithiasis, UTI, pyelonephritis, diverticulitis. These are considered less likely due to history of present illness and physical exam findings.      Initial Plan:  CBC/CMP to evaluate for underlying infectious/metabolic etiology for patient's abdominal pain  Lipase to evaluate for pancreatitis  EKG to evaluate for cardiac source of pain  CTA Abd/Pelvis with contrast to evaluate for structural/surgical etiology of patients' severe abdominal pain.  Urinalysis and repeat physical assessment to evaluate for UTI/Pyelonpehritis  Empiric management of symptoms with escalating pain control and antiemetics as needed.   Initial Study Results:   Laboratory  All laboratory results reviewed without evidence of clinically relevant pathology.   Exceptions include: None urinalysis negative for UTI, cardiac troponin normal, EKG unremarkable, lipase normal, lactic acid normal, CBC without a leukocytosis or anemia, CMP generally unremarkable   EKG EKG was reviewed independently. Rate, rhythm, axis, intervals all examined and without medically relevant abnormality. ST segments without  concerns for elevations.    Radiology All images reviewed independently. Agree with radiology report at this time.   CT Angio Abd/Pel W and/or Wo Contrast Result Date: 10/31/2023 CLINICAL DATA:  Abdominal pain for 2 weeks, initial encounter EXAM: CTA ABDOMEN AND PELVIS WITHOUT AND WITH CONTRAST TECHNIQUE: Multidetector CT imaging of the abdomen and pelvis was performed using the standard protocol during bolus administration of intravenous contrast. Multiplanar reconstructed images and MIPs were obtained and reviewed to evaluate the vascular anatomy. RADIATION DOSE REDUCTION: This exam was performed according to the departmental dose-optimization program which includes automated exposure control, adjustment of the mA and/or kV according to patient size and/or use of iterative reconstruction technique. CONTRAST:  OMNIPAQUE  IOHEXOL  350 MG/ML SOLN COMPARISON:  01/14/2021 FINDINGS: VASCULAR Aorta: Normal caliber aorta without aneurysm, dissection, vasculitis or significant stenosis. Celiac: Mild stenosis proximally with poststenotic dilatation. The overall appearance is stable from the prior exam. SMA: Patent without evidence of aneurysm, dissection, vasculitis or significant stenosis. Renals: Both renal arteries are patent without evidence of aneurysm, dissection, vasculitis, fibromuscular dysplasia or significant stenosis. IMA: Patent without evidence of aneurysm, dissection, vasculitis or significant stenosis. Inflow: Iliacs are within normal limits. Veins: No acute venous abnormality is noted. Prominence  of the left gonadal vein is again identified and stable Review of the MIP images confirms the above findings. NON-VASCULAR Lower chest: Mild bibasilar scarring is seen. Hepatobiliary: No focal liver abnormality is seen. No gallstones, gallbladder wall thickening, or biliary dilatation. Pancreas: Unremarkable. No pancreatic ductal dilatation or surrounding inflammatory changes. Spleen: Normal in size  without focal abnormality. Adrenals/Urinary Tract: Adrenal glands are within normal limits bilaterally. Kidneys demonstrate a normal enhancement pattern. Scattered small cysts are noted. No follow-up is recommended. No renal calculi or obstructive changes are seen. The bladder is partially distended. Stomach/Bowel: No obstructive or inflammatory changes of the colon are seen. The appendix is within normal limits. Stomach is within normal limits. Proximal small bowel is unremarkable. The ileum demonstrates diffuse fluid throughout with mildly dilated loops of small bowel although no discrete transition zone is identified. Some edematous changes in the distal ileum are seen which may contribute to the dilatation. Lymphatic: No enlarged abdominal or pelvic lymph nodes. Reproductive: Status post hysterectomy. No adnexal masses. Other: No abdominal wall hernia or abnormality. No abdominopelvic ascites. Musculoskeletal: No acute or significant osseous findings. 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 The remaining visceral vessels are within normal limits. No findings to suggest mesenteric ischemia are noted. Stable prominence of the left gonadal vein. NON-VASCULAR Mildly dilated loops of ileum with some edematous changes within the wall. This likely represents some focal enteritis. No perforation is seen. Electronically Signed   By: Oneil Devonshire M.D.   On: 10/31/2023 21:46       Final Reassessment and Plan:   Following fluid bolus, the patient was feeling symptomatically improved.  She was tolerating oral intake and passed a p.o. challenge.  Will discharge her on a course of Augmentin  for the mild colitis seen on CT, advised outpatient follow-up with her PCP, return precautions provided.      Final diagnoses:  Enteritis    ED Discharge Orders          Ordered    amoxicillin -clavulanate (AUGMENTIN ) 875-125 MG tablet   Every 12 hours        10/31/23 2256               Jerrol Agent, MD 10/31/23 2314

## 2023-10-31 NOTE — ED Notes (Signed)
 Provided PO water to patient per MD request.

## 2023-10-31 NOTE — ED Notes (Signed)
 Patient transported to CT

## 2023-10-31 NOTE — ED Triage Notes (Signed)
 Pt to ED POV reporting generalized abd pain x 2 weeks. Pt reports one episode of Sunday of feeling nauseous but no vomiting and it has since subsided. No fevers.

## 2023-11-06 DIAGNOSIS — H2511 Age-related nuclear cataract, right eye: Secondary | ICD-10-CM | POA: Diagnosis not present

## 2023-12-26 ENCOUNTER — Telehealth: Payer: Self-pay | Admitting: Radiology

## 2023-12-26 NOTE — Telephone Encounter (Signed)
 Copied from CRM [id number]. Topic: Clinical - Medication Question >> Dec 26, 2023 12:59 PM Rea ORN wrote: Reason for CRM: Pt calling to request an rx for covid vaccine be sent to [facility] [address] [place], [place] [address]

## 2023-12-27 ENCOUNTER — Telehealth: Payer: Self-pay | Admitting: Internal Medicine

## 2023-12-27 MED ORDER — COVID-19 MRNA VACC (MODERNA) 50 MCG/0.5ML IM SUSP: 0.5000 mL | Freq: Once | INTRAMUSCULAR | 0 refills | Status: AC

## 2023-12-27 NOTE — Addendum Note (Signed)
 Addended by: ROLLENE NORRIS A on: 12/27/2023 09:02 AM   Modules accepted: Orders

## 2023-12-27 NOTE — Telephone Encounter (Signed)
 Copied from CRM [id number]. Topic: Clinical - Medication Question >> Dec 26, 2023 12:59 PM Rea ORN wrote: Reason for CRM: Pt calling to request an rx for covid vaccine be [facility] to [facility] [address] [place], [place] [address] >> Dec 27, 2023 12:36 PM Robinson H wrote: Patient states she went in to the pharmacy to get the covid vaccine and was told by the pharmacy that office sent over a request for a covid test and not the vaccine, advised patient per the system the prescription states it's for a vaccine.  [facility] [phone]

## 2023-12-27 NOTE — Telephone Encounter (Signed)
 Pt called in and stated that per the pharmacist, Dr. Rollene sent in the test and not the vaccine. They need a new rx for comirnaty 2025-26 Covid-19 sent to [facility]. Please call and advise.

## 2023-12-27 NOTE — Telephone Encounter (Signed)
Duplicative encounter.

## 2023-12-27 NOTE — Telephone Encounter (Signed)
 but I just spoke with the pharmacy and they stated that when sending in the vaccine it has to  be typed in as Comirnaty pfizer and not spikevax because the spikevax is populating in there system as the covid test and not the vaccine

## 2023-12-27 NOTE — Telephone Encounter (Signed)
 We do not have the option for the new 25-26 vaccine and this will not be built into epic until end of month so we are not able to change in our system

## 2023-12-27 NOTE — Telephone Encounter (Signed)
 Sent in

## 2023-12-29 NOTE — Telephone Encounter (Signed)
 Called patient and LVM very detailed stating that the provider is not able to send in due to her not having this built into the sytem and to give our office a call back the first of October and hopefully by then this will be fixed

## 2024-01-02 DIAGNOSIS — H2511 Age-related nuclear cataract, right eye: Secondary | ICD-10-CM | POA: Diagnosis not present

## 2024-02-27 ENCOUNTER — Ambulatory Visit

## 2024-03-07 ENCOUNTER — Encounter: Payer: Medicare PPO | Admitting: Internal Medicine

## 2024-03-07 DIAGNOSIS — Z1231 Encounter for screening mammogram for malignant neoplasm of breast: Secondary | ICD-10-CM | POA: Diagnosis not present

## 2024-03-07 LAB — HM MAMMOGRAPHY

## 2024-03-08 ENCOUNTER — Encounter: Payer: Self-pay | Admitting: Internal Medicine

## 2024-03-08 ENCOUNTER — Ambulatory Visit: Admitting: Internal Medicine

## 2024-03-08 VITALS — BP 110/60 | HR 83 | Temp 98.2°F | Ht 64.0 in | Wt 124.2 lb

## 2024-03-08 DIAGNOSIS — Z0000 Encounter for general adult medical examination without abnormal findings: Secondary | ICD-10-CM

## 2024-03-08 DIAGNOSIS — E559 Vitamin D deficiency, unspecified: Secondary | ICD-10-CM

## 2024-03-08 DIAGNOSIS — M810 Age-related osteoporosis without current pathological fracture: Secondary | ICD-10-CM

## 2024-03-08 LAB — CBC
HCT: 39.2 % (ref 36.0–46.0)
Hemoglobin: 12.9 g/dL (ref 12.0–15.0)
MCHC: 33.1 g/dL (ref 30.0–36.0)
MCV: 89.8 fl (ref 78.0–100.0)
Platelets: 223 K/uL (ref 150.0–400.0)
RBC: 4.36 Mil/uL (ref 3.87–5.11)
RDW: 14.4 % (ref 11.5–15.5)
WBC: 6.9 K/uL (ref 4.0–10.5)

## 2024-03-08 LAB — COMPREHENSIVE METABOLIC PANEL WITH GFR
ALT: 8 U/L (ref 0–35)
AST: 12 U/L (ref 0–37)
Albumin: 4.3 g/dL (ref 3.5–5.2)
Alkaline Phosphatase: 80 U/L (ref 39–117)
BUN: 20 mg/dL (ref 6–23)
CO2: 29 meq/L (ref 19–32)
Calcium: 9.3 mg/dL (ref 8.4–10.5)
Chloride: 107 meq/L (ref 96–112)
Creatinine, Ser: 0.65 mg/dL (ref 0.40–1.20)
GFR: 84.45 mL/min (ref >60.00)
Glucose, Bld: 92 mg/dL (ref 70–99)
Potassium: 4.4 meq/L (ref 3.5–5.1)
Sodium: 142 meq/L (ref 135–145)
Total Bilirubin: 0.5 mg/dL (ref 0.2–1.2)
Total Protein: 6.6 g/dL (ref 6.0–8.3)

## 2024-03-08 LAB — LIPID PANEL
Cholesterol: 168 mg/dL (ref 0–200)
HDL: 60.4 mg/dL (ref >39.00)
LDL Cholesterol: 94 mg/dL (ref 0–99)
NonHDL: 108
Total CHOL/HDL Ratio: 3
Triglycerides: 71 mg/dL (ref 0.0–149.0)
VLDL: 14.2 mg/dL (ref 0.0–40.0)

## 2024-03-08 LAB — VITAMIN D 25 HYDROXY (VIT D DEFICIENCY, FRACTURES): VITD: 26.59 ng/mL — ABNORMAL LOW (ref 30.00–100.00)

## 2024-03-08 NOTE — Assessment & Plan Note (Signed)
 Ordered DEXA for follow up. Not on meds currently no fracture currently.

## 2024-03-08 NOTE — Assessment & Plan Note (Signed)
 Checking vitamin D  and adjust as needed.

## 2024-03-08 NOTE — Patient Instructions (Signed)
 We will send the order for the bone density to [facility] so they will call you to schedule.

## 2024-03-08 NOTE — Progress Notes (Signed)
   Subjective:   Patient ID: Mikayla Washington, female    DOB: 1945-06-02, 78 y.o.   MRN: [id number]  The patient is here for physical. Pertinent topics discussed: Discussed the use of AI scribe software for clinical note transcription with the patient, who gave verbal consent to proceed.  History of Present Illness Mikayla Washington is a 78 year old female with macular degeneration who presents for a routine follow-up visit.  She recently underwent her second cataract surgery and subsequently experienced persistent fuzziness in her vision, leading to a diagnosis of macular degeneration. She is currently taking ARED[employer] supplements for eye health, using the [employer] brand.  She has ongoing arthritic pain, particularly in her hip and shoulder, which were previously fractured. The pain varies daily.  She experiences varicose veins that cause discomfort, especially at night, and sometimes this keeps her from sleeping. 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  She has a history of lumps since her early twenties, none of which have been cancerous. She recently had a mammogram and opted for an additional calcium  test.  She mentions a persistent skin lesion that has not changed in appearance. She has a history of visiting a dermatologist for a skin treatment, which resolved the issue.  No new chest pain, tightness, pressure, heart racing, breathing troubles, stomach issues, headaches, migraines, or swelling in feet or ankles. She has never smoked but was exposed to secondhand smoke in the past. She has ongoing issues with diarrhea and constipation without significant changes.  PMH, [facility], social history reviewed and updated  Review of Systems  Constitutional: Negative.   HENT: Negative.    Eyes: Negative.   Respiratory:  Negative for cough, chest tightness and shortness of breath.   Cardiovascular:  Negative for chest pain, palpitations and leg swelling.   Gastrointestinal:  Negative for abdominal distention, abdominal pain, constipation, diarrhea, nausea and vomiting.  Musculoskeletal: Negative.   Skin: Negative.   Neurological: Negative.   Psychiatric/Behavioral: Negative.      Objective:  Physical Exam Constitutional:      Appearance: She is well-developed.  HENT:     Head: Normocephalic and atraumatic.  Cardiovascular:     Rate and Rhythm: Normal rate and regular rhythm.  Pulmonary:     Effort: Pulmonary effort is normal. No respiratory distress.     Breath sounds: Normal breath sounds. No wheezing or rales.  Abdominal:     General: Bowel sounds are normal. There is no distension.     Palpations: Abdomen is soft.     Tenderness: There is no abdominal tenderness.  Musculoskeletal:     Cervical back: Normal range of motion.  Skin:    General: Skin is warm and dry.  Neurological:     Mental Status: She is alert and oriented to person, place, and time.     Coordination: Coordination normal.     Vitals:   03/08/24 0902  BP: 110/60  Pulse: 83  Temp: 98.2 F (36.8 C)  TempSrc: Oral  SpO2: 98%  Weight: 124 lb 3.2 oz (56.3 kg)  Height: 5' 4 (1.626 m)    Assessment & Plan:

## 2024-03-08 NOTE — Assessment & Plan Note (Signed)
 Flu shot yearly. Pneumonia complete. Shingrix complete. Tetanus up to date. Colonoscopy aged out. Mammogram aged out, pap smear aged out and dexa due ordered. Counseled about sun safety and mole surveillance. Counseled about the dangers of distracted driving. Given 10 year screening recommendations.

## 2024-03-11 ENCOUNTER — Ambulatory Visit: Payer: Self-pay | Admitting: Internal Medicine

## 2024-03-11 ENCOUNTER — Encounter: Payer: Self-pay | Admitting: Internal Medicine

## 2024-04-02 ENCOUNTER — Ambulatory Visit

## 2024-04-02 VITALS — BP 118/78 | HR 76 | Ht 64.0 in | Wt 124.8 lb

## 2024-04-02 DIAGNOSIS — Z0000 Encounter for general adult medical examination without abnormal findings: Secondary | ICD-10-CM

## 2024-04-02 NOTE — Patient Instructions (Addendum)
 Mikayla Washington,  Thank you for taking the time for your Medicare Wellness Visit. I appreciate your continued commitment to your health goals. Please review the care plan we discussed, and feel free to reach out if I can assist you further.  Please note that Annual Wellness Visits do not include a physical exam. Some assessments may be limited, especially if the visit was conducted virtually. If needed, we may recommend an in-person follow-up with your provider.  Ongoing Care Seeing your primary care provider every 3 to 6 months helps us  monitor your health and provide consistent, personalized care.   Referrals If a referral was made during today's visit and you haven't received any updates within two weeks, please contact the referred provider directly to check on the status.  Recommended Screenings:  Health Maintenance  Topic Date Due   COVID-19 Vaccine (8 - Mixed Product risk 2025-26 season) 07/10/2024   Medicare Annual Wellness Visit  04/02/2025   DTaP/Tdap/Td vaccine (3 - Td or Tdap) 03/24/2026   Pneumococcal Vaccine for age over 33  Completed   Flu Shot  Completed   Osteoporosis screening with Bone Density Scan  Completed   Hepatitis C Screening  Completed   Zoster (Shingles) Vaccine  Completed   Meningitis B Vaccine  Aged Out   Breast Cancer Screening  Discontinued   Colon Cancer Screening  Discontinued       04/02/2024   10:44 AM  Advanced Directives  Does Patient Have a Medical Advance Directive? Yes  Type of Estate Agent of [place];Living will  Does patient want to make changes to medical advance directive? Yes (Inpatient - patient requests chaplain consult to change a medical advance directive)  Copy of Healthcare Power of Attorney in Chart? No - copy requested    Vision: Annual vision screenings are recommended for early detection of glaucoma, cataracts, and diabetic retinopathy. These exams can also reveal signs of chronic conditions such as  diabetes and high blood pressure.  Dental: Annual dental screenings help detect early signs of oral cancer, gum disease, and other conditions linked to overall health, including heart disease and diabetes.

## 2024-04-02 NOTE — Progress Notes (Signed)
 Chief Complaint  Patient presents with   Medicare Wellness     Subjective:   Mikayla Washington is a 78 y.o. female who presents for a Medicare Annual Wellness Visit.  Visit info / Clinical Intake: Medicare Wellness Visit Type:: Subsequent Annual Wellness Visit Persons participating in visit and providing information:: patient Medicare Wellness Visit Mode:: In-person (required for WTM) Interpreter Needed?: No Pre-visit prep was completed: yes AWV questionnaire completed by patient prior to visit?: no Living arrangements:: lives with spouse/significant other Patient's Overall Health Status Rating: good Typical amount of pain: none Does pain affect daily life?: no Are you currently prescribed opioids?: no  Dietary Habits and Nutritional Risks How many meals a day?: 3 Eats fruit and vegetables daily?: yes Most meals are obtained by: preparing own meals In the last 2 weeks, have you had any of the following?: none Diabetic:: no  Functional Status Activities of Daily Living (to include ambulation/medication): Independent Ambulation: Independent with device- listed below Home Assistive Devices/Equipment: Eyeglasses Medication Administration: Independent Home Management (perform basic housework or laundry): Independent Manage your own finances?: yes Primary transportation is: driving Concerns about vision?: no *vision screening is required for WTM* Concerns about hearing?: (!) yes Uses hearing aids?: no Hear whispered voice?: (!) no *in-person visit only*  Fall Screening Falls in the past year?: 0 Number of falls in past year: 0 Was there an injury with Fall?: 0 Fall Risk Category Calculator: 0 Patient Fall Risk Level: Low Fall Risk  Fall Risk Patient at Risk for Falls Due to: No Fall Risks Fall risk Follow up: Falls evaluation completed; Falls prevention discussed  Home and Transportation Safety: All rugs have non-skid backing?: N/A, no rugs All stairs or steps have  railings?: N/A, no stairs Grab bars in the bathtub or shower?: yes Have non-skid surface in bathtub or shower?: yes Good home lighting?: yes Regular seat belt use?: yes Hospital stays in the last year:: no  Cognitive Assessment Difficulty concentrating, remembering, or making decisions? : no Will 6CIT or Mini Cog be Completed: yes What year is it?: 0 points What month is it?: 0 points Give patient an address phrase to remember (5 components): [address] [place], [place] About what time is it?: 0 points Count backwards from 20 to 1: 0 points Say the months of the year in reverse: 0 points Repeat the address phrase from earlier: 2 points (Drive) 6 CIT Score: 2 points  Advance Directives (For Healthcare) Does Patient Have a Medical Advance Directive?: Yes Does patient want to make changes to medical advance directive?: Yes (Inpatient - patient requests chaplain consult to change a medical advance directive) Type of Advance Directive: Healthcare Power of [place]; Living will Copy of Healthcare Power of Attorney in Chart?: No - copy requested Copy of Living Will in Chart?: No - copy requested Would patient like information on creating a medical advance directive?: No - Patient declined  Reviewed/Updated  Reviewed/Updated: Reviewed All (Medical, Surgical, Family, Medications, Allergies, Care Teams, Patient Goals)    Allergies (verified) Patient has no known allergies.   Current Medications (verified) Outpatient Encounter Medications as of 04/02/2024  Medication Sig   LYCOPENE PO Take 1 tablet by mouth daily.   Multiple Vitamins-Minerals (CENTRUM SILVER PO) Take 1 tablet by mouth daily.   Vitamin D , Ergocalciferol , (DRISDOL ) 1.25 MG (50000 UNIT) CAPS capsule Take one tablet wkly   No facility-administered encounter medications on file as of 04/02/2024.    History: Past Medical History:  Diagnosis Date   Adenomatous  colon polyp 04/19/2011   Dr [name]   Breast disease     fibrocystic   DJD (degenerative joint disease)    Mitral valve prolapse    Osteopenia    Past Surgical History:  Procedure Laterality Date   ABDOMINAL HYSTERECTOMY     ANTERIOR AND POSTERIOR VAGINAL REPAIR     BREAST LUMPECTOMY     X 3   CATARACT EXTRACTION Left    COLONOSCOPY  2002   Dr [name], negative   colonoscopy with polypectomy  2013   adenomatous polyp   RECTOCELE REPAIR     TONSILLECTOMY     TOTAL ABDOMINAL HYSTERECTOMY W/ BILATERAL SALPINGOOPHORECTOMY  2001    benign ovarian tumor (BSO also)   Family History  Adopted: Yes   Social History   Occupational History   Occupation: Retired Runner, Broadcasting/film/video  Tobacco Use   Smoking status: Never   Smokeless tobacco: Never  Vaping Use   Vaping status: Never Used  Substance and Sexual Activity   Alcohol use: No    Comment: rare, 1 x per year or at celebrations    Drug use: No   Sexual activity: Yes    Birth control/protection: Surgical   Tobacco Counseling Counseling given: No  SDOH Screenings   Food Insecurity: No Food Insecurity (04/02/2024)  Housing: Unknown (04/02/2024)  Transportation Needs: No Transportation Needs (04/02/2024)  Utilities: Not At Risk (04/02/2024)  Alcohol Screen: Low Risk (12/27/2021)  Depression (PHQ2-9): Low Risk (04/02/2024)  Financial Resource Strain: Low Risk (03/07/2024)  Physical Activity: Sufficiently Active (04/02/2024)  Social Connections: Socially Integrated (04/02/2024)  Stress: No Stress Concern Present (04/02/2024)  Tobacco Use: Low Risk (04/02/2024)  Health Literacy: Adequate Health Literacy (04/02/2024)   See flowsheets for full screening details  Depression Screen PHQ 2 & 9 Depression Scale- Over the past 2 weeks, how often have you been bothered by any of the following problems? Little interest or pleasure in doing things: 0 Feeling down, depressed, or hopeless (PHQ Adolescent also includes...irritable): 0 PHQ-2 Total Score: 0 Trouble falling or staying asleep, or  sleeping too much: 0 Feeling tired or having little energy: 0 Poor appetite or overeating (PHQ Adolescent also includes...weight loss): 0 Feeling bad about yourself - or that you are a failure or have let yourself or your family down: 0 Trouble concentrating on things, such as reading the newspaper or watching television (PHQ Adolescent also includes...like school work): 0 Moving or speaking so slowly that other people could have noticed. 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  Hearing/Vision screen Hearing Screening - Comments:: C/o hearing concerns - Tinnitus &gt;plans to schedule an appt w/[facility] - Audiology dept Vision Screening - Comments:: Wears eyeglasses for - up to date with routine eye exams with Elspeth Aran Immunizations and Health Maintenance Health Maintenance  Topic Date Due   COVID-19 Vaccine (8 - Mixed Product risk 2025-26 season) 07/10/2024   Medicare Annual Wellness (AWV)  04/02/2025   DTaP/Tdap/Td (3 - Td or Tdap) 03/24/2026   Pneumococcal Vaccine: 50+ Years  Completed   Influenza Vaccine  Completed   Bone Density Scan  Completed   Hepatitis C  Screening  Completed   Zoster Vaccines- Shingrix  Completed   Meningococcal B Vaccine  Aged Out   Mammogram  Discontinued   Colonoscopy  Discontinued        Assessment/Plan:  This is a routine wellness examination for [place].  Patient Care Team: Rollene Almarie LABOR, MD as PCP - General (Internal Medicine) Kennyth Cy RAMAN, DO as Consulting Physician (Ophthalmology) Aran Elspeth PARAS, MD as Consulting Physician (Ophthalmology) Albertus, Gordy HERO, MD as Consulting Physician (Gastroenterology)  I have personally reviewed and noted the following in the patients chart:   Medical and social history Use of alcohol, tobacco or illicit drugs  Current medications and supplements including opioid prescriptions. Functional ability and status Nutritional status Physical activity Advanced directives List of other physicians Hospitalizations, surgeries, and ER visits in previous 12 months Vitals Screenings to include cognitive, depression, and falls Referrals and appointments  No orders of the defined types were placed in this encounter.  In addition, I have reviewed and discussed with patient certain preventive protocols, quality metrics, and best practice recommendations. A written personalized care plan for preventive services as well as general preventive health recommendations were provided to patient.   Verdie HERO Saba, CMA   04/02/2024   Return in 1 year (on 04/02/2025).  After Visit Summary: (In Person-Declined) Patient declined AVS at this time.  Nurse Notes: Appointment(s) made: (scheduled 2026 AWV/CPE appts)

## 2024-04-03 ENCOUNTER — Encounter: Payer: Self-pay | Admitting: Internal Medicine

## 2024-04-03 ENCOUNTER — Ambulatory Visit: Admitting: Internal Medicine

## 2024-04-03 VITALS — BP 118/62 | HR 83 | Temp 98.3°F | Ht 64.0 in | Wt 124.0 lb

## 2024-04-03 DIAGNOSIS — J069 Acute upper respiratory infection, unspecified: Secondary | ICD-10-CM | POA: Diagnosis not present

## 2024-04-03 MED ORDER — AMOXICILLIN-POT CLAVULANATE 875-125 MG PO TABS: 1.0000 | ORAL_TABLET | Freq: Two times a day (BID) | ORAL | 0 refills | Status: AC

## 2024-04-03 NOTE — Patient Instructions (Addendum)
° ° ° °  Medications changes include :   Augmentin  twice daily for one week   You can try taking claritin  to dry up some of the mucus.      Return if symptoms worsen or fail to improve.

## 2024-04-03 NOTE — Progress Notes (Signed)
 Subjective:    Patient ID: Mikayla Washington, female    DOB: [birth year], 78 y.o.   MRN: [id number]      HPI Mikayla Washington is here for  Chief Complaint  Patient presents with   Cough    cough/drainage sinus pressure 10 days    Discussed the use of AI scribe software for clinical note transcription with the patient, who gave verbal consent to proceed.  History of Present Illness Mikayla Washington is a 78 year old female who presents with over a week of runny nose and cough.  She has been experiencing a runny nose and productive cough for over a week, accompanied by congestion, headaches, and sinus pressure in the nose and head area. No fever, ear pain, sore throat, shortness of breath, wheezing, lightheadedness, dizziness, or gastrointestinal symptoms such as diarrhea or nausea.  The symptoms have impacted her daily activities. She also experiences nighttime congestion, leading to frequent awakenings and needing to expectorate mucus.  She has not taken any over-the-counter medications recently, although she has used a store brand cold and congestion medication in the past. She has not taken Tylenol and prefers to avoid it unless necessary.  She previously had recurrent bronchitis, particularly in the autumn and spring, but has not experienced bronchitis since starting COVID inoculations.          Medications and allergies reviewed with patient and updated if appropriate.  Medications Ordered Prior to Encounter[1]  Review of Systems  Constitutional:  Negative for fever.  HENT:  Positive for congestion, postnasal drip, rhinorrhea, sinus pressure and sore throat (irritation - no soreness). Negative for ear pain.   Respiratory:  Positive for cough (some productivity). Negative for shortness of breath and wheezing.   Gastrointestinal:  Negative for diarrhea and nausea.  Musculoskeletal:  Negative for myalgias.  Neurological:  Positive for headaches. Negative for dizziness and light-headedness.        Objective:   Vitals:   04/03/24 1031  BP: 118/62  Pulse: 83  Temp: 98.3 F (36.8 C)  SpO2: 97%   BP Readings from Last 3 Encounters:  04/03/24 118/62  04/02/24 118/78  03/08/24 110/60   Wt Readings from Last 3 Encounters:  04/03/24 124 lb (56.2 kg)  04/02/24 124 lb 12.8 oz (56.6 kg)  03/08/24 124 lb 3.2 oz (56.3 kg)   Body mass index is 21.28 kg/m.    Physical Exam Constitutional:      General: She is not in acute distress.    Appearance: Normal appearance. She is not ill-appearing.  HENT:     Head: Normocephalic and atraumatic.     Right Ear: Tympanic membrane, ear canal and external ear normal.     Left Ear: Tympanic membrane, ear canal and external ear normal.     Mouth/Throat:     Mouth: Mucous membranes are moist.     Pharynx: No oropharyngeal exudate or posterior oropharyngeal erythema.  Eyes:     Conjunctiva/sclera: Conjunctivae normal.  Cardiovascular:     Rate and Rhythm: Normal rate and regular rhythm.  Pulmonary:     Effort: Pulmonary effort is normal. No respiratory distress.     Breath sounds: Normal breath sounds. No wheezing or rales.  Musculoskeletal:     Cervical back: Neck supple. No tenderness.  Lymphadenopathy:     Cervical: No cervical adenopathy.  Skin:    General: Skin is warm and dry.  Neurological:     Mental Status: She is alert.  Assessment & Plan:     Assessment and Plan Assessment & Plan Acute upper respiratory infection Symptoms include rhinorrhea, productive cough, headache, congestion, and sinus pressure. Differential includes sinusitis and bronchitis. Lungs clear. No fever, sore throat, shortness of breath, or wheezing. Negative COVID test. - Prescribed Augmentin  875-125 mg twice daily x 1 week. - Recommended Claritin  or Zyrtec. - Suggested Flonase  nasal spray. - Advised increased fluid intake. - Discussed Mucinex use. - Consider Sudafed for congestion with caution.        [1]  Current  Outpatient Medications on File Prior to Visit  Medication Sig Dispense Refill   LYCOPENE PO Take 1 tablet by mouth daily.     Multiple Vitamins-Minerals (CENTRUM SILVER PO) Take 1 tablet by mouth daily.     Vitamin D , Ergocalciferol , (DRISDOL ) 1.25 MG (50000 UNIT) CAPS capsule Take one tablet wkly 12 capsule 3   No current facility-administered medications on file prior to visit.

## 2024-10-08 ENCOUNTER — Ambulatory Visit: Admitting: Dermatology

## 2025-03-21 ENCOUNTER — Ambulatory Visit: Admitting: Internal Medicine

## 2025-04-09 ENCOUNTER — Ambulatory Visit

## 2025-04-09 ENCOUNTER — Encounter: Admitting: Internal Medicine
